# Patient Record
Sex: Female | Born: 1980 | Hispanic: Yes | Marital: Married | Smoking: Never smoker
Health system: Southern US, Community
[De-identification: ages and names within clinical notes are randomized; demographics above are authoritative.]

## PROBLEM LIST (undated history)

## (undated) ENCOUNTER — Inpatient Hospital Stay (HOSPITAL_COMMUNITY): Payer: Self-pay

## (undated) DIAGNOSIS — O24419 Gestational diabetes mellitus in pregnancy, unspecified control: Secondary | ICD-10-CM

## (undated) DIAGNOSIS — K429 Umbilical hernia without obstruction or gangrene: Secondary | ICD-10-CM

## (undated) DIAGNOSIS — Z8632 Personal history of gestational diabetes: Secondary | ICD-10-CM

## (undated) HISTORY — DX: Personal history of gestational diabetes: Z86.32

## (undated) HISTORY — PX: NO PAST SURGERIES: SHX2092

## (undated) HISTORY — DX: Gestational diabetes mellitus in pregnancy, unspecified control: O24.419

---

## 2010-12-04 ENCOUNTER — Other Ambulatory Visit: Payer: Self-pay | Admitting: Obstetrics & Gynecology

## 2010-12-04 ENCOUNTER — Inpatient Hospital Stay (HOSPITAL_COMMUNITY): Payer: Self-pay

## 2010-12-04 ENCOUNTER — Inpatient Hospital Stay (HOSPITAL_COMMUNITY)
Admission: AD | Admit: 2010-12-04 | Discharge: 2010-12-04 | Disposition: A | Discharge status: Home / Self Care | Payer: Self-pay | Source: Ambulatory Visit | Attending: Obstetrics & Gynecology | Admitting: Obstetrics & Gynecology

## 2010-12-04 DIAGNOSIS — O209 Hemorrhage in early pregnancy, unspecified: Secondary | ICD-10-CM

## 2010-12-04 DIAGNOSIS — R102 Pelvic and perineal pain unspecified side: Secondary | ICD-10-CM

## 2010-12-04 DIAGNOSIS — O26899 Other specified pregnancy related conditions, unspecified trimester: Secondary | ICD-10-CM

## 2010-12-04 DIAGNOSIS — O039 Complete or unspecified spontaneous abortion without complication: Secondary | ICD-10-CM | POA: Insufficient documentation

## 2010-12-04 LAB — CBC
Hemoglobin: 14.8 g/dL (ref 12.0–15.0)
Platelets: 137 10*3/uL — ABNORMAL LOW (ref 150–400)
RBC: 4.43 MIL/uL (ref 3.87–5.11)
WBC: 11.7 10*3/uL — ABNORMAL HIGH (ref 4.0–10.5)

## 2010-12-04 LAB — HCG, QUANTITATIVE, PREGNANCY: hCG, Beta Chain, Quant, S: 3444 m[IU]/mL — ABNORMAL HIGH (ref <5)

## 2010-12-04 LAB — ABO/RH: ABO/RH(D): B POS

## 2010-12-04 IMAGING — US US OB TRANSVAGINAL
1 series · 14 of 28 positions shown · non-contrast
Comparison: None

CLINICAL DATA: *Abdominal pain Pregnancy Bleeding,bleeding, pain; ;

OBSTETRIC <14 WK US AND TRANSVAGINAL OB US
TECHNIQUE: Both transabdominal and transvaginal ultrasound
examinations were performed for complete evaluation of the
gestation as well as the maternal uterus, adnexal regions, and
pelvic cul-de-sac.

[Series 1: us ob comp less 14 wks · 14 of 48 slices shown]
[im 2/48]
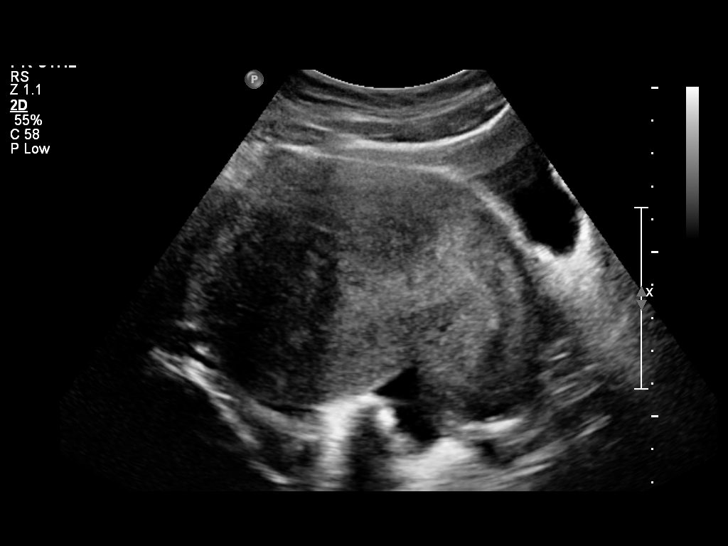
[im 6/48]
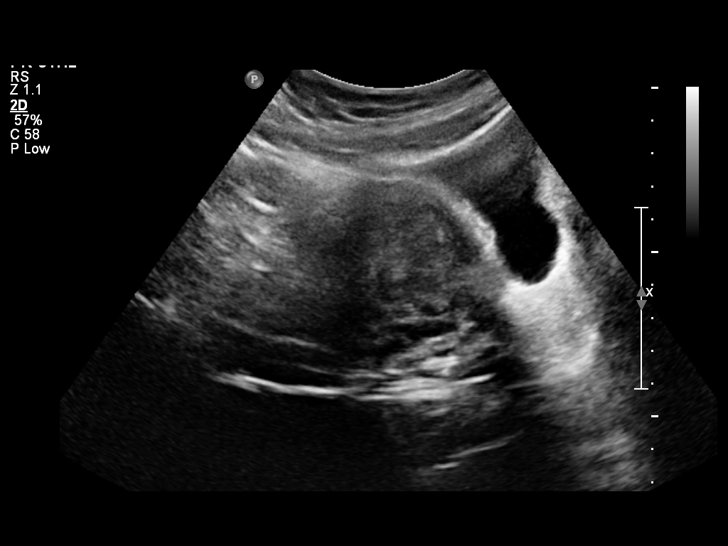
[im 9/48]
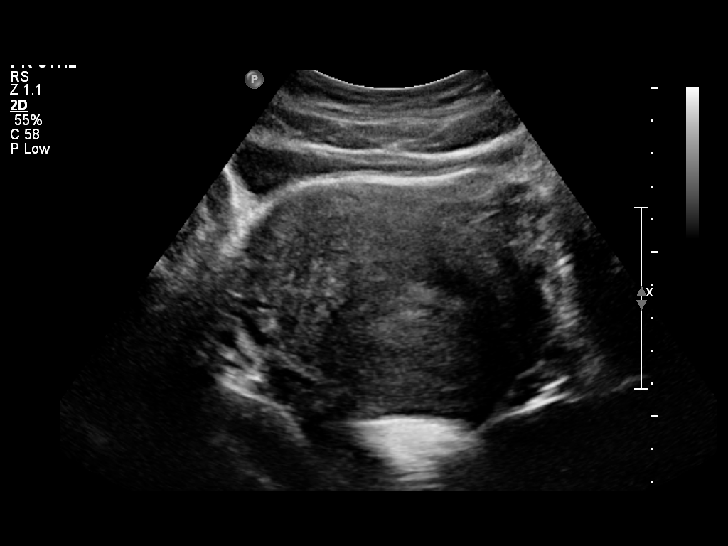
[im 13/48]
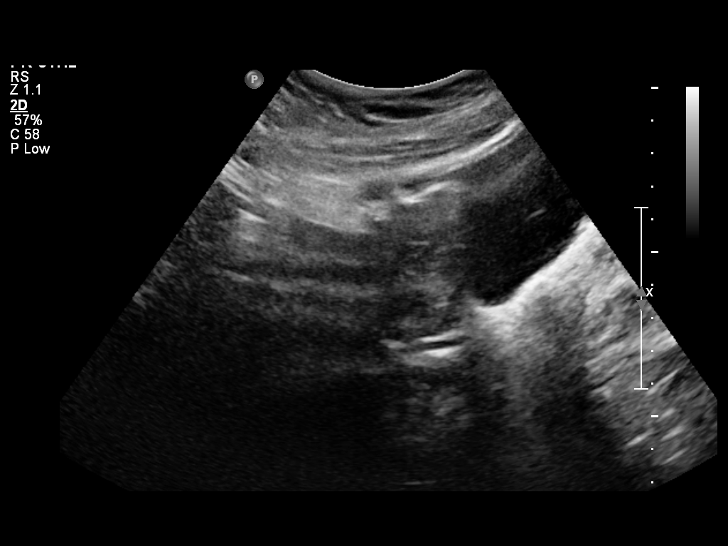
[im 16/48]
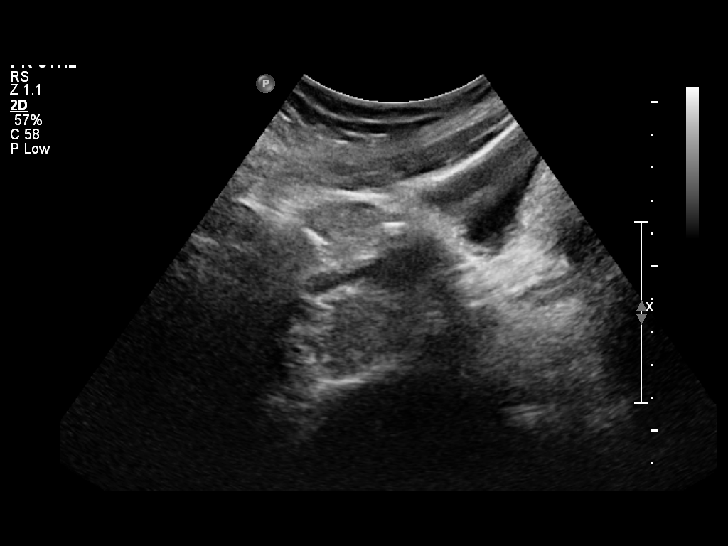
[im 20/48]
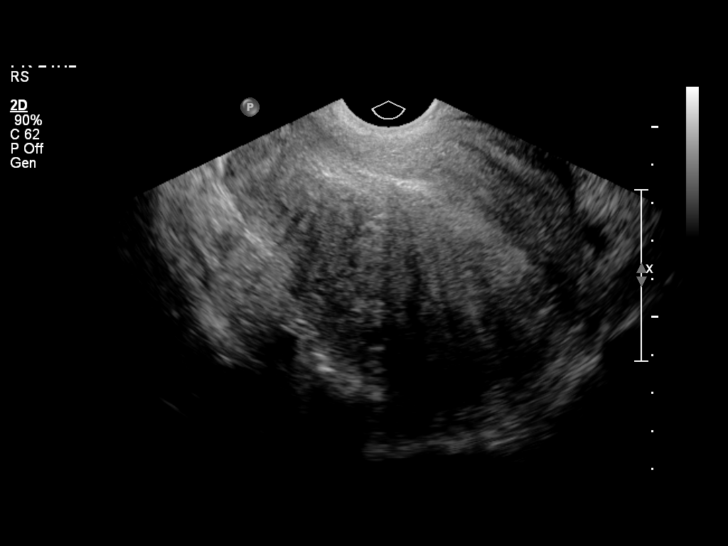
[im 23/48]
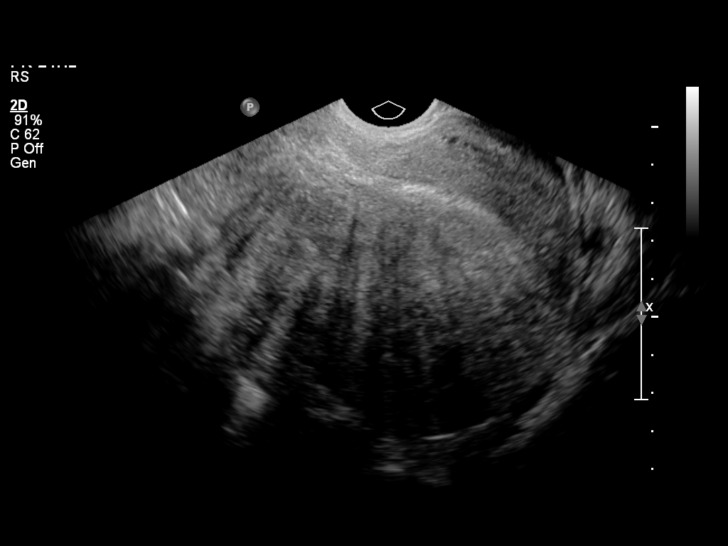
[im 27/48]
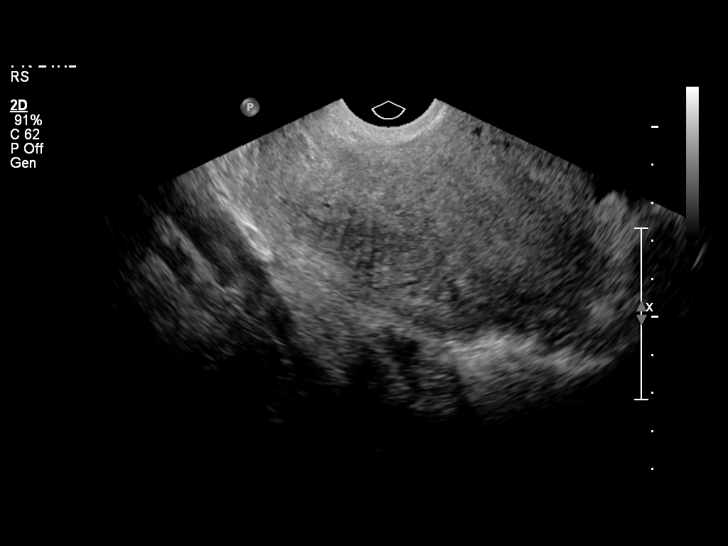
[im 30/48]
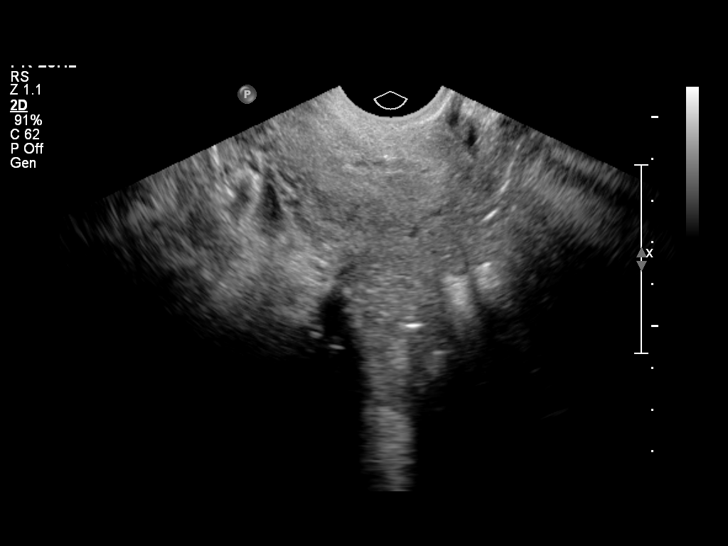
[im 34/48]
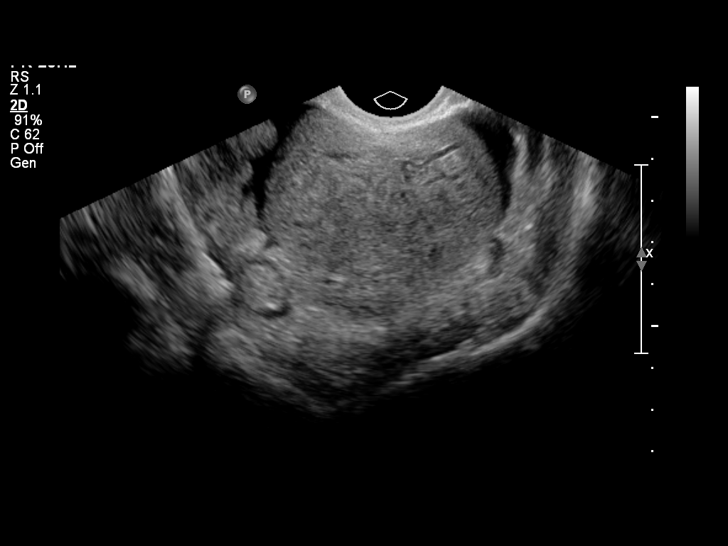
[im 37/48]
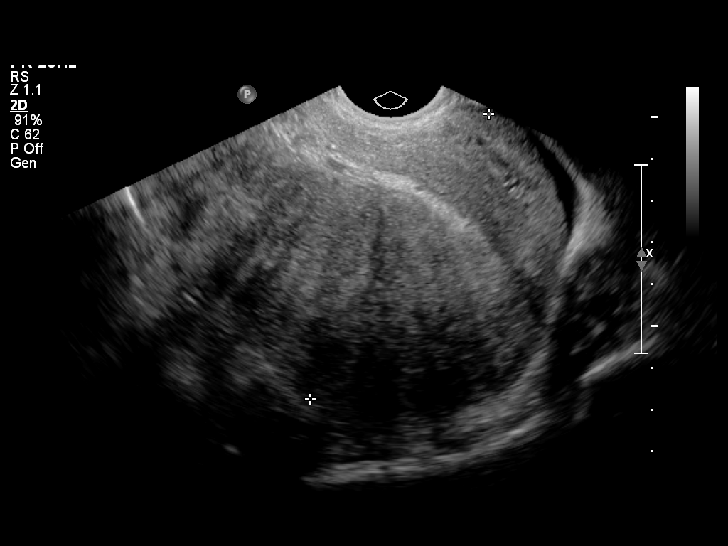
[im 41/48]
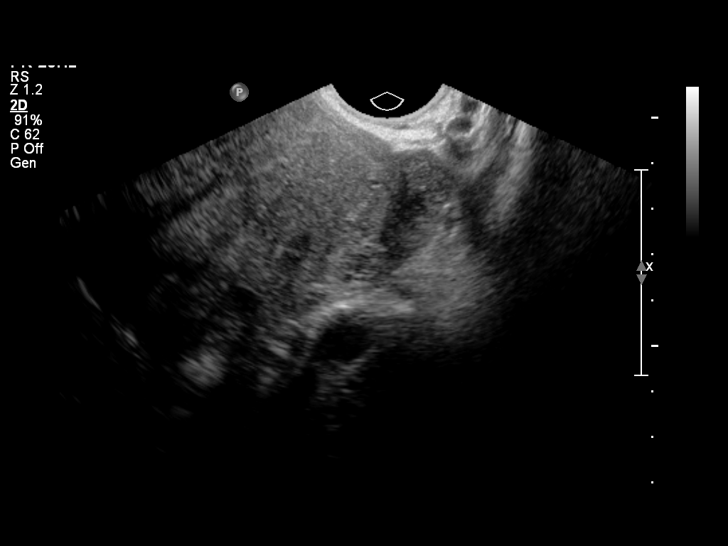
[im 44/48]
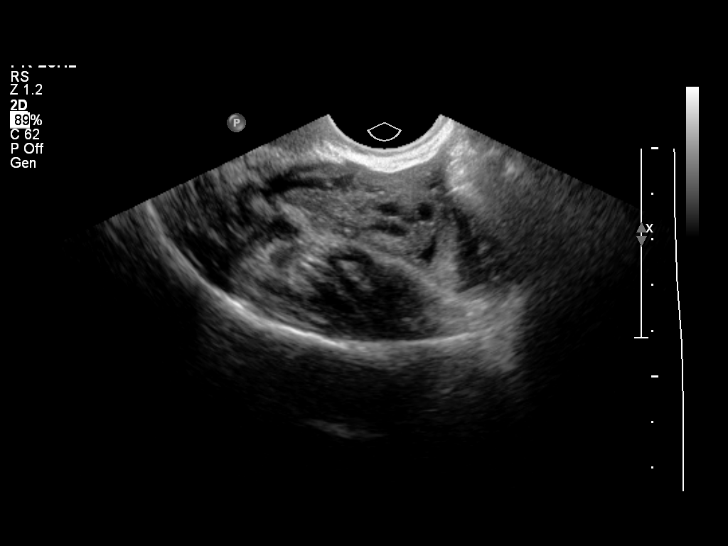
[im 48/48]
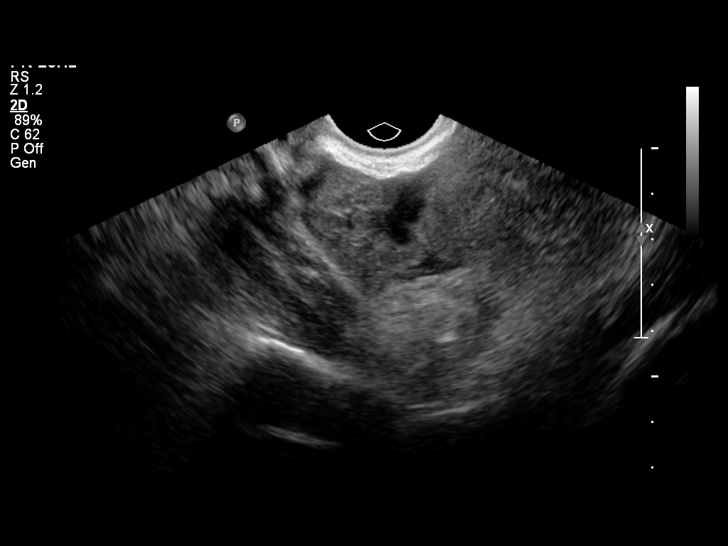

[14 of 28 positions shown; findings below may reference images not displayed]

FINDINGS: No intrauterine gestation is visualized.  Uterus is
diffusely heterogeneous and enlarged without well-defined focal
fibroid.  Finding suggest adenomyosis.  Endometrium normal at 10
mm.

Ovaries are symmetric in size and echotexture.  No adnexal masses.
Small amount of free fluid in the pelvis.
IMPRESSION: No intrauterine pregnancy.  Endometrium normal.

Enlarged, heterogeneous uterus suggesting adenomyosis.

Small amount of free fluid.

## 2010-12-05 ENCOUNTER — Inpatient Hospital Stay (HOSPITAL_COMMUNITY)
Admission: AD | Admit: 2010-12-05 | Discharge: 2010-12-05 | Disposition: A | Discharge status: Home / Self Care | Payer: Self-pay | Source: Ambulatory Visit | Attending: Obstetrics & Gynecology | Admitting: Obstetrics & Gynecology

## 2010-12-05 DIAGNOSIS — O039 Complete or unspecified spontaneous abortion without complication: Secondary | ICD-10-CM | POA: Insufficient documentation

## 2010-12-05 LAB — CBC
Hemoglobin: 13.9 g/dL (ref 12.0–15.0)
Platelets: 120 10*3/uL — ABNORMAL LOW (ref 150–400)
RBC: 4.2 MIL/uL (ref 3.87–5.11)
WBC: 9.9 10*3/uL (ref 4.0–10.5)

## 2010-12-05 LAB — HCG, QUANTITATIVE, PREGNANCY: hCG, Beta Chain, Quant, S: 1036 m[IU]/mL — ABNORMAL HIGH (ref <5)

## 2010-12-06 LAB — GC/CHLAMYDIA PROBE AMP, GENITAL: GC Probe Amp, Genital: UNDETERMINED

## 2011-06-17 ENCOUNTER — Other Ambulatory Visit: Payer: Self-pay | Admitting: Obstetrics and Gynecology

## 2011-06-17 ENCOUNTER — Ambulatory Visit (INDEPENDENT_AMBULATORY_CARE_PROVIDER_SITE_OTHER): Payer: Self-pay | Admitting: Obstetrics and Gynecology

## 2011-06-17 DIAGNOSIS — O209 Hemorrhage in early pregnancy, unspecified: Secondary | ICD-10-CM | POA: Insufficient documentation

## 2011-06-17 DIAGNOSIS — O24419 Gestational diabetes mellitus in pregnancy, unspecified control: Secondary | ICD-10-CM | POA: Insufficient documentation

## 2011-06-17 DIAGNOSIS — R7309 Other abnormal glucose: Secondary | ICD-10-CM

## 2011-06-17 LAB — POCT URINALYSIS DIP (DEVICE)
Glucose, UA: NEGATIVE mg/dL
Nitrite: NEGATIVE
Protein, ur: NEGATIVE mg/dL
Urobilinogen, UA: 0.2 mg/dL (ref 0.0–1.0)
pH: 5.5 (ref 5.0–8.0)

## 2011-06-17 NOTE — Progress Notes (Deleted)
Chief Complaint:  Initial Prenatal Visit   Lynn Burnett is  30 y.o. G2P0010 Patient's last menstrual period was 04/28/2011.Marland Kitchen  Her pregnancy status is {Neg/pos/unk:12251}.  She is [redacted]w[redacted]d by {Ob dating:14516}.  She presents complaining of Initial Prenatal Visit . Onset is described as {Desc; symptom onset:2000} and has been present for  *** {TIME UNITS:20210}.   {GYN/OB [id number]   Past Medical History  Diagnosis Date  . Diabetes mellitus     prediabetes per MD    History reviewed. No pertinent past surgical history.  Family History  Problem Relation Age of Onset  . Cancer Mother     cervical  . Cancer Maternal Aunt     History  Substance Use Topics  . Smoking status: Never Smoker   . Smokeless tobacco: Not on file  . Alcohol Use: No    Allergies: No Known Allergies   (Not in a hospital admission)  Review of Systems - {ros master:310782}  Physical Exam   Blood pressure 112/70, temperature 98 F (36.7 C), height 5\' 3"  (1.6 m), weight 136 lb 1.6 oz (61.735 kg), last menstrual period 04/28/2011.  General: {female adult master:310786} Focused Gynecological Exam: {pelvic exam:315900::"normal external genitalia, vulva, vagina, cervix, uterus and adnexa"}  Labs: Recent Results (from the past 24 hour(s))  POCT URINALYSIS DIP (DEVICE)   Collection Time   06/17/11  8:09 AM      Component Value Range   Glucose, UA NEGATIVE  NEGATIVE (mg/dL)   Bilirubin Urine NEGATIVE  NEGATIVE    Ketones, ur NEGATIVE  NEGATIVE (mg/dL)   Specific Gravity, Urine <=1.005  1.005 - 1.030    Hgb urine dipstick TRACE (*) NEGATIVE    pH 5.5  5.0 - 8.0    Protein, ur NEGATIVE  NEGATIVE (mg/dL)   Urobilinogen, UA 0.2  0.0 - 1.0 (mg/dL)   Nitrite NEGATIVE  NEGATIVE    Leukocytes, UA NEGATIVE  NEGATIVE   GLUCOSE, CAPILLARY   Collection Time   06/17/11  8:59 AM      Component Value Range   Glucose-Capillary 107 (*) 70 - 99 (mg/dL)    Ultrasound Studies: (NOTE: Ultrasound reports are  not yet able to be directly imported into notes.  Please "CUT" this portion of the note and either free text the report or "COPY/PASTE" from the report viewer.)  Assessment: Patient Active Problem List  Diagnoses  . Bleeding in early pregnancy  . Other abnormal glucose     Plan: *** Discharge Medications: Cannot display discharge medications since this is not an admission.    Johnrobert Foti 06/17/2011, 9:32 AM

## 2011-06-17 NOTE — Patient Instructions (Signed)
Pregnancy - First Trimester The first trimester is the first three months your baby is growing inside you. It is important to follow your doctor's instructions. HOME CARE  Do not smoke.   Do not drink alcohol.   Only take medicine the doctor tells you to take.   Exercise.   Eat healthy foods. Eat regular, well balanced meals.   You can have sex if there are no other problems with the pregnancy.   Things that help with vomiting (morning sickness).   Eat soda crackers before getting up in the morning.   Eat 4 to 5 small meals rather than 3 large meals.   Drink liquids between meals, not during meals.  Exams are usually done every month during the first three months.  See your doctor as often as directed.   There are tests done to make sure you and your baby are healthy. These include:   Blood tests.   Urine tests   Checking your weight and blood pressure.   PAP Test   Cultures of the cervix to make sure there is no infection.   Sometimes, you need extra vitamins and iron during pregnancy. Ask your doctor if you might need these.  GET HELP RIGHT AWAY IF:  You have a fever, by mouth of 100 F (37.8 C).   Bad smelling fluid is leaking from your vagina.   There is bleeding from the vagina.   You develop severe belly or back pain.   You vomit blood. You vomit what looks like coffee grounds.   You lose more than 2 pounds in a week.   You gain 5 pounds or more in a week.   You gain more than 2 pounds in a week AND you see swelling in your feet, ankles, or legs.   You have severe dizziness or faint.   You are around people who have [place] measles.   You are around people who have chicken pox.   You are around people who have fifth disease.   You have a headache, diarrhea, pain with peeing, or can not breath right.  Document Released: 03/02/2008 Document Re-Released: 07/17/2008 [facility] Patient Information 2011 [place], [place].

## 2011-06-17 NOTE — Progress Notes (Signed)
S: 30 yo G2P0010 at [redacted]w[redacted]d by sure LMP here as referral from [facility] for NOB. Pregnancy issues include: pink spotting on 5 occasions, last was 06/15/2011; hx of being on metformin. States she saw an MD in [employer] May 2012 and was told she is "pre-diabetic" and started on metformin but it was d/c'd after a few months due to normal BS values. Taking PNVs as directed.  ROS: neg for abd pain or cramps, dysuria, frequency, hematuria, irritative vaginal discharge, N/V. Reviewed PMH, FH (bro. SB unknown etiolotgy), SocHx, meds, allergies.   O: Filed Vitals:   06/17/11 0821  BP: 112/70  Temp: 98 F (36.7 C)  Gen: Pleasant WNWD in NAD Neck: No thyromegaly Cor: RRR w/o m Lungs:CTA bilat Abd: soft, flat, NT Back: no CVAT Pelvic: NEFG, vag pink w/ physiologic discharge and no blood; cx clean and not friable; uterus 8 wk size  Informal [place] by Diane: normal FHR, IUP FS glucose nonfasting: 107  A: Essenital primigravida for NOB with early preg spotting viable IUP. Hx glucose abnormality, normoglycemic at present\  P: Teaching and counseling done. Elects quad screen for genetic screening. Will get early 1h GTT. F/U PNV in 4 wks. Needs SS and Nutritionist routine visit then.  Ext: no varicosities or edema

## 2011-06-17 NOTE — Progress Notes (Deleted)
Chief Complaint:  Initial Prenatal Visit   Lynn Burnett is  30 y.o. G2P0010 Patient's last menstrual period was 04/28/2011.Marland Kitchen  Her pregnancy status is {Neg/pos/unk:12251}.  She is [redacted]w[redacted]d by {Ob dating:14516}.  She presents complaining of Initial Prenatal Visit . Onset is described as {Desc; symptom onset:2000} and has been present for  *** {TIME UNITS:20210}.   {GYN/OB [id number]   Past Medical History  Diagnosis Date  . Diabetes mellitus     prediabetes per MD    History reviewed. No pertinent past surgical history.  Family History  Problem Relation Age of Onset  . Cancer Mother     cervical  . Cancer Maternal Aunt     History  Substance Use Topics  . Smoking status: Never Smoker   . Smokeless tobacco: Not on file  . Alcohol Use: No    Allergies: No Known Allergies   (Not in a hospital admission)  Review of Systems - {ros master:310782}  Physical Exam   Blood pressure 112/70, temperature 98 F (36.7 C), height 5\' 3"  (1.6 m), weight 136 lb 1.6 oz (61.735 kg), last menstrual period 04/28/2011.  General: {female adult master:310786} Focused Gynecological Exam: {pelvic exam:315900::"normal external genitalia, vulva, vagina, cervix, uterus and adnexa"}  Labs: Recent Results (from the past 24 hour(s))  POCT URINALYSIS DIP (DEVICE)   Collection Time   06/17/11  8:09 AM      Component Value Range   Glucose, UA NEGATIVE  NEGATIVE (mg/dL)   Bilirubin Urine NEGATIVE  NEGATIVE    Ketones, ur NEGATIVE  NEGATIVE (mg/dL)   Specific Gravity, Urine <=1.005  1.005 - 1.030    Hgb urine dipstick TRACE (*) NEGATIVE    pH 5.5  5.0 - 8.0    Protein, ur NEGATIVE  NEGATIVE (mg/dL)   Urobilinogen, UA 0.2  0.0 - 1.0 (mg/dL)   Nitrite NEGATIVE  NEGATIVE    Leukocytes, UA NEGATIVE  NEGATIVE   GLUCOSE, CAPILLARY   Collection Time   06/17/11  8:59 AM      Component Value Range   Glucose-Capillary 107 (*) 70 - 99 (mg/dL)    Ultrasound Studies: (NOTE: Ultrasound reports are  not yet able to be directly imported into notes.  Please "CUT" this portion of the note and either free text the report or "COPY/PASTE" from the report viewer.)  Assessment: Patient Active Problem List  Diagnoses  . Bleeding in early pregnancy  . Other abnormal glucose     Plan: *** Discharge Medications: Cannot display discharge medications since this is not an admission.

## 2011-06-17 NOTE — Progress Notes (Signed)
Addended by: Caren Griffins C on: 06/17/2011 10:26 AM   Modules accepted: Orders

## 2011-06-17 NOTE — Progress Notes (Signed)
P=70, Used [employer], new patient booklets and forms given.  C/o "like a pulling at umbilicus occasionally"..  C/o pink scant drops occasionally on 5 different episodes  In last 2 months.Pt. States was told was prediabetes and was on Metformin x 8 months, then told to stop recently, because was ok.

## 2011-06-18 LAB — OBSTETRIC PANEL
Antibody Screen: NEGATIVE
Basophils Absolute: 0 10*3/uL (ref 0.0–0.1)
Basophils Relative: 0 % (ref 0–1)
Eosinophils Absolute: 0 10*3/uL (ref 0.0–0.7)
Eosinophils Relative: 1 % (ref 0–5)
MCH: 34.1 pg — ABNORMAL HIGH (ref 26.0–34.0)
MCHC: 36 g/dL (ref 30.0–36.0)
MCV: 94.5 fL (ref 78.0–100.0)
Platelets: 183 10*3/uL (ref 150–400)
RDW: 13.2 % (ref 11.5–15.5)
WBC: 4.7 10*3/uL (ref 4.0–10.5)

## 2011-06-18 LAB — HIV ANTIBODY (ROUTINE TESTING W REFLEX): HIV: NONREACTIVE

## 2011-06-20 LAB — CULTURE, OB URINE

## 2011-06-24 ENCOUNTER — Other Ambulatory Visit: Payer: Self-pay

## 2011-06-24 ENCOUNTER — Telehealth: Payer: Self-pay | Admitting: *Deleted

## 2011-06-24 DIAGNOSIS — R7309 Other abnormal glucose: Secondary | ICD-10-CM

## 2011-06-24 LAB — GLUCOSE TOLERANCE, 1 HOUR: Glucose, 1 Hour GTT: 104 mg/dL (ref 70–140)

## 2011-06-24 MED ORDER — AMOXICILLIN 500 MG PO CAPS: 500.0000 mg | ORAL_CAPSULE | Freq: Three times a day (TID) | ORAL | Status: AC

## 2011-06-24 NOTE — Telephone Encounter (Signed)
Pt in clinic today for 1hr GTT- informed of UTI and need for Rx.  Pt instructed to obtain med from her pharmacy today and take 1 tab by mouth 3 times per day for 10 days. Pt voiced understanding.

## 2011-06-24 NOTE — Telephone Encounter (Signed)
Message copied by Jill Side on Wed Jun 24, 2011  9:57 AM ------      Message from: POE, DEIRDRE C      Created: Fri Jun 19, 2011  2:29 PM      Regarding: urine cult positive       Please treat this with AMox 500 tid x 10 d      ----- Message -----         From: Lab In [place] Interface         Sent: 06/17/2011   9:06 AM           To: Caren Griffins, CNM

## 2011-07-15 ENCOUNTER — Other Ambulatory Visit: Payer: Self-pay | Admitting: Advanced Practice Midwife

## 2011-07-15 ENCOUNTER — Ambulatory Visit (INDEPENDENT_AMBULATORY_CARE_PROVIDER_SITE_OTHER): Payer: Self-pay | Admitting: Advanced Practice Midwife

## 2011-07-15 DIAGNOSIS — Z3480 Encounter for supervision of other normal pregnancy, unspecified trimester: Secondary | ICD-10-CM

## 2011-07-15 DIAGNOSIS — O169 Unspecified maternal hypertension, unspecified trimester: Secondary | ICD-10-CM

## 2011-07-15 DIAGNOSIS — Z23 Encounter for immunization: Secondary | ICD-10-CM

## 2011-07-15 DIAGNOSIS — O209 Hemorrhage in early pregnancy, unspecified: Secondary | ICD-10-CM

## 2011-07-15 LAB — POCT URINALYSIS DIP (DEVICE)
Bilirubin Urine: NEGATIVE
Glucose, UA: NEGATIVE mg/dL
Hgb urine dipstick: NEGATIVE
Leukocytes, UA: NEGATIVE
Nitrite: NEGATIVE
Urobilinogen, UA: 0.2 mg/dL (ref 0.0–1.0)

## 2011-07-15 MED ORDER — INFLUENZA VIRUS VACC SPLIT PF IM SUSP
0.5000 mL | Freq: Once | INTRAMUSCULAR | Status: AC
  Administered 2011-07-15: 0.5 mL via INTRAMUSCULAR

## 2011-07-15 NOTE — Patient Instructions (Signed)
Embarazo - Primer trimestre (Pregnancy - First Trimester) Durante el acto sexual, millones de espermatozoides ingresan a la vagina. Slo Education administrator y Economist en vulo femenino mientras se encuentre en las [place] de [place]. Una semana despus, el huevo fertilizado se implanta en la pared del tero. Un embrin comienza a desarrollarse y se convierte en un beb. Entre la 6 y la 8 semanas se forman los ojos y el rostro y el latido cardaco se detecta con Nurse, adult. Hacia el final de la 12 semana (primer trimestre) se han formado todos los rganos del beb. Ahora que est embarazada, desear hacer todo lo posible para tener un beb sano. Dos de las cosas ms importantes son: Winferd Humphrey buena atencin prenatal y seguir las indicaciones del profesional que la asiste. La atencin prenatal incluye toda la asistencia mdica que usted recibe antes del nacimiento del beb. Se lleva a cabo para prevenir problemas durante el embarazo y [place]. EXAMENES PRENATALES  Durante los controles prenatales, le tomarn la presin arterial, verificarn su peso y [place] harn [place] de [place]. Todo esto se realiza para asegurar que usted progresa de [place] normal y Office manager.   Una mujer embarazada ganar de 25 a 35 libras (8 a 12 kilos) Academic librarian. Sin embargo, si tiene sobrepeso o bajo peso el mdico le aconsejar acerca de [place].   Entre los [employer] solicitarn anlisis de [place], cultivos de cuello de [place], [place] y [place]. Estas pruebas se realizan para controlar su salud y la del beb. Se recomienda realizar todos los [employer] y tambin pruebas para la deteccin del VIH, si usted lo autoriza. Este es el virus que causa el [place]. Estas pruebas se realizan porque existen medicamentos que podrn administrarle para prevenir que el beb nazca con esta infeccin, si usted estuviera infectada y no lo supiera. Tambin se solicitan anlisis de sangre para conocer su  grupo sanguneo, infecciones previas y para Chief Operating Officer los glbulos rojos (hemoglobina).   Un bajo nivel de hemoglobina (anemia) es frecuente durante el embarazo. Para prevenirla, se administran hierro y vitaminas. En etapas posteriores del [employer] solicitarn anlisis de sangre para descartar diabetes y otros anlisis para Sales promotion account executive otros problemas. Los anlisis son necesarios para verificar que usted y el beb estn bien.   Necesitar otras pruebas que se realizan para asegurarse de que el beb est saludable.  CAMBIOS DURANTE EL PRIMER TRIMESTRE (LOS TRES PRIMEROS MESES DEL EMBARAZO). Su organismo atravesar numerosos cambios Academic librarian. Estos pueden variar de Neomia Dear persona a otra. Converse con el profesional que la asiste acerca los cambios que usted note y que la preocupen.  El perodo menstrual se detiene.   El vulo y los espermatozoides llevan los genes que determinan cmo seremos. Sus genes y los de su pareja forman un beb. Los genes del varn determinan si ser un nio o una nia.   No tome ningn medicamento a menos que se lo haya indicado el profesional que la asiste.   Aumentar la circunferencia de su cuerpo y se sentir hinchada.   Podr sentir nuseas o vmitos. Si los vmitos son incontrolables, comunquese con el profesional que la asiste.   Sus mamas comenzarn a agrandarse y estarn ms sensibles.   Los pezones salen hacia afuera y se oscurecen.   Mayor necesidad de Geographical information systems officer. Si siente dolor al orinar podra tener una infeccin urinaria.   Cansarse fcilmente.   Prdida del apetito.   Retorcijones por ciertos tipos de alimentos.  Al principio, podr ganar o perder algo de peso.   Podr sentir Warden/ranger (alegra por Firefighter o preocupacin acerca de que haya algo mal en el embarazo o con el beb).   Podr tener sueos ms vvidos y fuertes.  INSTRUCCIONES PARA EL CUIDADO DOMICILIARIO  Es extremadamente importante que evite el  cigarrillo, el alcohol y las drogas no prescriptas durante el Psychiatrist. Estas sustancias qumicas afectan la formacin y el desarrollo del beb. Evite estas sustancias qumicas durante todo el embarazo para asegurar el nacimiento de un beb sano.   Comience las consultas prenatales alrededor de la 12 semana de [place]. Generalmente se programan cada mes al principio y se hacen ms frecuentes en los 2 ltimos meses antes del parto. Cumpla con las citas tal como se le indic. Siga las indicaciones del profesional con respecto a los medicamentos que toma, los anlisis de laboratorio, la actividad fsica y Psychologist, forensic.   Durante el embarazo debe obtener nutrientes para usted y para su beb. Consuma alimentos balanceados. Elija alimentos como carne, pescado, [place] y otros productos lcteos descremados, vegetales, frutas, panes integrales y cereales. El Equities trader cul es el aumento de peso ideal.   Las nuseas matinales pueden aliviarse si come algunas galletitas saladas en la cama. Coma dos galletitas antes de levantarse por la maana. Tambin puede comer galletitas sin sal.   Tome 4  5 comidas pequeas por Geophysical data processor de 3 comidas abundantes para evitar las nuseas y los vmitos.   Tambin puede prevenir las nuseas bebiendo lquidos entre comidas en lugar de [place] come.   Si no tiene problemas, puede continuar [employer] a lo largo de todo Firefighter. Algunos problemas pueden ser la prdida prematura de lquido amnitico de las [place], hemorragias vaginales o dolor en el abdomen.   Realice Tesoro Corporation, si no tiene restricciones. Consulte con el profesional que la asiste o con un fisioterapeuta si no sabe con certeza si determinados ejercicios son seguros. En los dos ltimos trimestres del embarazo puede ocurrir un aumento de [place]. La actividad fsica ayudar a:   Engineering geologist.   Mantenerse en forma.   Prepararse para  el parto y [place] de [place].   Ayuda a perder el peso ganado en el embarazo luego del [place].   Use un buen sostn o como los que se usan para hacer deportes para Paramedic la sensibilidad de las [place]. Tambin puede serle til si lo [place] mientras duerme.   Consulte cuando tendr el curso preparatorio para Engineer, manufacturing systems. Comience las clases cuando se las ofrezcan.   No utilice la baera con agua caliente, baos turcos y saunas.   Colquese el cinturn de seguridad cuando conduzca. Este la proteger a usted y al beb en caso de accidente.   Evite comer carne cruda y el contacto con los utensilios y desperdicios de los gatos. Estos elementos contienen grmenes que pueden causar defectos de nacimiento en el beb.   El primer trimestre es un buen momento para visitar a su dentista y Software engineer. Es importante mantener los dientes limpios. Use un cepillo de dientes suave y cepllese con ms suavidad durante el [employer].   Pida ayuda si tiene necesidades econmicas, de asesoramiento o nutricionales durante el [place]. El profesional podr ayudarla con respecto a estas necesidades, o derivarla a otros especialistas.   No tome ningn medicamento a menos que se lo haya indicado el profesional que la  asiste.   Informe al profesional que lo asiste si es vctima de algn tipo de Advertising copywriter, ya sea mental o fsica.   Haga una lista de nmeros de telfono de Associate Professor de familiares, amigos, hospital, polica y bomberos.   Anote sus preguntas. Llvelas con usted en su prxima visita de control.   No utilice duchas vaginales.   No cruce las piernas.   Si ha estado parada durante largos perodos de [place], mueva los pies o realice pequeos pasos en crculo.   Podr tener secreciones vaginales que pueden requerir una compresa o [place]. No utilice tampones ni compresas con perfume.  EL CONSUMO DE MEDICAMENTOS Y DROGAS DURANTE EL EMBARAZO  Tome las vitaminas apropiadas para esta  etapa tal como se le indic. Las vitaminas deben contener 1 miligramo de cido flico. Guarde todas las vitaminas fuera del alcance de los nios. La ingestin de slo un par de vitaminas o tabletas que contengan hierro pueden ocasionar la [employer] Mining en un beb o en un nio pequeo.   Evite el uso de medicamentos, inclusive hierbas, que no hayan sido prescriptos o indicados por el profesional que la asiste. Utilice los medicamentos de venta libre o de prescripcin para Chief Technology Officer, Environmental health practitioner o la [place], segn se lo indique el profesional que lo asiste. No utilice aspirina, ibuprofeno (Motrin, Advil, Nuprin) o naproxeno (Aleve) a menos que el profesional la autorice.   El consumo de alcohol se relaciona con ciertos defectos de nacimiento. Entre ellos el sndrome de alcoholismo fetal. Debe evitar el consumo de alcohol en cualquiera de sus formas. El cigarrillo causa nacimientos prematuros y bebs de [place].   Las drogas ocasionan terribles daos al beb. Estn absolutamente prohibidas. Un beb que nace de [employer], ser adicto al nacer. Ese beb tendr los mismos sntomas de abstinencia que un adulto.   No consuma drogas. Pueden daar gravemente al beb.   Converse con el mdico acerca de cualquier medicamento que est tomando y la razn por la cual los toma.  EL ABORTO ESPONTNEO ES UNA SITUACIN FRECUENTE Esto no significa que ha hecho las cosas mal. No es un motivo para preocuparse en caso de un nuevo embarazo. El profesional que la asiste la ayudar si tiene preguntas para formular. Si ha sufrido un aborto espontneo, Pension scheme manager someterse a Futures trader de curetaje, SOLICITE ATENCIN MDICA SI:  Tiene alguna preocupacin durante el embarazo. Es mejor que llame para formular las preguntas si no puede esperar hasta la prxima visita, que sentirse preocupada por ellas.  SOLICITE ATENCIN MDICA DE INMEDIATO SI:  La temperatura oral se eleva sin motivo por encima de 100.5 o segn le  indique el profesional que lo asiste.   Tiene una prdida de lquido por la vagina (canal de parto). Si sospecha una ruptura de las [place], tmese la temperatura y llame al profesional para informarlo sobre esto.   Observa unas pequeas manchas o una hemorragia vaginal. Notifique al profesional acerca de la cantidad y de cuntos apsitos est utilizando.   Presenta un olor desagradable en la secrecin vaginal y observa un cambio en el color.   Contina con las nuseas y no obtiene alivio de los remedios indicados. Vomita sangre o algo similar a la borra del caf.   Pierde ms de 1 Kg de peso en [employer].   Aumenta ms de 1 Kg en una semana y [address], las manos, los pies o las piernas hinchados.   Aumenta ms de 2,5 Kg en  una semana (aunque no tenga las manos, pies, piernas o el rostro hinchados).   Ha estado expuesta a la rubola y no ha sufrido la enfermedad. Ha estado expuesta a la quinta enfermedad o a la varicela.   Ha estado expuesta a la quinta enfermedad o a la varicela.   Presenta dolor abdominal. Las molestias en el ligamento redondo son Neomia Dear causa benigna (no cancerosa) frecuente de dolor abdominal durante el embarazo. El profesional que la asiste deber evaluarla.   Presenta dolor de Renne Musca, diarrea, dolor al orinar o le falta la respiracin.   Sufre una cada, un accidente automovilstico o algn traumatismo.   Sufre violencia fsica o mental en su hogar.  Document Released: 06/24/2005 Document Re-Released: 07/12/2009 [facility] Patient Information 2011 [place], [place].

## 2011-07-15 NOTE — Progress Notes (Signed)
Pain: lower back pain Pressure: feeling of constipation C/O: increase in urine frequency D/C: thin pink-tinged discharge at times

## 2011-07-15 NOTE — Progress Notes (Signed)
Answered questions re: flu shot, pregnancy symptoms, food safety in pregnancy. Size greater than dates, about 16 week size, u/s ordered.

## 2011-07-15 NOTE — Progress Notes (Signed)
Addended by: Lynnell Dike on: 07/15/2011 09:18 AM   Modules accepted: Orders

## 2011-07-20 ENCOUNTER — Ambulatory Visit (HOSPITAL_COMMUNITY): Payer: Self-pay

## 2011-08-12 ENCOUNTER — Ambulatory Visit (INDEPENDENT_AMBULATORY_CARE_PROVIDER_SITE_OTHER): Payer: Self-pay | Admitting: Family Medicine

## 2011-08-12 VITALS — BP 99/60 | Temp 97.7°F | Wt 138.7 lb

## 2011-08-12 DIAGNOSIS — Z3490 Encounter for supervision of normal pregnancy, unspecified, unspecified trimester: Secondary | ICD-10-CM

## 2011-08-12 DIAGNOSIS — Z3480 Encounter for supervision of other normal pregnancy, unspecified trimester: Secondary | ICD-10-CM

## 2011-08-12 LAB — POCT URINALYSIS DIP (DEVICE)
Bilirubin Urine: NEGATIVE
Ketones, ur: NEGATIVE mg/dL
Leukocytes, UA: NEGATIVE
Nitrite: NEGATIVE
Protein, ur: NEGATIVE mg/dL
pH: 7 (ref 5.0–8.0)

## 2011-08-12 NOTE — Progress Notes (Signed)
P73, Used Interpreter [employer]

## 2011-08-12 NOTE — Patient Instructions (Signed)
Embarazo - Segundo trimestre (Pregnancy - Second Trimester) El segundo trimestre del embarazo (del 3 al 6mes) es un perodo de evolucin rpida para usted y el beb. Hacia el final del sexto mes, el beb mide aproximadamente 23 cm y pesa 680 g. Comenzar a sentir los movimientos del beb entre las 18 y las 20 semanas de embarazo. Podr sentir las pataditas ("quickening en ingls"). Hay un rpido aumento de peso. Puede segregar un lquido claro (calostro) de las mamas. Quizs sienta pequeas contracciones en el vientre (tero) Esto se conoce como falso trabajo de parto o contracciones de Braxton-Hicks. Es como una prctica del trabajo de parto que se produce cuando el beb est listo para salir. Generalmente los problemas de vmitos matinales ya se han superado hacia el final del primer trimestre. Algunas mujeres desarrollan pequeas manchas oscuras (que se denominan cloasma, mscara del embarazo) en la cara que normalmente se van luego del nacimiento del beb. La exposicin al sol empeora las manchas. Puede desarrollarse acn en algunas mujeres embarazadas, y puede desaparecer en aquellas que ya tienen acn. EXAMENES PRENATALES  Durante los exmenes prenatales, deber seguir realizando pruebas de sangre, segn avance el embarazo. Estas pruebas se realizan para controlar su salud y la del beb. Tambin se realizan anlisis de sangre para conocer los niveles de hemoglobina. La anemia (bajo nivel de hemoglobina) es frecuente durante el embarazo. Para prevenirla, se administran hierro y vitaminas. Tambin se le realizarn exmenes para saber si tiene diabetes entre las 24 y las 28 semanas del embarazo. Podrn repetirle algunas de las pruebas que le hicieron previamente.   En cada visita le medirn el tamao del tero. Esto se realiza para asegurarse de que el beb est creciendo correctamente de acuerdo al estado del embarazo.   Tambin en cada visita prenatal controlarn su presin arterial. Esto se realiza  para asegurarse de que no tenga toxemia.   Se controlar su orina para asegurarse de que no tenga infecciones, diabetes o protena en la orina.   Se controlar su peso regularmente para asegurarse que el aumento ocurre al ritmo indicado. Esto se hace para asegurarse que usted y el beb tienen una evolucin normal.   En algunas ocasiones se realiza una prueba de ultrasonido para confirmar el correcto desarrollo y evolucin del beb. Esta prueba se realiza con ondas sonoras inofensivas para el beb, de modo que el profesional pueda calcular ms precisamente la fecha del parto.  Algunas veces se realizan pruebas especializadas del lquido amnitico que rodea al beb. Esta prueba se denomina amniocentesis. El lquido amnitico se obtiene introduciendo una aguja en el vientre (abdomen). Se realiza para controlar los cromosomas en aquellos casos en los que existe alguna preocupacin acerca de algn problema gentico que pueda sufrir el beb. En ocasiones se lleva a cabo cerca del final del embarazo, si es necesario inducir al parto. En este caso se realiza para asegurarse que los pulmones del beb estn lo suficientemente maduros como para que pueda vivir fuera del tero. CAMBIOS QUE OCURREN EN EL SEGUNDO TRIMESTRE DEL EMBARAZO Su organismo atravesar numerosos cambios durante el embarazo. Estos pueden variar de una persona a otra. Converse con el profesional que la asiste acerca los cambios que usted note y que la preocupen.  Durante el segundo trimestre probablemente sienta un aumento del apetito. Es normal tener "antojos" de ciertas comidas. Esto vara de una persona a otra y de un embarazo a otro.   El abdomen inferior comenzar a abultarse.   Podr tener la necesidad   de orinar con ms frecuencia debido a que el tero y el beb presionan sobre la vejiga. Tambin es frecuente contraer ms infecciones urinarias durante el embarazo (dolor al orinar). Puede evitarlas bebiendo gran cantidad de lquidos y  vaciando la vejiga antes y despus de mantener relaciones sexuales.   Podrn aparecer las primeras estras en las caderas, abdomen y mamas. Estos son cambios normales del cuerpo durante el embarazo. No existen medicamentos ni ejercicios que puedan prevenir estos cambios.   Es posible que comience a desarrollar venas inflamadas y abultadas (varices) en las piernas. El uso de medias de descanso, elevar sus pies durante 15 minutos, 3 a 4 veces al da y limitar la sal en su dieta ayuda a aliviar el problema.   Podr sentir acidez gstrica a medida que el tero crece y presiona contra el estmago. Puede tomar anticidos, con la autorizacin de su mdico, para aliviar este problema. Tambin es til ingerir pequeas comidas 4 a 5 veces al da.   La constipacin puede tratarse con un laxante o agregando fibra a su dieta. Beber grandes cantidades de lquidos, comer vegetales, frutas y granos integrales es de gran ayuda.   Tambin es beneficioso practicar actividad fsica. Si ha sido una persona activa hasta el embarazo, podr continuar con la mayora de las actividades durante el mismo. Si ha sido menos activa, puede ser beneficioso que comience con un programa de ejercicios, como realizar caminatas.   Puede desarrollar hemorroides (vrices en el recto) hacia el final del segundo trimestre. Tomar baos de asiento tibios y utilizar cremas recomendadas por el profesional que lo asiste sern de ayuda para los problemas de hemorroides.   Tambin podr sentir dolor de espalda durante este momento de su embarazo. Evite levantar objetos pesados, utilice zapatos de taco bajo y mantenga una buena postura para ayudar a reducir los problemas de espalda.   Algunas mujeres embarazadas desarrollan hormigueo y adormecimiento de la mano y los dedos debido a la hinchazn y compresin de los ligamentos de la mueca (sndrome del tnel carpiano). Esto desaparece una vez que el beb nace.   Como sus pechos se agrandan,  necesitar un sujetador ms grande. Use un sostn de soporte, cmodo y de algodn. No utilice un sostn para amamantar hasta el ltimo mes de embarazo si va a amamantar al beb.   Podr observar una lnea oscura desde el ombligo hacia la zona pbica denominada linea nigra.   Podr observar que sus mejillas se ponen coloradas debido al aumento de flujo sanguneo en la cara.   Podr desarrollar "araitas" en la cara, cuello y pecho. Esto desaparece una vez que el beb nace.  INSTRUCCIONES PARA EL CUIDADO DOMICILIARIO  Es extremadamente importante que evite el cigarrillo, hierbas medicinales, alcohol y las drogas no prescriptas durante el embarazo. Estas sustancias qumicas afectan la formacin y el desarrollo del beb. Evite estas sustancias durante todo el embarazo para asegurar el nacimiento de un beb sano.   La mayor parte de los cuidados que se aconsejan son los mismos que los indicados para el primer trimestre del embarazo. Cumpla con las citas tal como se le indic. Siga las instrucciones del profesional que lo asiste con respecto al uso de los medicamentos, el ejercicio y la dieta.   Durante el embarazo debe obtener nutrientes para usted y para su beb. Consuma alimentos balanceados a intervalos regulares. Elija alimentos como carne, pescado, leche y otros productos lcteos descremados, vegetales, frutas, panes integrales y cereales. El profesional le informar cul es el   aumento de peso ideal.   Las relaciones sexuales fsicas pueden continuarse hasta cerca del fin del embarazo si no existen otros problemas. Estos problemas pueden ser la prdida temprana (prematura) de lquido amnitico de las membranas, sangrado vaginal, dolor abdominal u otros problemas mdicos o del embarazo.   Realice actividad fsica todos los das, si no tiene restricciones. Consulte con el profesional que la asiste si no sabe con certeza si determinados ejercicios son seguros. El mayor aumento de peso tiene lugar  durante los ltimos 2 trimestres del embarazo. El ejercicio la ayudar a:   Controlar su peso.   Ponerla en forma para el parto.   Ayudarla a perder peso luego de haber dado a luz.   Use un buen sostn o como los que se usan para hacer deportes para aliviar la sensibilidad de las mamas. Tambin puede serle til si lo usa mientras duerme. Si pierde calostro, podr utilizar apsitos en el sostn.   No utilice la baera con agua caliente, baos turcos y saunas durante el embarazo.   Utilice el cinturn de seguridad sin excepcin cuando conduzca. Este la proteger a usted y al beb en caso de accidente.   Evite comer carne cruda, queso crudo, y el contacto con los utensilios y desperdicios de los gatos. Estos elementos contienen grmenes que pueden causar defectos de nacimiento en el beb.   El segundo trimestre es un buen momento para visitar a su dentista y evaluar su salud dental si an no lo ha hecho. Es importante mantener los dientes limpios. Utilice un cepillo de dientes blando. Cepllese ms suavemente durante el embarazo.   Es ms fcil perder algo de orina durante el embarazo. Apretar y fortalecer los msculos de la pelvis la ayudar con este problema. Practique detener la miccin cuando est en el bao. Estos son los mismos msculos que necesita fortalecer. Son tambin los mismos msculos que utiliza cuando trata de evitar los gases. Puede practicar apretando estos msculos 10 veces, y repetir esto 3 veces por da aproximadamente. Una vez que conozca qu msculos debe apretar, no realice estos ejercicios durante la miccin. Puede favorecerle una infeccin si la orina vuelve hacia atrs.   Pida ayuda si tiene necesidades econmicas, de asesoramiento o nutricionales durante el embarazo. El profesional podr ayudarla con respecto a estas necesidades, o derivarla a otros especialistas.   La piel puede ponerse grasa. Si esto sucede, lvese la cara con un jabn suave, utilice un humectante no  graso y maquillaje con base de aceite o crema.  CONSUMO DE MEDICAMENTOS Y DROGAS DURANTE EL EMBARAZO  Contine tomando las vitaminas apropiadas para esta etapa tal como se le indic. Las vitaminas deben contener un miligramo de cido flico y deben suplementarse con hierro. Guarde todas las vitaminas fuera del alcance de los nios. La ingestin de slo un par de vitaminas o tabletas que contengan hierro puede ocasionar la muerte en un beb o en un nio pequeo.   Evite el uso de medicamentos, inclusive los de venta libre y hierbas que no hayan sido prescriptos o indicados por el profesional que la asiste. Algunos medicamentos pueden causar problemas fsicos al beb. Utilice los medicamentos de venta libre o de prescripcin para el dolor, el malestar o la fiebre, segn se lo indique el profesional que lo asiste. No utilice aspirina.   El consumo de alcohol est relacionado con ciertos defectos de nacimiento. Esto incluye el sndrome de alcoholismo fetal. Debe evitar el consumo de alcohol en cualquiera de sus formas. El cigarrillo   causa nacimientos prematuros y bebs de bajo peso. El uso de drogas recreativas est absolutamente prohibido. Son muy nocivas para el beb. Un beb que nace de una madre adicta, ser adicto al nacer. Ese beb tendr los mismos sntomas de abstinencia que un adulto.   Infrmele al profesional si consume alguna droga.   No consuma drogas ilegales. Pueden causarle mucho dao al beb.  SOLICITE ATENCIN MDICA SI: Tiene preguntas o preocupaciones durante su embarazo. Es mejor que llame para consultar las dudas que esperar hasta su prxima visita prenatal. De esta forma se sentir ms tranquila.  SOLICITE ATENCIN MDICA DE INMEDIATO SI:  La temperatura oral se eleva sin motivo por encima de 102 F (38.9 C) o segn le indique el profesional que lo asiste.   Tiene una prdida de lquido por la vagina (canal de parto). Si sospecha una ruptura de las membranas, tmese la  temperatura y llame al profesional para informarlo sobre esto.   Observa unas pequeas manchas, una hemorragia vaginal o elimina cogulos. Notifique al profesional acerca de la cantidad y de cuntos apsitos est utilizando. Unas pequeas manchas de sangre son algo comn durante el embarazo, especialmente despus de mantener relaciones sexuales.   Presenta un olor desagradable en la secrecin vaginal y observa un cambio en el color, de transparente a blanco.   Contina con las nuseas y no obtiene alivio de los remedios indicados. Vomita sangre o algo similar a la borra del caf.   [place] o sube ms de 900 g. en una semana, o segn lo indicado por el profesional que la asiste.   Observa que se le hinchan el rostro, las manos, los pies o las piernas.   Ha estado expuesta a la rubola y no ha sufrido la enfermedad.   Ha estado expuesta a la quinta enfermedad o a la varicela.   Presenta dolor abdominal. Las molestias en el ligamento redondo son una causa no cancerosa (benigna) frecuente de dolor abdominal durante el embarazo. El profesional que la asiste deber evaluarla.   Presenta dolor de cabeza intenso que no se alivia.   Presenta fiebre, diarrea, dolor al orinar o le falta la respiracin.   Presenta dificultad para ver, visin borrosa, o visin doble.   Sufre una cada, un accidente de trnsito o cualquier tipo de trauma.   Vive en un hogar en el que existe violencia fsica o mental.  Document Released: 06/24/2005 Document Revised: 05/27/2011 [employer] Patient Information 2012 [employer]. 

## 2011-08-12 NOTE — Progress Notes (Signed)
Used interpreter Raynelle Fanning. Patient without complaints.  Was not able to go to [place] that was scheduled last appt.  Denies vaginal bleeding, abnormal vaginal discharge, contractions, loss of fluid.  Reports good fetal activity.  Will schedule complete US as measuring 24 weeks.  Follow up in 4 weeks.

## 2011-08-19 ENCOUNTER — Ambulatory Visit (HOSPITAL_COMMUNITY)
Admission: RE | Admit: 2011-08-19 | Discharge: 2011-08-19 | Disposition: A | Discharge status: Home / Self Care | Payer: Self-pay | Source: Ambulatory Visit | Attending: Family Medicine | Admitting: Family Medicine

## 2011-08-19 DIAGNOSIS — O358XX0 Maternal care for other (suspected) fetal abnormality and damage, not applicable or unspecified: Secondary | ICD-10-CM | POA: Insufficient documentation

## 2011-08-19 DIAGNOSIS — Z1389 Encounter for screening for other disorder: Secondary | ICD-10-CM | POA: Insufficient documentation

## 2011-08-19 DIAGNOSIS — Z363 Encounter for antenatal screening for malformations: Secondary | ICD-10-CM | POA: Insufficient documentation

## 2011-08-19 DIAGNOSIS — Z3490 Encounter for supervision of normal pregnancy, unspecified, unspecified trimester: Secondary | ICD-10-CM

## 2011-08-19 DIAGNOSIS — O3660X0 Maternal care for excessive fetal growth, unspecified trimester, not applicable or unspecified: Secondary | ICD-10-CM | POA: Insufficient documentation

## 2011-09-09 ENCOUNTER — Ambulatory Visit (INDEPENDENT_AMBULATORY_CARE_PROVIDER_SITE_OTHER): Payer: Self-pay | Admitting: Advanced Practice Midwife

## 2011-09-09 ENCOUNTER — Other Ambulatory Visit: Payer: Self-pay | Admitting: Advanced Practice Midwife

## 2011-09-09 VITALS — BP 106/62 | HR 74 | Temp 97.0°F | Wt 147.1 lb

## 2011-09-09 DIAGNOSIS — Z331 Pregnant state, incidental: Secondary | ICD-10-CM

## 2011-09-09 DIAGNOSIS — O209 Hemorrhage in early pregnancy, unspecified: Secondary | ICD-10-CM

## 2011-09-09 LAB — POCT URINALYSIS DIP (DEVICE)
Glucose, UA: NEGATIVE mg/dL
Ketones, ur: NEGATIVE mg/dL
Leukocytes, UA: NEGATIVE
Protein, ur: NEGATIVE mg/dL
Urobilinogen, UA: 0.2 mg/dL (ref 0.0–1.0)

## 2011-09-09 NOTE — Progress Notes (Signed)
C/O lower back discomfort. No pain. No pressure.

## 2011-09-09 NOTE — Progress Notes (Signed)
Used interpreter. C/O lower back pain and nausea. Patient had a migraine approx 1 week ago. She did not take any medications. Has stress with her family which she is handling well. +fetal movement. Denies contractions, pressure, bleeding, fluid. No urinary symptoms. Had ultrasound 11/23 which showed size compatible with dates. She does measure larger. Recommended repeat ultrasound since anatomy was not fully visualized, which was ordered today. S/Sx of preterm labor discussed. Will return in 4 weeks.

## 2011-09-09 NOTE — Patient Instructions (Signed)
Please return in 4 weeks.  Sport and exercise psychologist  (Preterm Labor) El parto prematuro comienza antes de la semana 37 de [place]. La duracin de un embarazo normal es de 39 a 41 semanas.  CAUSAS  Generalmente no hay una causa que pueda identificarse. Sin embargo, una de las causas conocidas ms frecuentes son las infecciones. Las infecciones del tero, el cuello, la vagina, el lquido Melvina, la vejiga, los riones y Teacher, adult education de los pulmones (neumona) pueden hacer que el trabajo de parto se inicie. Otras causas son:   Infecciones urogenitales, como infecciones por hongos y vaginosis bacteriana.   Anormalidades uterinas (forma del tero, sptum uterino, fibromas, hemorragias en la placenta).   Un cuello que ha sido operado y se abre prematuramente.   Malformaciones del beb.   Gestaciones mltiples (mellizos, trillizos y ms).   Ruptura del saco amnitico.  :Otros factores de riesgo del parto prematuro son   Historia previa de Sport and exercise psychologist.   Ruptura prematura de las [place].   La placenta cubre la apertura del cuello (placenta previa).   La placenta se separa del tero (abrupcin placentaria).   El cuello es demasiado dbil para contener al beb en el tero (cuello incompetente).   Hay mucho lquido en el saco amnitico (polihidramnios).   Consumo de drogas o hbito de fumar durante Firefighter.   No aumentar de peso lo suficiente durante el [employer].   Mujeres menores de 18 aos o [address] de [address] [place].   Nivel socioeconmico bajo.   Raza afroamericana.  SNTOMAS  Los signos y sntomas son:   Clicos del tipo menstrual   Contracciones con un intervalo entre 30 y 70 segundos, comienzan a ser regulares, se hacen ms frecuentes y se hacen ms intensas y dolorosas.   Contracciones que comienzan en la parte superior del tero y se expanden hacia abajo, hacia la zona inferior del abdomen y la espalda.   Sensacin de presin en la pelvis o dolor en la espalda.    Aparece una secrecin acuosa o sanguinolenta por la vagina.  DIAGNSTICO  El diagnstico puede confirmarse:   Con un examen vaginal.   Ecografa del cuello.   Muestra (hisopado) de las secreciones crvico-vaginales. Estas muestras se analizan para buscar la presencia de fibronectina fetal. Esta protena que se encuentra en las secreciones del tero y se asocia con el parto prematuro.   Monitoreo fetal  TRATAMIENTO  Segn el tiempo del Psychiatrist y otras [place], el mdico puede indicar reposo en cama. Si es necesario, le indicarn medicamentos para TEFL teacher las contracciones y apurar la maduracin de los pulmones del feto. Si el trabajo de parto se inicia antes de las 34 semanas de [place], se recomienda la hospitalizacin. El tratamiento depende de las condiciones en que se encuentre la madre y el beb.  PREVENCIN  Hay algunas cosas que [employer] puede hacer para disminuir el riesgo de trabajo de parto prematuro en futuros [employer]. Una mam puede:   Dejar de fumar.   Mantener un peso saludable y evitar sustancias qumicas y drogas innecesarias.   Controlar todo tipo de infeccin.   Informar al mdico si tiene una historia conocida de parto prematuro.  Document Released: 12/22/2007 Document Revised: 05/27/2011 [facility] Patient Information 2012 [place], [place].

## 2011-09-16 ENCOUNTER — Ambulatory Visit (HOSPITAL_COMMUNITY)
Admission: RE | Admit: 2011-09-16 | Discharge: 2011-09-16 | Disposition: A | Discharge status: Home / Self Care | Payer: Self-pay | Source: Ambulatory Visit | Attending: Advanced Practice Midwife | Admitting: Advanced Practice Midwife

## 2011-09-16 DIAGNOSIS — O209 Hemorrhage in early pregnancy, unspecified: Secondary | ICD-10-CM

## 2011-09-16 DIAGNOSIS — Z3689 Encounter for other specified antenatal screening: Secondary | ICD-10-CM | POA: Insufficient documentation

## 2011-09-23 ENCOUNTER — Ambulatory Visit (INDEPENDENT_AMBULATORY_CARE_PROVIDER_SITE_OTHER): Payer: Self-pay | Admitting: Family

## 2011-09-23 VITALS — BP 102/62 | Temp 97.6°F | Wt 148.0 lb

## 2011-09-23 DIAGNOSIS — R81 Glycosuria: Secondary | ICD-10-CM

## 2011-09-23 DIAGNOSIS — O209 Hemorrhage in early pregnancy, unspecified: Secondary | ICD-10-CM

## 2011-09-23 DIAGNOSIS — Z789 Other specified health status: Secondary | ICD-10-CM

## 2011-09-23 LAB — POCT URINALYSIS DIP (DEVICE)
Bilirubin Urine: NEGATIVE
Glucose, UA: 100 mg/dL — AB
Leukocytes, UA: NEGATIVE
Nitrite: NEGATIVE
Urobilinogen, UA: 0.2 mg/dL (ref 0.0–1.0)

## 2011-09-23 NOTE — Progress Notes (Signed)
Pulse: 78

## 2011-09-23 NOTE — Progress Notes (Signed)
Reviewed ultrasounds with pt (no abnormalities; poor visualization of heart)>rescan in 4-6 weeks.  No questions or concern; due to glucosuria obtained a CBG>129 (1hr PP)

## 2011-09-29 NOTE — L&D Delivery Note (Signed)
  Operative Delivery Note At 5:31 AM a viable and healthy female was delivered via Vaginal, Vacuum Investment banker, operational).  Presentation: vertex; Position: Right,, [place],, Anterior; Station: +3.  Verbal consent: obtained from patient.  Risks and benefits discussed in detail.  Risks include, but are not limited to the risks of anesthesia, bleeding, infection, damage to maternal tissues, fetal cephalhematoma.  There is also the risk of inability to effect vaginal delivery of the head, or shoulder dystocia that cannot be resolved by established maneuvers, leading to the need for emergency cesarean section.  APGAR: 8, 9; weight 7 lb 7 oz (3374 g).   Placenta status: Intact, Spontaneous.   Cord: 3 vessels with the following complications: None.    Anesthesia: Epidural Local  Instruments: [place] Episiotomy: Median Lacerations: 4th degree Suture Repair: 3.0 vicryl Est. Blood Loss (mL):   Mom to postpartum.  Baby to nursery-stable. Family and infant bonding well following delivery. Pt tolerated repair of 4th degree done by Dr Emelda Fear well.   Delivery by Dr Emelda Fear with assistance by Sharen Counter, CNM  [name], Lynn Burnett 01/28/2012, 9:53 PM

## 2011-09-29 NOTE — L&D Delivery Note (Signed)
I have seen @PTNAME @ and examination done.  I agree with documentation and plan as noted in resident/PA's note. Maisyn Nouri V 02/06/2012 2:44 AM

## 2011-10-08 ENCOUNTER — Inpatient Hospital Stay (HOSPITAL_COMMUNITY)
Admission: AD | Admit: 2011-10-08 | Discharge: 2011-10-08 | Disposition: A | Discharge status: Home / Self Care | Payer: Self-pay | Source: Ambulatory Visit | Attending: Obstetrics & Gynecology | Admitting: Obstetrics & Gynecology

## 2011-10-08 ENCOUNTER — Encounter (HOSPITAL_COMMUNITY): Payer: Self-pay

## 2011-10-08 DIAGNOSIS — R110 Nausea: Secondary | ICD-10-CM

## 2011-10-08 DIAGNOSIS — O99891 Other specified diseases and conditions complicating pregnancy: Secondary | ICD-10-CM | POA: Insufficient documentation

## 2011-10-08 DIAGNOSIS — R197 Diarrhea, unspecified: Secondary | ICD-10-CM | POA: Insufficient documentation

## 2011-10-08 LAB — URINALYSIS, ROUTINE W REFLEX MICROSCOPIC
Ketones, ur: NEGATIVE mg/dL
Leukocytes, UA: NEGATIVE
Protein, ur: NEGATIVE mg/dL
Urobilinogen, UA: 0.2 mg/dL (ref 0.0–1.0)

## 2011-10-08 MED ORDER — ONDANSETRON HCL 4 MG PO TABS: 4.0000 mg | ORAL_TABLET | Freq: Three times a day (TID) | ORAL | Status: AC | PRN

## 2011-10-08 MED ORDER — ONDANSETRON 8 MG PO TBDP
8.0000 mg | ORAL_TABLET | Freq: Once | ORAL | Status: AC
  Administered 2011-10-08: 8 mg via ORAL
  Filled 2011-10-08: qty 1

## 2011-10-08 NOTE — ED Notes (Signed)
No adverse effect from oral zofran, feels better

## 2011-10-08 NOTE — Progress Notes (Signed)
[facility] Department of Clinical Social Work Documentation of Interpretation   I assisted __Donna RN_________________ with interpretation of ____questions__________________ for this patient. 

## 2011-10-08 NOTE — Progress Notes (Signed)
Pt states diarrhea since Tuesday, denies vomiting, has felt some nausea. Having abd pain also, states it is "light", is scared r/t prior pregnancy, had miscarriage that began with diarrhea.

## 2011-10-08 NOTE — Progress Notes (Signed)
Patient states she has had diarrhea for 2 days and it is getting worse. Has some stomach pain that she states are not contractions. No bleeding, leaking and reports good fetal movement. Eda Royal in for translation for triage.

## 2011-10-08 NOTE — ED Provider Notes (Signed)
Lynn Burnett is a 31 y.o. year old [id number] female at [redacted]w[redacted]d weeks gestation who presents to [facility] reporting thin, occasionally watery diarrhea x 2 days ~ 6x/day. She denies fever, chills, sick contacts, contractions, vaginal bleeding or leaking of fluid. She ate tamales w/ cheese the night before Sx started.     History OB History    Grav Para Term Preterm Abortions TAB SAB Ect Mult Living   2    1  1         Past Medical History  Diagnosis Date  . No pertinent past medical history    Past Surgical History  Procedure Date  . No past surgeries    Family History: family history is negative for Anesthesia problems. Social History:  reports that she has never smoked. She has never used smokeless tobacco. She reports that she does not drink alcohol or use illicit drugs.  ROS: Otherwise neg    Blood pressure 109/65, pulse 87, temperature 98.1 F (36.7 C), temperature source Oral, resp. rate 16, height 5\' 3"  (1.6 m), weight 68.765 kg (151 lb 9.6 oz), SpO2 99.00%. Exam Physical Exam  Prenatal labs: ABO, Rh: --/--/B POS (03/08 0205) Antibody:   Rubella:   RPR:    HBsAg:    HIV:    GBS:    Cervix closed and long FHR category II, reassuring for gestation  Assessment/Plan: 1. Diarrheal illness w/out dehydration. Tolerating PO's   Plan: 1. D/C home 2. Rx Zofran .May take Imodium 3. BRAT diet 4. PTL precautions 5. F/U in [facility] on 10/21/11 [facility] or in [facility] if no improvement in 2 days.  Dorathy Kinsman 10/08/2011, 9:39 AM

## 2011-10-12 NOTE — ED Provider Notes (Signed)
Agree with above note.  LEGGETT,KELLY H. 10/12/2011 6:10 PM

## 2011-10-21 ENCOUNTER — Ambulatory Visit (INDEPENDENT_AMBULATORY_CARE_PROVIDER_SITE_OTHER): Payer: Self-pay | Admitting: Physician Assistant

## 2011-10-21 DIAGNOSIS — Z23 Encounter for immunization: Secondary | ICD-10-CM

## 2011-10-21 DIAGNOSIS — O209 Hemorrhage in early pregnancy, unspecified: Secondary | ICD-10-CM

## 2011-10-21 DIAGNOSIS — Z3490 Encounter for supervision of normal pregnancy, unspecified, unspecified trimester: Secondary | ICD-10-CM

## 2011-10-21 DIAGNOSIS — O0990 Supervision of high risk pregnancy, unspecified, unspecified trimester: Secondary | ICD-10-CM | POA: Insufficient documentation

## 2011-10-21 DIAGNOSIS — Z331 Pregnant state, incidental: Secondary | ICD-10-CM

## 2011-10-21 DIAGNOSIS — Z3480 Encounter for supervision of other normal pregnancy, unspecified trimester: Secondary | ICD-10-CM

## 2011-10-21 LAB — POCT URINALYSIS DIP (DEVICE)
Bilirubin Urine: NEGATIVE
Glucose, UA: NEGATIVE mg/dL
Hgb urine dipstick: NEGATIVE
Leukocytes, UA: NEGATIVE
Nitrite: NEGATIVE
Urobilinogen, UA: 0.2 mg/dL (ref 0.0–1.0)

## 2011-10-21 MED ORDER — TETANUS-DIPHTH-ACELL PERTUSSIS 5-2.5-18.5 LF-MCG/0.5 IM SUSP
0.5000 mL | Freq: Once | INTRAMUSCULAR | Status: AC
  Administered 2011-10-21: 0.5 mL via INTRAMUSCULAR

## 2011-10-21 NOTE — Patient Instructions (Signed)
Vacuna difteria/ttanos (Td) o [place] difteria, ttanos, tos convulsa (Tdap), Lo que debe saber (Tetanus, Diphtheria [Td] or Tetanus, Diphtheria, Pertussis [Tdap] Vaccine, What You Need to Know) PORQU VACUNARSE? El ttanos , la difteria y la tos ferina pueden ser enfermedades graves.  El TTANOS  (trismo) provoca la contraccin dolorosa y rigidez de los msculos, por lo general, en todo el cuerpo.   Puede causar la contraccin de los msculos de la cabeza y el cuello de modo que el enfermo no puede abrir la boca ni tragar., y en algunos casos, tampoco puede respirar.. El ttanos causa la muerte de 1 de cada 5 personas que se infectan.  LA DIFTERIA produce la formacin de una membrana gruesa que cubre el fondo de la garganta.  Puede causar problemas respiratorios, parlisis, insuficiencia cardaca, e incluso la muerte.  El PERTUSIS (tos [place]) causa ataques de tos intensa que pueden dificultar la respiracin, provocar vmitos e interrumpir el sueo.   Puede causar prdida de peso, incontinencia, fractura de [place], y desmayos por la intensa tos. Hasta de 2 de cada 100 adolescentes y 5 de cada 100 adultos que enferman de tos [place] deben ser hospitalizados o tienen complicaciones como la neumona y la [place].  Estas 3 enfermedades son provocadas por bacterias. La difteria y la tos Benetta Spar se [place] de persona a Social worker. El ttanos ingresa al organismo a travs de cortes, rasguos o heridas. En los [place] ocurran alrededor de 200 000 casos por ao de difteria y tos [place], antes de que existieran las [place], y tambin ocurran cientos de casos de ttanos. Desde la aparicin de las vacunas, el ttanos y la difteria han disminuido en alrededor del 99% y los casos de tos ferina disminuyeron aproximadamente el 92%.  Los nios menores de 6 aos deben recibir la vacuna DTaP para estar protegidos contra estas tres enfermedades. Pero los [employer], los adolescentes y los adultos tambin  necesitan proteccin. VACUNAS PARA ADOLESCENTES Y ADULTOS Vacunas Tdap y Td  Hay dos vacunas disponibles para proteger de estas enfermedades a nios a Glass blower/designer de los 7aos:   La vacuna Td fue utilizada durante muchos aos. [employer]ge contra el ttanos y la difteria.   La vacuna Tdap fue autorizada en 2005. Es la primera vacuna para adolescentes y adultos que protege contra la tos ferina y el ttanos y la difteria.  Una dosis de refuerzo de la Td se recomienda cada 10 aos. La Tdap se aplica slo una vez.  QU VACUNA DEBO APLICARME Y CUANDO? Las edades de 7 a 18 aos  [employer] 11 y los 12 aos se recomienda una dosis de Tdap. Esta dosis puede aplicarse desde los 7 aos en los nios que no han recibido una o ms dosis de DTaP anteriormente.   Los nios y adolescentes que no recibieron todas las dosis programadas de DTaP o DTP a los 7 aos deben completar la serie usando una combinacin de Td y Tdap.  Adultos de 19 aos o ms  [employer] adultos deben recibir una dosis de refuerzo de Td cada 10 aos. Los adultos de menos de 65 aos que nunca hayan recibido la Tdap deben reemplazarla por la siguiente dosis de refuerzo. Los adultos a partir de los 65 aos puedenrecibir una dosis de Tdap.   Los adultos (incluyendo las mujeres que podran quedar embarazadas y los adultos mayores de 65 aos) que tienen contacto cercano con un beb menor de 12 meses deben aplicarse una dosis de Tdap para proteger al beb  de la tos [place].   Los trabajadores de la salud que tengan contacto directo con pacientes en hospitales o clnicas deben recibir una dosis de Tdap.  Proteccin despus de [place] herida  Es posible que una persona que tenga un corte o quemadura grave necesite una dosis de Td o Tdap para prevenir la infeccin por ttanos. Puede usarse la Tdap en personas que nunca recibieron una dosis. Pero debe usarse la Td, si la Tdap no se encuentra disponible, o para:   Cualquier persona que haya recibido una dosis de  Tdap.   Los nios [employer] 7 y los 9 aos que han [employer] series de DTap anteriormente.   Adultos de 65 aos o ms.  Mujeres embarazadas.   Las mujeres embarazadas que nunca recibieron una dosis de Ddap deben recibirla despus de la 20a semana de gestacin y preferiblemente durante Contractor. trimestre. Si no se aplican la Tdap durante el embarazo, deben recibirla lo antes posible despus del parto. Las mujeres embarazadas que han recibido la Tdap y tienen que aplicarse la vacuna contra el ttanos o la difteria durante el [place], deben recibir la Td.  Las vacunas Tdap y Td pueden ser administradas al mismo tiempo que otras vacunas. ALGUNAS PERSONAS NO DEBEN RECIBIR LA VACUNA O DEBEN [employer]  Las personas que hayan tenido una reaccin alrgica que haya puesto en peligro su vida despus de una dosis de vacuna contra el ttanos, la difteria o la tos ferina no deben recibir Td ni Tdap..   Las personas que tengan alergias graves a algn componente de una vacuna no deben recibir esa vacuna. Informe a su mdico si la persona que recibe la vacuna sufre alergias graves.   Cualquier persona que [employer] en coma o que haya tenido convulsiones dentro de los 7 [address]  [address] posteriores despus de una dosis de DTP o DTaP no debe recibir la Tdap, salvo que se encuentre una causa que no fuera la vacuna. Estas personas pueden recibir Td.   Consulte a su mdico si la persona que recibe [place] de las vacunas:   Tiene epilepsia o algn otro problema del sistema nervioso.   Tuvo inflamacin o dolor intenso despus de una dosis de DTP, DTaP, DT, Td, o Tdap.   Ha tenido el sndrome de Scientific laboratory technician (GBS por sus siglas en ingls).  Las personas que sufran una enfermedad moderada o grave el da en que se programa la vacuna, deben esperar a recuperarse para recibir las vacunas Tdap o Td. Por lo general, una persona con una enfermedad leve o fiebre baja puede recibir la vacuna. CULES SON LOS RIESGOS DE LAS VACUNAS  TDAP Y TD? Con Cathleen Corti, al igual que con cualquier [employer], siempre existe un pequeo riesgo de una reaccin alrgica que ponga en peligro la vida o cause otro problema grave. Todo procedimiento mdico, inclusive la vacunacin pueden causar breves episodios de lipotimia o sntomas relacionados (como movimientos espasmdicos). Para evitar los [employer] y las lesiones causadas por las cadas, permanezca sentado o recustese durante los 15 minutos posteriores a la vacunacin. Informe a su mdico si el paciente se siente dbil o mareado, tiene cambios en la visin o siente zumbidos en los odos.  Es mucho ms probable que tener ttanos, difteria, o tos ferina cause problemas ms graves que los provocados por recibir cualquiera de las vacunas Td o Tdap. A continuacin se enumeran los problemas informados despus de las vacunas Td y Tdap. Problemas Leves (perceptibles, pero que no interfirieron con las  actividades): Tdap  Dolor (alrededor de 3 de cada 4 adolescentes y 2 de cada 3 adultos).   Enrojecimiento o inflamacin en el sitio de la inyeccin (alrededor de 1 de cada 5).   Fiebre leve de al menos 100.4 F (38 C) (hasta alrededor de 1 cada 25 adolescentes y 1 de cada 100 adultos).   Dolor de cabeza (alrededor de 4 de cada 10 adolescentes y 3 de cada 10 adultos).   Cansancio (alrededor de 1 de cada 3 adolescentes y 1 de cada 4 adultos).   Nuseas, vmitos, diarrea, o dolor de estmago (hasta 1 de cada 4 adolescentes y 1 de cada 10 adultos).   Escalofros, dolores corporales, dolor articular, erupciones, o inflamacin de las glndulas (poco frecuente).  Td  Dolor (hasta alrededor de 8 de cada 10).   Enrojecimiento o inflamacin de la inyeccin (alrededor de 1 de cada 3).   Fiebre leve (hasta alrededor de 1 de cada 5).   Dolor de cabeza o cansancio (poco frecuente).  Problemas Moderados (interfieren con las [place], [place] no requieren atencin mdica): Tdap  Dolor en el sitio de  la inyeccin (alrededor de 1 de cada 20 adolescentes y 1 de cada 100 adultos).   Enrojecimiento o inflamacin de la inyeccin (alrededor de 1 de cada 16 adolescentes y 1 de cada 25 adultos).   Fiebre de ms de 102 F (38.9 C) (alrededor de 1 de cada 100 adolescentes y 1 de cada 250 adultos).   Dolor de cabeza (1 de cada 300).   Nuseas, vmitos, diarrea, o dolor de estmago (hasta 3 de cada 100 adolescentes y 1 de cada 100 adultos).  Td  Fiebre de ms de 102 F (38.9 C) (poco comn).  Tdap o Td  Inflamacin de gran extensin en el brazo en el que se aplic la vacuna (hasta 3 de cada 100).  Problemas [place] (no puede realizar [employer]; requiere Psychologist, prison and probation services) Tdap o Td  Inflamacin, dolor intenso, sangrado y enrojecimiento en el brazo, en el sitio de la inyeccin (poco frecuente).  Puede producirse una reaccin alrgica grave despus de cualquier vacuna. Se estima que estas reacciones ocurren en menos de una de cada un milln de dosis. QU PASA SI HAY UNA REACCIN GRAVE? Qu signos debo buscar? Cualquier estado poco habitual, como una reaccin alrgica grave o fiebre alta. Si le produce Runner, broadcasting/film/video grave, se manifestar dentro de algunos minutos a una hora despus de recibir la vacuna. Entre los signos de Automotive engineer grave se encuentran la dificultad para respirar, debilidad, ronquera o sibilancias, latidos cardacos acelerados, urticaria, mareos, palidez, o inflamacin de la garganta. Qu debo hacer?  Comunquese con su mdico o lleve inmediatamente a la persona al mdico.   Dgale a su mdico qu ocurri, la fecha y hora en que sucedi y cundo le aplicaron la vacuna.   Pida a su mdico que informe sobre la reaccin llenando un formulario del Sistema de Informacin de Reacciones Adversos a las Administrator, arts (VAERS, por sus siglas en ingls). O, puede presentar este informe a travs del sitio web de VAERS [email or web address] o puede llamar al  [phone].  VAERS no brinda asistencia mdica. PROGRAMA NACIONAL DE COMPENSACIN DE DAOS POR VACUNAS El [place] de Compensacin de Daos por Vacunas (VICP) fue creado en 1986.  Aquellas personas que consideren que han sufrido un dao como consecuencia de una vacuna y quieren saber ms acerca del programa y como presentar [place], [place] llamar al [phone] o visitar su sitio web  en [email or web address]  CMO PUEDO OBTENER MS INFORMACIN?  El profesional podr darle el prospecto de la vacuna o sugerirle otras fuentes de informacin.   Comunquese con el servicio de salud de su localidad o [address].   Comunquese con los Centros para el control y la prevencin de Child psychotherapist for Disease Control and Prevention , CDC).   Llame al [phone] (1-800-CDC-INFO).   Visite los sitios web de [employer] en PicCapture.uy  CDC Td and Tdap Interim VIS-Spanish (1/12) Document Released: 12/31/2008 Document Revised: 06/02/2011 [facility] Patient Information 2012 [place], [place].

## 2011-10-21 NOTE — Progress Notes (Signed)
No complaints. Tdap today. Glucola at next visit

## 2011-11-11 ENCOUNTER — Ambulatory Visit (INDEPENDENT_AMBULATORY_CARE_PROVIDER_SITE_OTHER): Payer: Self-pay | Admitting: Physician Assistant

## 2011-11-11 DIAGNOSIS — Z3490 Encounter for supervision of normal pregnancy, unspecified, unspecified trimester: Secondary | ICD-10-CM

## 2011-11-11 DIAGNOSIS — O209 Hemorrhage in early pregnancy, unspecified: Secondary | ICD-10-CM

## 2011-11-11 LAB — POCT URINALYSIS DIP (DEVICE)
Glucose, UA: 250 mg/dL — AB
Hgb urine dipstick: NEGATIVE
Nitrite: NEGATIVE
Urobilinogen, UA: 0.2 mg/dL (ref 0.0–1.0)

## 2011-11-11 LAB — CBC
HCT: 35.4 % — ABNORMAL LOW (ref 36.0–46.0)
MCH: 35.1 pg — ABNORMAL HIGH (ref 26.0–34.0)
MCHC: 35 g/dL (ref 30.0–36.0)
RDW: 13.9 % (ref 11.5–15.5)

## 2011-11-11 LAB — RPR

## 2011-11-11 NOTE — Progress Notes (Signed)
Pulse: 83

## 2011-11-11 NOTE — Progress Notes (Signed)
No complains today. Natural method for contraception and condom. Interested in breast feeding. Preterm labor signs and symptoms discussed. 1h GTT t and 3 Trimester labs drawn today.

## 2011-11-11 NOTE — Patient Instructions (Signed)
Sport and exercise psychologist  (Preterm Labor) El parto prematuro comienza antes de la semana 37 de [place]. La duracin de un embarazo normal es de 39 a 41 semanas.  SNTOMAS  Los signos y sntomas son:   Clicos del tipo menstrual   Contracciones con un intervalo entre 30 y 70 segundos, comienzan a ser regulares, se hacen ms frecuentes y se hacen ms intensas y dolorosas.   Contracciones que comienzan en la parte superior del tero y se expanden hacia abajo, hacia la zona inferior del abdomen y la espalda.   Sensacin de presin en la pelvis o dolor en la espalda.   Aparece una secrecin acuosa o sanguinolenta por la vagina.  DIAGNSTICO  El diagnstico puede confirmarse:   Con un examen vaginal.   Ecografa del cuello.   Muestra (hisopado) de las secreciones crvico-vaginales. Estas muestras se analizan para buscar la presencia de fibronectina fetal. Esta protena que se encuentra en las secreciones del tero y se asocia con el parto prematuro.   Monitoreo fetal  TRATAMIENTO  Segn el tiempo del Psychiatrist y otras [place], el mdico puede indicar reposo en cama. Si es necesario, le indicarn medicamentos para TEFL teacher las contracciones y apurar la maduracin de los pulmones del feto. Si el trabajo de parto se inicia antes de las 34 semanas de [place], se recomienda la hospitalizacin. El tratamiento depende de las condiciones en que se encuentre la madre y el beb.  PREVENCIN  Hay algunas cosas que [employer] puede hacer para disminuir el riesgo de trabajo de parto prematuro en futuros [employer]. Neomia Dear mam puede:   Mantener un peso saludable y evitar sustancias qumicas y drogas innecesarias.   Controlar todo tipo de infeccin.   Informar al mdico si tiene una historia conocida de parto prematuro.  Document Released: 12/22/2007 Document Revised: 05/27/2011 [facility] Patient Information 2012 [place], [place].

## 2011-11-12 LAB — GLUCOSE TOLERANCE, 1 HOUR: Glucose, 1 Hour GTT: 136 mg/dL (ref 70–140)

## 2011-12-02 ENCOUNTER — Encounter: Payer: Self-pay | Admitting: Family Medicine

## 2011-12-02 ENCOUNTER — Ambulatory Visit (INDEPENDENT_AMBULATORY_CARE_PROVIDER_SITE_OTHER): Payer: Self-pay | Admitting: Family Medicine

## 2011-12-02 VITALS — BP 102/67 | Temp 97.0°F | Wt 159.4 lb

## 2011-12-02 DIAGNOSIS — O99810 Abnormal glucose complicating pregnancy: Secondary | ICD-10-CM

## 2011-12-02 DIAGNOSIS — Z3490 Encounter for supervision of normal pregnancy, unspecified, unspecified trimester: Secondary | ICD-10-CM

## 2011-12-02 LAB — POCT URINALYSIS DIP (DEVICE)
Glucose, UA: NEGATIVE mg/dL
Hgb urine dipstick: NEGATIVE
Nitrite: NEGATIVE
Protein, ur: NEGATIVE mg/dL
Urobilinogen, UA: 0.2 mg/dL (ref 0.0–1.0)

## 2011-12-02 MED ORDER — CLOTRIMAZOLE-BETAMETHASONE 1-0.05 % EX CREA: TOPICAL_CREAM | Freq: Two times a day (BID) | CUTANEOUS | Status: AC

## 2011-12-02 MED ORDER — HYDROCORTISONE ACETATE 25 MG RE SUPP: 25.0000 mg | Freq: Two times a day (BID) | RECTAL | Status: AC | PRN

## 2011-12-02 NOTE — Progress Notes (Signed)
Pulse- 88.  Pt c/o itching in vaginal area "clitoris" and "hemorroids"

## 2011-12-02 NOTE — Progress Notes (Signed)
External vaginal itching.  Mild erythema.  Will prescribe clotrimazole.  Having hemorrhoids.  Will give anusol.  Scheduled 3hr GTT for 1hr 136.   Good fetal activity.  No other concerns.  F/u in 2 weeks or sooner if 3hr positive.

## 2011-12-02 NOTE — Patient Instructions (Signed)
Pregnancy - Third Trimester The third trimester of pregnancy (the last 3 months) is a period of the most rapid growth for you and your baby. The baby approaches a length of 20 inches and a weight of 6 to 10 pounds. The baby is adding on fat and getting ready for life outside your body. While inside, babies have periods of sleeping and waking, suck their thumbs, and hiccups. You can often feel small contractions of the uterus. This is false labor. It is also called Braxton-Hicks contractions. This is like a practice for labor. The usual problems in this stage of pregnancy include more difficulty breathing, swelling of the hands and feet from water retention, and having to urinate more often because of the uterus and baby pressing on your bladder.  PRENATAL EXAMS  Blood work may continue to be done during prenatal exams. These tests are done to check on your health and the probable health of your baby. Blood work is used to follow your blood levels (hemoglobin). Anemia (low hemoglobin) is common during pregnancy. Iron and vitamins are given to help prevent this. You may also continue to be checked for diabetes. Some of the past blood tests may be done again.   The size of the uterus is measured during each visit. This makes sure your baby is growing properly according to your pregnancy dates.   Your blood pressure is checked every prenatal visit. This is to make sure you are not getting toxemia.   Your urine is checked every prenatal visit for infection, diabetes and protein.   Your weight is checked at each visit. This is done to make sure gains are happening at the suggested rate and that you and your baby are growing normally.   Sometimes, an ultrasound is performed to confirm the position and the proper growth and development of the baby. This is a test done that bounces harmless sound waves off the baby so your caregiver can more accurately determine due dates.   Discuss the type of pain  medication and anesthesia you will have during your labor and delivery.   Discuss the possibility and anesthesia if a Cesarean Section might be necessary.   Inform your caregiver if there is any mental or physical violence at home.  Sometimes, a specialized non-stress test, contraction stress test and biophysical profile are done to make sure the baby is not having a problem. Checking the amniotic fluid surrounding the baby is called an amniocentesis. The amniotic fluid is removed by sticking a needle into the belly (abdomen). This is sometimes done near the end of pregnancy if an early delivery is required. In this case, it is done to help make sure the baby's lungs are mature enough for the baby to live outside of the womb. If the lungs are not mature and it is unsafe to deliver the baby, an injection of cortisone medication is given to the mother 1 to 2 days before the delivery. This helps the baby's lungs mature and makes it safer to deliver the baby. CHANGES OCCURING IN THE THIRD TRIMESTER OF PREGNANCY Your body goes through many changes during pregnancy. They vary from person to person. Talk to your caregiver about changes you notice and are concerned about.  During the last trimester, you have probably had an increase in your appetite. It is normal to have cravings for certain foods. This varies from person to person and pregnancy to pregnancy.   You may begin to get stretch marks on your hips,   abdomen, and breasts. These are normal changes in the body during pregnancy. There are no exercises or medications to take which prevent this change.   Constipation may be treated with a stool softener or adding bulk to your diet. Drinking lots of fluids, fiber in vegetables, fruits, and whole grains are helpful.   Exercising is also helpful. If you have been very active up until your pregnancy, most of these activities can be continued during your pregnancy. If you have been less active, it is helpful  to start an exercise program such as walking. Consult your caregiver before starting exercise programs.   Avoid all smoking, alcohol, un-prescribed drugs, herbs and "street drugs" during your pregnancy. These chemicals affect the formation and growth of the baby. Avoid chemicals throughout the pregnancy to ensure the delivery of a healthy infant.   Backache, varicose veins and hemorrhoids may develop or get worse.   You will tire more easily in the third trimester, which is normal.   The baby's movements may be stronger and more often.   You may become short of breath easily.   Your belly button may stick out.   A yellow discharge may leak from your breasts called colostrum.   You may have a bloody mucus discharge. This usually occurs a few days to a week before labor begins.  HOME CARE INSTRUCTIONS   Keep your caregiver's appointments. Follow your caregiver's instructions regarding medication use, exercise, and diet.   During pregnancy, you are providing food for you and your baby. Continue to eat regular, well-balanced meals. Choose foods such as meat, fish, milk and other low fat dairy products, vegetables, fruits, and whole-grain breads and cereals. Your caregiver will tell you of the ideal weight gain.   A physical sexual relationship may be continued throughout pregnancy if there are no other problems such as early (premature) leaking of amniotic fluid from the membranes, vaginal bleeding, or belly (abdominal) pain.   Exercise regularly if there are no restrictions. Check with your caregiver if you are unsure of the safety of your exercises. Greater weight gain will occur in the last 2 trimesters of pregnancy. Exercising helps:   Control your weight.   Get you in shape for labor and delivery.   You lose weight after you deliver.   Rest a lot with legs elevated, or as needed for leg cramps or low back pain.   Wear a good support or jogging bra for breast tenderness during  pregnancy. This may help if worn during sleep. Pads or tissues may be used in the bra if you are leaking colostrum.   Do not use hot tubs, steam rooms, or saunas.   Wear your seat belt when driving. This protects you and your baby if you are in an accident.   Avoid raw meat, cat litter boxes and soil used by cats. These carry germs that can cause birth defects in the baby.   It is easier to loose urine during pregnancy. Tightening up and strengthening the pelvic muscles will help with this problem. You can practice stopping your urination while you are going to the bathroom. These are the same muscles you need to strengthen. It is also the muscles you would use if you were trying to stop from passing gas. You can practice tightening these muscles up 10 times a set and repeating this about 3 times per day. Once you know what muscles to tighten up, do not perform these exercises during urination. It is more likely   to cause an infection by backing up the urine.   Ask for help if you have financial, counseling or nutritional needs during pregnancy. Your caregiver will be able to offer counseling for these needs as well as refer you for other special needs.   Make a list of emergency phone numbers and have them available.   Plan on getting help from family or friends when you go home from the hospital.   Make a trial run to the hospital.   Take prenatal classes with the father to understand, practice and ask questions about the labor and delivery.   Prepare the baby's room/nursery.   Do not travel out of the city unless it is absolutely necessary and with the advice of your caregiver.   Wear only low or no heal shoes to have better balance and prevent falling.  MEDICATIONS AND DRUG USE IN PREGNANCY  Take prenatal vitamins as directed. The vitamin should contain 1 milligram of folic acid. Keep all vitamins out of reach of children. Only a couple vitamins or tablets containing iron may be fatal  to a baby or young child when ingested.   Avoid use of all medications, including herbs, over-the-counter medications, not prescribed or suggested by your caregiver. Only take over-the-counter or prescription medicines for pain, discomfort, or fever as directed by your caregiver. Do not use aspirin, ibuprofen (Motrin, Advil, Nuprin) or naproxen (Aleve) unless OK'd by your caregiver.   Let your caregiver also know about herbs you may be using.   Alcohol is related to a number of birth defects. This includes fetal alcohol syndrome. All alcohol, in any form, should be avoided completely. Smoking will cause low birth rate and premature babies.   Street/illegal drugs are very harmful to the baby. They are absolutely forbidden. A baby born to an addicted mother will be addicted at birth. The baby will go through the same withdrawal an adult does.  SEEK MEDICAL CARE IF: You have any concerns or worries during your pregnancy. It is better to call with your questions if you feel they cannot wait, rather than worry about them. DECISIONS ABOUT CIRCUMCISION You may or may not know the sex of your baby. If you know your baby is a boy, it may be time to think about circumcision. Circumcision is the removal of the foreskin of the penis. This is the skin that covers the sensitive end of the penis. There is no proven medical need for this. Often this decision is made on what is popular at the time or based upon religious beliefs and social issues. You can discuss these issues with your caregiver or pediatrician. SEEK IMMEDIATE MEDICAL CARE IF:   An unexplained oral temperature above 102 F (38.9 C) develops, or as your caregiver suggests.   You have leaking of fluid from the vagina (birth canal). If leaking membranes are suspected, take your temperature and tell your caregiver of this when you call.   There is vaginal spotting, bleeding or passing clots. Tell your caregiver of the amount and how many pads are  used.   You develop a bad smelling vaginal discharge with a change in the color from clear to white.   You develop vomiting that lasts more than 24 hours.   You develop chills or fever.   You develop shortness of breath.   You develop burning on urination.   You loose more than 2 pounds of weight or gain more than 2 pounds of weight or as suggested by your   caregiver.   You notice sudden swelling of your face, hands, and feet or legs.   You develop belly (abdominal) pain. Round ligament discomfort is a common non-cancerous (benign) cause of abdominal pain in pregnancy. Your caregiver still must evaluate you.   You develop a severe headache that does not go away.   You develop visual problems, blurred or double vision.   If you have not felt your baby move for more than 1 hour. If you think the baby is not moving as much as usual, eat something with sugar in it and lie down on your left side for an hour. The baby should move at least 4 to 5 times per hour. Call right away if your baby moves less than that.   You fall, are in a car accident or any kind of trauma.   There is mental or physical violence at home.  Document Released: 09/08/2001 Document Revised: 09/03/2011 Document Reviewed: 03/13/2009 [employer] Patient Information 2012 [employer]. 

## 2011-12-08 ENCOUNTER — Other Ambulatory Visit: Payer: Self-pay

## 2011-12-08 ENCOUNTER — Encounter: Payer: Self-pay | Admitting: *Deleted

## 2011-12-08 DIAGNOSIS — R7309 Other abnormal glucose: Secondary | ICD-10-CM

## 2011-12-09 ENCOUNTER — Encounter: Payer: Self-pay | Admitting: Obstetrics & Gynecology

## 2011-12-09 LAB — GLUCOSE TOLERANCE, 3 HOURS: Glucose Tolerance, 1 hour: 164 mg/dL (ref 70–189)

## 2011-12-10 ENCOUNTER — Telehealth: Payer: Self-pay | Admitting: *Deleted

## 2011-12-10 NOTE — Telephone Encounter (Signed)
Language line#[id number]. Advised patient of abnormal 3 hr GTT with new dx of GDM and new appt on Monday 12/14/2011 @0830 . Patient agrees and states understanding.

## 2011-12-10 NOTE — Telephone Encounter (Signed)
Message copied by Jill Side on Thu Dec 10, 2011  8:20 AM ------      Message from: Lynn Burnett A      Created: Wed Dec 09, 2011  1:31 PM       Abnormal 3 hr GTT, has diagnosis of GDM. Please transfer to [facility] on Monday, needs DM education

## 2011-12-10 NOTE — Telephone Encounter (Signed)
Called pt w/Lynn Burnett- interpreter and left message that we are calling with test result and appt information. We will call back. *Note: pt's appt has been changed to Monday 12/11/11 @ 0830.

## 2011-12-14 ENCOUNTER — Ambulatory Visit (INDEPENDENT_AMBULATORY_CARE_PROVIDER_SITE_OTHER): Payer: Self-pay | Admitting: Family Medicine

## 2011-12-14 ENCOUNTER — Encounter: Payer: Self-pay | Attending: Obstetrics and Gynecology | Admitting: Dietician

## 2011-12-14 VITALS — Temp 97.0°F | Wt 161.2 lb

## 2011-12-14 DIAGNOSIS — O99810 Abnormal glucose complicating pregnancy: Secondary | ICD-10-CM

## 2011-12-14 DIAGNOSIS — O0990 Supervision of high risk pregnancy, unspecified, unspecified trimester: Secondary | ICD-10-CM

## 2011-12-14 DIAGNOSIS — Z789 Other specified health status: Secondary | ICD-10-CM

## 2011-12-14 DIAGNOSIS — Z713 Dietary counseling and surveillance: Secondary | ICD-10-CM | POA: Insufficient documentation

## 2011-12-14 DIAGNOSIS — O24419 Gestational diabetes mellitus in pregnancy, unspecified control: Secondary | ICD-10-CM

## 2011-12-14 LAB — POCT URINALYSIS DIP (DEVICE)
Bilirubin Urine: NEGATIVE
Hgb urine dipstick: NEGATIVE
Ketones, ur: NEGATIVE mg/dL
Nitrite: NEGATIVE
Specific Gravity, Urine: 1.025 (ref 1.005–1.030)
pH: 7 (ref 5.0–8.0)

## 2011-12-14 NOTE — Progress Notes (Signed)
Diabetes Education:  G2P1 lady with EDC of 02/01/2012, and a history of Pre-diabetes seen today for meter and monitoring instructions.  Accompanied by her husband.  Provided a True Track Meter Kit Lot:[id number] Expiration: 2013/02/25.  Provided 1 canister of strips Lot: [id number] EXP: 2014/01/24 and 1 box of lancets LOT: [id number] Expiration: 2016/01/07.  Instructed to monitor fasting and blood glucose levels 2 hrs after the first bite of each meal.  Provided handout Nutrition, Diabetes and Pregnancy, Carb Counting Guide, and reference for glucose monitoring times. Today, post meal glucose was 99 mg after a glass of milk about 2.5 hours ago.   Instructed to bring meter and glucose log to all medical appointments.  Maggie Luva Metzger, RN, RD, CDE.

## 2011-12-14 NOTE — Progress Notes (Signed)
Pt needs AVS in spanish in the future, she was unable to read the last one.  First High Risk Visit, Needs to see Social Work, Nutrition and Diabetes Ed.

## 2011-12-14 NOTE — Progress Notes (Signed)
Nutrition Note:  (1st visit consult) Normal weight patient with hx of prediabetes, dx with GDM.  Current wt gain of 34.2# @ [redacted]w[redacted]d gestation is 6-7#> expected. Pt reports good intake of 5 meals daily, no food allergies and nausea occasionally. Pt eats a well balanced meal with excessive carbohydrates.  Pt given extensive GDM diet education with CHO/protein combo, 3 meals and 3 snacks, and serving sizes.  Pt agrees to increase activity, example walking 15 minutes daily.  Pt does receive WIC services and plans to breastfeed.  Follow up if referred.  Cy Blamer, RD

## 2011-12-14 NOTE — Patient Instructions (Signed)
Diabetes mellitus gestacional (Gestational Diabetes Mellitus) La diabetes mellitus gestacional se produce slo durante el embarazo. Aparece cuando el organismo no puede controlar adecuadamente la glucosa (azcar) que aumenta en la sangre despus de comer. Durante el embarazo, se produce una resistencia a la insulina (sensibilidad reducida a la insulina) debido a la liberacin de hormonas por parte de la placenta. Generalmente, el pncreas de una mujer embarazada produce la cantidad suficiente de insulina para vencer esa resistencia. Sin embargo, en la diabetes gestacional, hay insulina pero no cumple su funcin adecuadamente. Si la resistencia es lo suficientemente grave como para que el pncreas no produzca la cantidad de insulina suficiente, la glucosa extra se acumula en la sangre.  QUINES TIENEN RIESGO DE DESARROLLAR DIABETES GESTACIONAL?  Las mujeres con historia de diabetes en la familia.   Las mujeres de ms de 25 aos.   Las que presentan sobrepeso.   Las mujeres que pertenecen a ciertos grupos tnicos (latinas, afroamericanas, norteamericanas nativas, asiticas y las originarias de las islas del [employer].  QUE PUEDE OCURRIRLE AL BEB? Si el nivel de glucosa en sangre de la madre es demasiado elevado mientras este embarazada, el nivel extra de azcar pasar por el cordn umbilical hacia el beb. Algunos de los problemas del beb pueden ser:  Beb demasiado grande: si el nio recibe demasiada azcar, puede aumentar mucho de peso. Esto puede hacer que sea demasiado grande para nacer por parto normal (vaginal) por lo que ser necesario realizar una cesrea.   Bajo nivel de glucosa (hipoglucemia): el beb produce insulina extra en respuesta a la excesiva cantidad de azcar que obtiene de la madre. Cuando el beb nace y ya no necesita insulina extra, su nivel de azcar en sangre puede disminuir.   Ictericia (coloracin amarillenta de la piel y los ojos): esto es bastante frecuente en los  bebs. La causa es la acumulacin de una sustancia qumica denominada bilirrubina. No siempre es un trastorno grave, pero se observa con frecuencia en los bebs cuyas madres sufren diabetes gestacional.  RIESGOS PARA LA MADRE Las mujeres que han sufrido diabetes gestacional pueden tener ms riesgos para algunos problemas como:  Preeclampsia o toxemia, incluyendo problemas con hipertensin arterial. La presin arterial y los niveles de protenas en la orina deben controlarse con frecuencia.   Infecciones   Parto por cesrea.   Aparicin de diabetes tipo 2 en una etapa posterior de la vida. Alrededor del 30% al 50% sufrir diabetes posteriormente, especialmente las que son obesas.  DIAGNSTICO Las hormonas que causan resistencia a la insulina tienen su mayor nivel alrededor de las 24 a 28 semanas del embarazo. Si se experimentan sntomas, stos son similares a los sntomas que normalmente aparecen durante el embarazo.  La diabetes mellitus gestacional generalmente se diagnostica por medio de un mtodo en dos partes: 1. Despus de la 24 a 28 semanas de embarazo, la mujer debe beber una solucin que contiene glucosa y realizar un anlisis de sangre. Si el nivel de glucosa es elevado, la realizarn un segundo anlisis.  2. La prueba oral de tolerancia a la glucosa, que dura aproximadamente tres horas. Despus de realizar ayuno durante la noche, se controla nivel de glucosa en sangre. La mujer bebe una solucin que contiene glucosa y le realizan anlisis de glucosa en sangre cada hora.  Si la mujer tiene factores de riesgos para la diabetes mellitus gestacional, el mdico podr indicar el anlisis antes de las 24 semanas de embarazo. TRATAMIENTO El tratamiento est dirigido a mantener la glucosa en   sangre de la madre en un nivel normal y puede incluir:  La planificacin de los alimentos.   Recibir insulina u otro medicamento para controlar el nivel de glucosa en sangre.   La prctica de ejercicios.    Llevar un registro diario de los alimentos que consume.   Control y registro de los niveles de glucosa en sangre.   Control de los niveles de cetona en la orina, aunque esto ya no se considera necesario en la mayora de los embarazos.  INSTRUCCIONES PARA EL CUIDADO DOMICILIARIO Mientras est embarazada:  Siga los consejos de su mdico relacionados con los controles prenatales, la planificacin de la comida, la actividad fsica, los medicamentos, vitaminas, los anlisis de sangre y otras pruebas y las actividades fsicas.   Lleve un registro de las comidas, las pruebas de glucosa en sangre y la cantidad de insulina que recibe (si corresponde). Muestre todo al profesional en cada consulta mdica prenatal.   Si sufre diabetes mellitus gestacional, podr tener problemas de hipoglucemia (nivel bajo de glucosa en sangre). Podr sospechar este problema si se siente repentinamente mareada, tiene temblores y/o se siente dbil. Si cree que esto le est ocurriendo, y tiene un medidor de glucosa, mida su nivel de glucosa en sangre. Siga los consejos de su mdico sobre el modo y el momento de tratar su nivel de glucosa en sangre. Generalmente se sigue la regla 15:15 Consuma 15 g de hidratos de carbono, espere 15 minutos y vuelva controlar el nivel de glucosa en sangre.. Ejemplos de 15 g de hidratos de carbono son:   1 taza de leche descremada.    taza de jugo.   3-4 tabletas de glucosa.   5-6 caramelos duros.   1 caja pequea de pasas de uva.    taza de gaseosa comn.   Mantenga una buena higiene para evitar infecciones.   No fume.  SOLICITE ATENCIN MDICA SI:  Observa prdida vaginal con o sin picazn.   Se siente ms dbil o cansada que lo habitual.   Transpira mucho.   Tiene un aumento de peso repentino, 2,5 kg o ms en una semana.   Pierde peso, 1.5 kg o ms en una semana.   Su nivel de glucosa en sangre es elevado, necesita instrucciones.  SOLICITE ATENCIN MDICA DE  INMEDIATO SI:  Sufre una cefalea intensa.   Se marea o pierde el conocimiento   Presenta nuseas o vmitos.   Se siente desorientada confundida.   Sufre convulsiones.   Tiene problemas de visin.   Siente dolor en el estmago.   Presenta una hemorragia vaginal abundante.   Tiene contracciones uterinas.   Tiene una prdida importante de lquido por la vagina  DESPUS QUE NACE EL BEB:  Concurra a todos los controles de seguimiento y realice los anlisis de sangre segn las indicaciones de su mdico.   Mantenga un estilo de vida saludable para evitar la diabetes en el futuro. Aqu se incluye:   Siga el plan de alimentacin saludable.   Controle su peso.   Practique actividad fsica y descanse lo necesario.   No fume.   Amamante a su beb mientras pueda. Esto disminuir la probabilidad de que usted y su beb sufran diabetes posteriormente.  Para ms informacin acerca de la diabetes, visite la pgina web de la American Diabetes Association: www.americandiabetesassociation.org. Para ms informacin acerca de la diabetes gestacional cite la pgina web del American [employer] of [employer]ians [employer]nec[employer] en: www.acog.org. Document Released: 06/24/2005 Document Revised: 09/03/2011 [employer] Patient Information 2012   [employer].  Embarazo - Tercer trimestre (Pregnancy - Third Trimester) El tercer trimestre del embarazo (los ltimos 3 meses) es el perodo de cambios ms rpidos que atraviesan usted y el beb. El aumento de peso es ms rpido. El beb alcanza un largo de aproximadamente 50 cm (20 pulgadas) y pesa entre 2,700 y 4,500 kg (6 a 10 libras). El beb gana ms tejido graso y ya est listo para la vida fuera del cuerpo de la madre. Mientras estn en el interior, los bebs tienen perodos de sueo y vigilia, succionan el pulgar y tienen hipo. Quizs sienta pequeas contracciones del tero. Este es el falso trabajo de parto. Tambin se las conoce como contracciones de  Braxton-Hicks. Es como una prctica del parto. Los problemas ms habituales de esta etapa del embarazo incluyen mayor dificultad para respirar, hinchazn de las manos y los pies por retencin de lquidos y la necesidad de orinar con ms frecuencia debido a que el tero y el beb presionan sobre la vejiga.  EXAMENES PRENATALES  Durante los exmenes prenatales, deber seguir realizando pruebas de sangre, segn avance el embarazo. Estas pruebas se realizan para controlar su salud y la del beb. Tambin se realizan anlisis de sangre para conocer los niveles de hemoglobina. La anemia (bajo nivel de hemoglobina) es frecuente durante el embarazo. Para prevenirla, se administran hierro y vitaminas. Tambin le harn nuevas pruebas para descartar la diabetes. Podrn repetirle algunas de las pruebas que le hicieron previamente.   En cada visita le medirn el tamao del tero. Es para asegurarse de que el beb se desarrolla correctamente.   Tambin en cada visita la pesarn. Esto se realiza para asegurarse de que aumenta de peso al ritmo indicado y que usted y su beb evolucionan normalmente.   En algunas ocasiones se realiza una ecografa para confirmar el correcto desarrollo y evolucin del beb. Esta prueba se realiza con ondas sonoras inofensivas para el beb, de modo que el profesional pueda calcular con ms precisin la fecha del parto.   Discuta las posibilidades de la anestesia si necesita cesrea.  Algunas veces se realizan pruebas especializadas del lquido amnitico que rodea al beb. Esta prueba se denomina amniocentesis. El lquido amnitico se obtiene introduciendo una aguja en el abdomen (vientre). En ocasiones se lleva a cabo cerca del final del embarazo, si es necesario adelantar el parto. En este caso se realiza para asegurarse de que los pulmones del beb estn lo suficientemente maduros como para que pueda vivir fuera del tero. CAMBIOS QUE OCURREN EN EL TERCER TRIMESTRE DEL EMBARAZO Su  organismo atravesar diferentes cambios durante el embarazo que varan de una persona a otra. Converse con el profesional que la asiste acerca los cambios que usted note y que la preocupen.  Durante el ltimo trimestre probablemente sienta un aumento del apetito. Es normal tener "antojos" de ciertas comidas. Esto vara de una persona a otra y de un embarazo a otro.   Podrn aparecer las primeras estras en las caderas, abdomen y mamas. Estos son cambios normales del cuerpo durante el embarazo. No existen medicamentos ni ejercicios que puedan prevenir estos cambios.   El estreimiento puede tratarse con un laxante o agregando fibra a su dieta. Beber grandes cantidades de lquidos, tomar fibras en forma de verduras, frutas y granos integrales es de gran ayuda.   Tambin es beneficioso practicar actividad fsica. Si ha sido una persona activa hasta el embarazo, podr continuar con la mayora de las actividades durante el mismo. Si ha sido menos   activa, puede ser beneficioso que comience con un programa de ejercicios, como realizar caminatas. Consulte con el profesional que la asiste antes de comenzar un programa de ejercicios.   Evite el consumo de cigarrillos, el alcohol, los medicamentos no prescritos y las "drogas de la calle" durante el embarazo. Estas sustancias qumicas afectan la formacin y el desarrollo del beb. Evite estas sustancias durante todo el embarazo para asegurar el nacimiento de un beb sano.   Dolor de espalda, venas varicosas y hemorroides podran aparecer o empeorar.   Los movimientos del beb pueden ser ms bruscos y aparecer ms a menudo.   Puede que note dificultades para respirar facilmente.   El ombligo podra salrsele hacia afuera.   Puede segregar un lquido amarillento (calostro) de las mamas.   Puede segregar mucus con sangre. Esto normalmente ocurre unos pocos das a una semana antes de que comience el trabajo de parto.  INSTRUCCIONES PARA EL CUIDADO  DOMICILIARIO  La mayor parte de los cuidados que se aconsejan son los mismos que los indicados para las primeras etapas del embarazo. Es importante que concurra a todas las citas con el profesional y siga sus instrucciones con respecto a los medicamentos que deba utilizar, a la actividad fsica y a la dieta.   Durante el embarazo debe obtener nutrientes para usted y para su beb. Consuma alimentos balanceados a intervalos regulares. Elija alimentos como carne, pescado, leche y otros productos lcteos descremados, verduras, frutas, panes integrales y cereales. El profesional le informar cul es el aumento de peso ideal.   Las relaciones sexuales pueden continuarse hasta casi el final del embarazo, si no se presentan otros problemas como prdida prematura (antes de tiempo) de lquido amnitico, hemorragia vaginal o dolor abdominal (en el vientre).   Realice actividad fsica todos los das, si no tiene restricciones. Consulte con el profesional que la asiste si no sabe con certeza si determinados ejercicios son seguros. El mayor aumento de peso se produce en los dos ltimos trimestres del embarazo.   Haga reposo con frecuencia, con las piernas elevadas, o segn lo necesite para evitar los calambres y el dolor de cintura.   Use un buen sostn o como los que se usan para hacer deportes para aliviar la sensibilidad de las mamas. Tambin puede serle til si lo usa mientras duerme. Si pierde calostro, podr utilizar apsitos en el sostn.   No utilice la baera con agua caliente, baos turcos y saunas.   Colquese el cinturn de seguridad cuando conduzca. Este la proteger a usted y al beb en caso de accidente.   Evite comer carne cruda y el contacto con los utensilios y desperdicios de los gatos. Estos elementos contienen grmenes que pueden causar defectos de nacimiento en el beb.   Es fcil perder algo de orina durante el embarazo. Apretar y fortalecer los msculos de la pelvis la ayudar con este  problema. Practique detener la miccin cuando est en el bao. Estos son los mismos msculos que necesita fortalecer. Son tambin los mismos msculos que utiliza cuando trata de evitar los gases. Puede practicar apretando estos msculos diez veces, y repetir esto tres veces por da aproximadamente. Una vez que conozca qu msculos debe contraer, no realice estos ejercicios durante la miccin. Puede favorecerle una infeccin si la orina vuelve hacia atrs.   Pida ayuda si tiene necesidades econmicas, de asesoramiento o nutricionales durante el embarazo. El profesional podr ayudarla con respecto a estas necesidades, o derivarla a otros especialistas.   Practique la ida hasta   el hospital a modo de prueba.   Tome clases prenatales junto con su pareja para comprender, practicar y hacer preguntas acerca del trabajo de parto y el nacimiento.   Prepare la habitacin del beb.   No viaje fuera de la ciudad a menos que sea absolutamente necesario y con el consejo del mdico.   Use slo zapatos bajos sin taco para tener un mejor equilibrio y prevenir cadas.  EL CONSUMO DE MEDICAMENTOS Y DROGAS DURANTE EL EMBARAZO  Contine tomando las vitaminas apropiadas para esta etapa tal como se le indic. Las vitaminas deben contener un miligramo de cido flico y deben suplementarse con hierro. Guarde todas las vitaminas fuera del alcance de los nios. La ingestin de slo un par de vitaminas o comprimidos que contengan hierro pueden ocasionar la muerte en un beb o en un nio pequeo.   Evite el uso de medicamentos, inclusive los de venta libre, que no hayan sido prescritos o indicados por el profesional que la asiste. Algunos medicamentos pueden causar problemas fsicos al beb. Utilice los medicamentos de venta libre o de prescripcin para el dolor, el malestar o la fiebre, segn se lo indique el profesional que lo asiste. No utilice aspirina, ibuprofeno (Motrin, Advil, Nuprin) o naproxeno (Aleve) a menos que  el profesional la autorice.   El alcohol se asocia a cierto nmero de defectos del nacimiento, incluido el sndrome de alcoholismo fetal. Debe evitar el consumo de alcohol en cualquiera de sus formas. El cigarrillo causa nacimientos prematuros y bebs de bajo peso al nacer. Las drogas de la calle son muy nocivas para el beb y estn absolutamente prohibidas. Un beb que nace de una madre adicta, ser adicto al nacer. Ese beb tendr los mismos sntomas de abstinencia que un adulto.   Infrmele al profesional si consume alguna droga.  SOLICITE ATENCIN MDICA SI: Tiene alguna preocupacin durante el embarazo. Es mejor que llame para formular las preguntas si no puede esperar hasta la prxima visita, que sentirse preocupada por ellas.  DECISIONES ACERCA DE LA CIRCUNCISIN Usted puede saber o no cul es el sexo de su beb. Si es un varn, ste es el momento de pensar acerca de la circuncisin. La circuncisin es la extirpacin del prepucio. Esta es la piel que cubre el extremo sensible del pene. No hay un motivo mdico que lo justifique. Generalmente la decisin se toma segn lo que sea popular en ese momento, o se basa en creencias religiosas. Podr conversar estos temas con el profesional que la asiste. SOLICITE ATENCIN MDICA DE INMEDIATO SI:  La temperatura oral se eleva sin motivo por encima de 102 F (38.9 C) o segn le indique el profesional que la asiste.   Tiene una prdida de lquido por la vagina (canal de parto). Si sospecha una ruptura de las membranas, tmese la temperatura y llame al profesional para informarlo sobre esto.   Observa unas pequeas manchas, una hemorragia vaginal o elimina cogulos. Avsele al profesional acerca de la cantidad y de cuntos apsitos est utilizando.   Presenta un olor desagradable en la secrecin vaginal y observa un cambio en el color, de transparente a blanco.   Ha vomitado durante ms de 24 horas.   Presenta escalofros o fiebre.   Comienza a  sentir falta de aire.   Siente ardor al orinar.   Baja o sube ms de 900 g (ms de 2 libras), o segn lo indicado por el profesional que la asiste. Observa que sbitamente se le hinchan el rostro, las manos,   los pies o las piernas.   Presenta dolor abdominal. Las molestias en el ligamento redondo son una causa benigna (no cancerosa) frecuente de dolor abdominal durante el embarazo, pero el profesional que la asiste deber evaluarlo.   Presenta dolor de cabeza intenso que no se alivia.   Si no siente los movimientos del beb durante ms de tres horas. Si piensa que el beb no se mueve tanto como lo haca habitualmente, coma algo que contenga azcar y recustese sobre el lado izquierdo durante una hora. El beb debe moverse al menos 4  5 veces por hora. Comunquese inmediatamente si el beb se mueve menos que lo indicado.   Se cae, se ve involucrada en un accidente automovilstico o sufre algn tipo de traumatismo.   En su hogar hay violencia mental o fsica.  Document Released: 06/24/2005 Document Revised: 09/03/2011 [employer] Patient Information 2012 [employer]. 

## 2011-12-14 NOTE — Progress Notes (Signed)
Patient seen for initial [facility] for GDM.  Discussed risks.  No other concerns.  Good fetal activity

## 2011-12-16 ENCOUNTER — Encounter: Payer: Self-pay | Admitting: Advanced Practice Midwife

## 2011-12-21 ENCOUNTER — Encounter: Payer: Self-pay | Admitting: Dietician

## 2011-12-21 ENCOUNTER — Ambulatory Visit (INDEPENDENT_AMBULATORY_CARE_PROVIDER_SITE_OTHER): Payer: Self-pay | Admitting: Obstetrics & Gynecology

## 2011-12-21 VITALS — BP 102/67 | Temp 97.6°F | Wt 161.9 lb

## 2011-12-21 DIAGNOSIS — O24419 Gestational diabetes mellitus in pregnancy, unspecified control: Secondary | ICD-10-CM

## 2011-12-21 DIAGNOSIS — O99810 Abnormal glucose complicating pregnancy: Secondary | ICD-10-CM

## 2011-12-21 LAB — POCT URINALYSIS DIP (DEVICE)
Bilirubin Urine: NEGATIVE
Glucose, UA: NEGATIVE mg/dL
Hgb urine dipstick: NEGATIVE
Ketones, ur: NEGATIVE mg/dL
Nitrite: NEGATIVE
Specific Gravity, Urine: 1.02 (ref 1.005–1.030)
pH: 7 (ref 5.0–8.0)

## 2011-12-21 NOTE — Progress Notes (Signed)
Pulse: 86

## 2011-12-21 NOTE — Progress Notes (Signed)
Occasional BS in AM up to 130, >100. Eats in middle of night due to hunger, eating light dinner, feels shaky sometimes. Will speak to nutritionist today.

## 2011-12-21 NOTE — Progress Notes (Incomplete)
Diabetes Education:  Come today C/O feelings of shaky, sweaty, hungry at times during the day.  Reports she is following the dietary recommendations and having these symptoms.  Fasting blood glucose is [phone].  Her dinneris at 4:30 PM, she often walks after her meal and then checks her glucose and has a snack of milk and bread or milk and cereal around 7:00 PM and the next morning will have a high fasting level.  Post BK are [id number], Post Lunch:  [id number], Post Dinner:[id number].  Ask that she have a protein source at all meals and especially with the evening snack.  Try to not use cereal for the evening snack.  Will follow-up with her next Monday.  Maggie Nishita Isaacks, RN, RD, CDE

## 2011-12-21 NOTE — Patient Instructions (Signed)
Diabetes mellitus gestacional (Gestational Diabetes Mellitus) La diabetes mellitus gestacional se produce slo durante el embarazo. Aparece cuando el organismo no puede controlar adecuadamente la glucosa (azcar) que aumenta en la sangre despus de comer. Durante el embarazo, se produce una resistencia a la insulina (sensibilidad reducida a la insulina) debido a la liberacin de hormonas por parte de la placenta. Generalmente, el pncreas de una mujer embarazada produce la cantidad suficiente de insulina para vencer esa resistencia. Sin embargo, en la diabetes gestacional, hay insulina pero no cumple su funcin adecuadamente. Si la resistencia es lo suficientemente grave como para que el pncreas no produzca la cantidad de insulina suficiente, la glucosa extra se acumula en la sangre.  QUINES TIENEN RIESGO DE DESARROLLAR DIABETES GESTACIONAL?  Las mujeres con historia de diabetes en la familia.   Las mujeres de ms de 25 aos.   Las que presentan sobrepeso.   Las mujeres que pertenecen a ciertos grupos tnicos (latinas, afroamericanas, norteamericanas nativas, asiticas y las originarias de las islas del [employer].  QUE PUEDE OCURRIRLE AL BEB? Si el nivel de glucosa en sangre de la madre es demasiado elevado mientras este embarazada, el nivel extra de azcar pasar por el cordn umbilical hacia el beb. Algunos de los problemas del beb pueden ser:  Beb demasiado grande: si el nio recibe demasiada azcar, puede aumentar mucho de peso. Esto puede hacer que sea demasiado grande para nacer por parto normal (vaginal) por lo que ser necesario realizar una cesrea.   Bajo nivel de glucosa (hipoglucemia): el beb produce insulina extra en respuesta a la excesiva cantidad de azcar que obtiene de la madre. Cuando el beb nace y ya no necesita insulina extra, su nivel de azcar en sangre puede disminuir.   Ictericia (coloracin amarillenta de la piel y los ojos): esto es bastante frecuente en los  bebs. La causa es la acumulacin de una sustancia qumica denominada bilirrubina. No siempre es un trastorno grave, pero se observa con frecuencia en los bebs cuyas madres sufren diabetes gestacional.  RIESGOS PARA LA MADRE Las mujeres que han sufrido diabetes gestacional pueden tener ms riesgos para algunos problemas como:  Preeclampsia o toxemia, incluyendo problemas con hipertensin arterial. La presin arterial y los niveles de protenas en la orina deben controlarse con frecuencia.   Infecciones   Parto por cesrea.   Aparicin de diabetes tipo 2 en una etapa posterior de la vida. Alrededor del 30% al 50% sufrir diabetes posteriormente, especialmente las que son obesas.  DIAGNSTICO Las hormonas que causan resistencia a la insulina tienen su mayor nivel alrededor de las 24 a 28 semanas del embarazo. Si se experimentan sntomas, stos son similares a los sntomas que normalmente aparecen durante el embarazo.  La diabetes mellitus gestacional generalmente se diagnostica por medio de un mtodo en dos partes: 1. Despus de la 24 a 28 semanas de embarazo, la mujer debe beber una solucin que contiene glucosa y realizar un anlisis de sangre. Si el nivel de glucosa es elevado, la realizarn un segundo anlisis.  2. La prueba oral de tolerancia a la glucosa, que dura aproximadamente tres horas. Despus de realizar ayuno durante la noche, se controla nivel de glucosa en sangre. La mujer bebe una solucin que contiene glucosa y le realizan anlisis de glucosa en sangre cada hora.  Si la mujer tiene factores de riesgos para la diabetes mellitus gestacional, el mdico podr indicar el anlisis antes de las 24 semanas de embarazo. TRATAMIENTO El tratamiento est dirigido a mantener la glucosa en   sangre de la madre en un nivel normal y puede incluir:  La planificacin de los alimentos.   Recibir insulina u otro medicamento para controlar el nivel de glucosa en sangre.   La prctica de ejercicios.    Llevar un registro diario de los alimentos que consume.   Control y registro de los niveles de glucosa en sangre.   Control de los niveles de cetona en la orina, aunque esto ya no se considera necesario en la mayora de los embarazos.  INSTRUCCIONES PARA EL CUIDADO DOMICILIARIO Mientras est embarazada:  Siga los consejos de su mdico relacionados con los controles prenatales, la planificacin de la comida, la actividad fsica, los medicamentos, vitaminas, los anlisis de sangre y otras pruebas y las actividades fsicas.   Lleve un registro de las comidas, las pruebas de glucosa en sangre y la cantidad de insulina que recibe (si corresponde). Muestre todo al profesional en cada consulta mdica prenatal.   Si sufre diabetes mellitus gestacional, podr tener problemas de hipoglucemia (nivel bajo de glucosa en sangre). Podr sospechar este problema si se siente repentinamente mareada, tiene temblores y/o se siente dbil. Si cree que esto le est ocurriendo, y tiene un medidor de glucosa, mida su nivel de glucosa en sangre. Siga los consejos de su mdico sobre el modo y el momento de tratar su nivel de glucosa en sangre. Generalmente se sigue la regla 15:15 Consuma 15 g de hidratos de carbono, espere 15 minutos y vuelva controlar el nivel de glucosa en sangre.. Ejemplos de 15 g de hidratos de carbono son:   1 taza de leche descremada.    taza de jugo.   3-4 tabletas de glucosa.   5-6 caramelos duros.   1 caja pequea de pasas de uva.    taza de gaseosa comn.   Mantenga una buena higiene para evitar infecciones.   No fume.  SOLICITE ATENCIN MDICA SI:  Observa prdida vaginal con o sin picazn.   Se siente ms dbil o cansada que lo habitual.   Transpira mucho.   Tiene un aumento de peso repentino, 2,5 kg o ms en una semana.   Pierde peso, 1.5 kg o ms en una semana.   Su nivel de glucosa en sangre es elevado, necesita instrucciones.  SOLICITE ATENCIN MDICA DE  INMEDIATO SI:  Sufre una cefalea intensa.   Se marea o pierde el conocimiento   Presenta nuseas o vmitos.   Se siente desorientada confundida.   Sufre convulsiones.   Tiene problemas de visin.   Siente dolor en el estmago.   Presenta una hemorragia vaginal abundante.   Tiene contracciones uterinas.   Tiene una prdida importante de lquido por la vagina  DESPUS QUE NACE EL BEB:  Concurra a todos los controles de seguimiento y realice los anlisis de sangre segn las indicaciones de su mdico.   Mantenga un estilo de vida saludable para evitar la diabetes en el futuro. Aqu se incluye:   Siga el plan de alimentacin saludable.   Controle su peso.   Practique actividad fsica y descanse lo necesario.   No fume.   Amamante a su beb mientras pueda. Esto disminuir la probabilidad de que usted y su beb sufran diabetes posteriormente.  Para ms informacin acerca de la diabetes, visite la pgina web de la American Diabetes Association: www.americandiabetesassociation.org. Para ms informacin acerca de la diabetes gestacional cite la pgina web del [employer] en: www.acog.org. Document Released: 06/24/2005 Document Revised: 09/03/2011 [employer] Patient Information 2012   [employer] 

## 2011-12-28 ENCOUNTER — Ambulatory Visit (INDEPENDENT_AMBULATORY_CARE_PROVIDER_SITE_OTHER): Payer: Self-pay | Admitting: Family Medicine

## 2011-12-28 ENCOUNTER — Encounter: Payer: Self-pay | Admitting: Family Medicine

## 2011-12-28 VITALS — BP 101/62 | Temp 96.6°F | Wt 163.4 lb

## 2011-12-28 DIAGNOSIS — O24419 Gestational diabetes mellitus in pregnancy, unspecified control: Secondary | ICD-10-CM

## 2011-12-28 DIAGNOSIS — O209 Hemorrhage in early pregnancy, unspecified: Secondary | ICD-10-CM

## 2011-12-28 DIAGNOSIS — O99810 Abnormal glucose complicating pregnancy: Secondary | ICD-10-CM

## 2011-12-28 LAB — POCT URINALYSIS DIP (DEVICE)
Bilirubin Urine: NEGATIVE
Ketones, ur: NEGATIVE mg/dL
Protein, ur: NEGATIVE mg/dL
Specific Gravity, Urine: >=1.03 (ref 1.005–1.030)
pH: 6 (ref 5.0–8.0)

## 2011-12-28 NOTE — Progress Notes (Signed)
U/S scheduled December 29, 2011 at 245pm.

## 2011-12-28 NOTE — Patient Instructions (Signed)
Diabetes mellitus gestacional (Gestational Diabetes Mellitus) La diabetes mellitus gestacional se produce slo durante el embarazo. Aparece cuando el organismo no puede controlar adecuadamente la glucosa (azcar) que aumenta en la sangre despus de comer. Durante el embarazo, se produce una resistencia a la insulina (sensibilidad reducida a la insulina) debido a la liberacin de hormonas por parte de la placenta. Generalmente, el pncreas de una mujer embarazada produce la cantidad suficiente de insulina para vencer esa resistencia. Sin embargo, en la diabetes gestacional, hay insulina pero no cumple su funcin adecuadamente. Si la resistencia es lo suficientemente grave como para que el pncreas no produzca la cantidad de insulina suficiente, la glucosa extra se acumula en la sangre.  QUINES TIENEN RIESGO DE DESARROLLAR DIABETES GESTACIONAL?  Las mujeres con historia de diabetes en la familia.   Las mujeres de ms de 25 aos.   Las que presentan sobrepeso.   Las mujeres que pertenecen a ciertos grupos tnicos (latinas, afroamericanas, norteamericanas nativas, asiticas y las originarias de las islas del [place].  QUE PUEDE OCURRIRLE AL BEB? Si el nivel de glucosa en sangre de la madre es demasiado elevado mientras este embarazada, el nivel extra de azcar pasar por el cordn umbilical hacia el beb. Algunos de los problemas del beb pueden ser:  Beb demasiado grande: si el nio recibe demasiada azcar, puede aumentar mucho de peso. Esto puede hacer que sea demasiado grande para nacer por parto normal (vaginal) por lo que ser necesario realizar una cesrea.   Bajo nivel de glucosa (hipoglucemia): el beb produce insulina extra en respuesta a la excesiva cantidad de azcar que obtiene de la madre. Cuando el beb nace y ya no necesita insulina extra, su nivel de azcar en sangre puede disminuir.   Ictericia (coloracin amarillenta de la piel y los ojos): esto es bastante frecuente en los  bebs. La causa es la acumulacin de una sustancia qumica denominada bilirrubina. No siempre es un trastorno grave, pero se observa con frecuencia en los bebs cuyas madres sufren diabetes gestacional.  RIESGOS PARA LA MADRE Las mujeres que han sufrido diabetes gestacional pueden tener ms riesgos para algunos problemas como:  Preeclampsia o toxemia, incluyendo problemas con hipertensin arterial. La presin arterial y los niveles de protenas en la orina deben controlarse con frecuencia.   Infecciones   Parto por cesrea.   Aparicin de diabetes tipo 2 en una etapa posterior de la vida. Alrededor del 30% al 50% sufrir diabetes posteriormente, especialmente las que son obesas.  DIAGNSTICO Las hormonas que causan resistencia a la insulina tienen su mayor nivel alrededor de las 24 a 28 semanas del embarazo. Si se experimentan sntomas, stos son similares a los sntomas que normalmente aparecen durante el embarazo.  La diabetes mellitus gestacional generalmente se diagnostica por medio de un mtodo en dos partes: 1. Despus de la 24 a 28 semanas de embarazo, la mujer debe beber una solucin que contiene glucosa y realizar un anlisis de sangre. Si el nivel de glucosa es elevado, la realizarn un segundo anlisis.  2. La prueba oral de tolerancia a la glucosa, que dura aproximadamente tres horas. Despus de realizar ayuno durante la noche, se controla nivel de glucosa en sangre. La mujer bebe una solucin que contiene glucosa y le realizan anlisis de glucosa en sangre cada hora.  Si la mujer tiene factores de riesgos para la diabetes mellitus gestacional, el mdico podr indicar el anlisis antes de las 24 semanas de embarazo. TRATAMIENTO El tratamiento est dirigido a mantener la glucosa en   sangre de la madre en un nivel normal y puede incluir:  La planificacin de los alimentos.   Recibir insulina u otro medicamento para controlar el nivel de glucosa en sangre.   La prctica de ejercicios.    Llevar un registro diario de los alimentos que consume.   Control y registro de los niveles de glucosa en sangre.   Control de los niveles de cetona en la orina, aunque esto ya no se considera necesario en la mayora de los embarazos.  INSTRUCCIONES PARA EL CUIDADO DOMICILIARIO Mientras est embarazada:  Siga los consejos de su mdico relacionados con los controles prenatales, la planificacin de la comida, la actividad fsica, los medicamentos, vitaminas, los anlisis de sangre y otras pruebas y las actividades fsicas.   Lleve un registro de las comidas, las pruebas de glucosa en sangre y la cantidad de insulina que recibe (si corresponde). Muestre todo al profesional en cada consulta mdica prenatal.   Si sufre diabetes mellitus gestacional, podr tener problemas de hipoglucemia (nivel bajo de glucosa en sangre). Podr sospechar este problema si se siente repentinamente mareada, tiene temblores y/o se siente dbil. Si cree que esto le est ocurriendo, y tiene un medidor de glucosa, mida su nivel de glucosa en sangre. Siga los consejos de su mdico sobre el modo y el momento de tratar su nivel de glucosa en sangre. Generalmente se sigue la regla 15:15 Consuma 15 g de hidratos de carbono, espere 15 minutos y vuelva controlar el nivel de glucosa en sangre.. Ejemplos de 15 g de hidratos de carbono son:   1 taza de leche descremada.    taza de jugo.   3-4 tabletas de glucosa.   5-6 caramelos duros.   1 caja pequea de pasas de uva.    taza de gaseosa comn.   Mantenga una buena higiene para evitar infecciones.   No fume.  SOLICITE ATENCIN MDICA SI:  Observa prdida vaginal con o sin picazn.   Se siente ms dbil o cansada que lo habitual.   Transpira mucho.   Tiene un aumento de peso repentino, 2,5 kg o ms en una semana.   Pierde peso, 1.5 kg o ms en una semana.   Su nivel de glucosa en sangre es elevado, necesita instrucciones.  SOLICITE ATENCIN MDICA DE  INMEDIATO SI:  Sufre una cefalea intensa.   Se marea o pierde el conocimiento   Presenta nuseas o vmitos.   Se siente desorientada confundida.   Sufre convulsiones.   Tiene problemas de visin.   Siente dolor en el estmago.   Presenta una hemorragia vaginal abundante.   Tiene contracciones uterinas.   Tiene una prdida importante de lquido por la vagina  DESPUS QUE NACE EL BEB:  Concurra a todos los controles de seguimiento y realice los anlisis de sangre segn las indicaciones de su mdico.   Mantenga un estilo de vida saludable para evitar la diabetes en el futuro. Aqu se incluye:   Siga el plan de alimentacin saludable.   Controle su peso.   Practique actividad fsica y descanse lo necesario.   No fume.   Amamante a su beb mientras pueda. Esto disminuir la probabilidad de que usted y su beb sufran diabetes posteriormente.  Para ms informacin acerca de la diabetes, visite la pgina web de la American Diabetes Association: www.americandiabetesassociation.org. Para ms informacin acerca de la diabetes gestacional cite la pgina web del American Congress of Obst[employer]ians and Gynecologists en: www.acog.org. Document Released: 06/24/2005 Document Revised: 09/03/2011 Exit[employer] Patient Information 2012   [employer]. Eleccin del mtodo anticonceptivo  (Contraception Choices) La anticoncepcin (control de la natalidad) es el uso de cualquier mtodo o dispositivo para evitar el embarazo. A continuacin se indican algunos de esos mtodos.  MTODOS HORMONALES   Implante anticonceptivo. Es un tubo plstico delgado que contiene la hormona progesterona. No contiene estrgenos. El mdico inserta el tubo en la parte interna del brazo. El tubo puede permanecer en el lugar durante 3 aos. Despus de los 3 aos debe retirarse. El implante impide que los ovarios liberen vulos (ovulacin), espesa el moco cervical, lo que evita que los espermatozoides ingresen al tero y  hace ms delgada la membrana que cubre el interior del tero.   Inyecciones de progesterona sola. Estas inyecciones se administran cada 3 meses para evitar el embarazo. La progesterona sinttica impide que los ovarios liberen vulos. Tambin hace que el moco cervical se espese y modifica el recubrimiento interno del tero. Esto hace ms difcil que los espermatozoides sobrevivan en el tero.   Pldoras anticonceptivas. Las pldoras anticonceptivas contienen estrgenos y progesterona. Actan impidiendo que el vulo se forme en el ovario(ovulacin). Las pldoras anticonceptivas son recetadas por el mdico.Tambin se utilizan para tratar los perodos menstruales abundantes.   Minipldora. Este tipo de pldora anticonceptiva contiene slo hormona progesterona. Deben tomarse todos los das del mes y debe recetarlas el mdico.   Parches anticonceptivos. El parche contiene hormonas similares a las que contienen las pldoras anticonceptivas. Deben cambiarse una vez por semana y se utilizan bajo prescripcin mdica.   Anillo vaginal. Anillo vaginal contiene hormonas similares a las que contienen las pldoras anticonceptivas. Se deja colocado durante tres semanas, se lo retira durante 1 semana y luego se coloca uno nuevo. La paciente debe sentirse cmoda para insertar y retirar el anillo de la vagina.Es necesaria la receta del mdico.   Anticonceptivos de emergencia. Los anticonceptivos de emergencia son mtodos para evitar un embarazo despus de una relacin sexual sin proteccin. Esta pldora puede tomarse inmediatamente despus de tener relaciones sexuales o hasta 5 das de haber tenido sexo sin proteccin. Es ms efectiva si se toma poco tiempo despus. Los anticonceptivos de emergencia estn disponibles sin prescripcin mdica. Consltelo con su farmacutico. No use los anticonceptivos de emergencia como nico mtodo anticonceptivo.  MTODOS DE BARRERA   Condn masculino. Es una vaina delgada (ltex o  goma) que se usa en el pene durante el acto sexual. Puede usarse con espermicida para aumentar la efectividad.   Condn femenino. Es una vaina blanda y floja que se adapta suavemente a la vagina antes de las relaciones sexuales.   Diafragma. Es una barrera de ltex redonda y suave que debe ser ajustada por un profesional. Se inserta en la vagina, junto con un gel espermicida. Debe insertarse antes de tener relaciones sexuales. Debe dejar el diafragma colocado en la vagina durante 6 a 8 horas despus de la relacin sexual.   Capuchn cervical. Es una taza de ltex o plstico, redonda y suave que cubre el cuello del tero y debe ser ajustada por un mdico. Puede dejarlo colocado en la vagina hasta 48 horas despus de las relaciones sexuales.   Esponja. Es una pieza blanda y circular de espuma de poliuretano. Contiene un espermicida. Se inserta en la vagina despus de mojarla y antes de las relaciones sexuales.   Espermicidas. Los espermicidas son qumicos que matan o bloquean el esperma y no lo dejan ingresar al cuello del tero y al tero. Vienen en forma de cremas, geles, supositorios, espuma o   comprimidos. No es necesario tener receta mdica. Se insertan en la vagina con un aplicador antes de tener relaciones sexuales. El proceso debe repetirse cada vez que tiene relaciones sexuales.  ANTICONCEPTIVOS INTRAUTERINOS   Dispositivo intrauterino (DIU). Es un dispositivo en forma de T que se coloca en el tero durante el perodo menstrual, para evitar el embarazo. Hay dos tipos:   DIU de cobre. Este tipo de DIU est recubierto con un alambre de cobre y se inserta dentro del tero. El cobre hace que el tero y las trompas de Falopio produzcan un liquido que destruye los espermatozoides. Puede permanecer colocado durante 10 aos.   DIU hormonal. Este tipo de DIU contiene la hormona progestina (progesterona sinttica). La hormona espesa el moco cervical y evita que los espermatozoides ingresen al tero y  tambin afina la membrana que cubre el tero para evitar la implantacin del vulo fertilizado. La hormona debilita o destruye los espermatozoides que ingresan al tero. Puede permanecer colocado durante 5 aos.  MTODOS ANTICONCEPTIVOS PERMANENTES   Ligadura de trompas en la mujer. La ligadura de trompas en la mujer se realiza sellando, atando u obstruyendo quirrgicamente las trompas de Falopio lo que impide que el vulo descienda hacia el tero.   Esterilizacin masculina. Se realiza atando los conductos por los que pasan los espermatozoides (vasectoma).Esto impide que el esperma ingrese a la vagina durante el acto sexual. Luego del procedimiento, el hombre puede eyacular lquido (semen).  MTODOS DE PLANIFICACIN NATURAL   Planificacin familiar natural.  Consiste en no tener relaciones sexuales o usar un mtodo de barrera (condn, diafragma, capuchn cervical) en los das que la mujer podra quedar embarazada.   Mtodo calendario.  Consiste en el seguimiento de la duracin de cada ciclo menstrual y la identificacin de los perodos frtiles.   Mtodo de la ovulacin.  Consiste en evitar las relaciones sexuales durante la ovulacin.   Mtodo sintotrmico. Consiste en evitar las relaciones sexuales en la poca en la que se est ovulando, utilizando un termmetro y tendiendo en cuenta los sntomas de la ovulacin.   Mtodo post-ovulacin. Consiste en planificar las relaciones sexuales para despus de haber ovulado.  Independientemente del tipo o mtodo anticonceptivo que usted elija, es importante que use condones para protegerse contra las enfermedades de transmisin sexual (ETS). Hable con su mdico con respecto a qu mtodo anticonceptivo es el ms apropiado para usted.  Document Released: 09/14/2005 Document Revised: 09/03/2011 [employer] Patient Information 2012 [employer]. Amamantar al beb (Breastfeeding) LOS BENEFICIOS DE AMAMANTAR Para el beb  La primera leche (calostro )  ayuda al mejor funcionamiento del sistema digestivo del beb.   La leche tiene anticuerpos que provienen de la madre y que ayudan a prevenir las infecciones en el beb.   Hay una menor incidencia de asma, enfermedades alrgicas y SMSI (sndrome de muerte sbita nfantil).   Los nutrientes que contiene la leche materna son mejores que las frmulas para el bibern y favorecen el desarrollo cerebral.   Los bebs amamantados sufren menos gases, clicos y constipacin.  Para la mam  La lactancia materna favorece el desarrollo de un vnculo muy especial entre la madre y el beb.   Es ms conveniente, siempre disponible a la temperatura adecuada y ms econmica que la leche maternizada.   Consume caloras en la madre y la ayuda a perder el peso ganado durante el embarazo.   Favorece la contraccin del tero a su tamao normal, de manera ms rpida y disminuye las hemorragias luego del parto.     Las madres que amamantan tienen menor riesgo de desarrollar cncer de mama.  AMAMNTELO CON FRECUENCIA  Un beb sano, nacido a trmino, puede amamantarse con tanta frecuencia como cada hora, o espaciar las comidas cada tres horas.   Esta frecuencia variar de un beb a otro. Observe al beb cuando manifieste signos de hambre, antes que regirse por el reloj.   Amamntelo tan seguido como el beb lo solicite, o cuando usted sienta la necesidad de aliviar sus mamas.   Despierte al beb si han pasado 3  4 horas desde la ltima comida.   El amamantamiento frecuente la ayudar a producir ms leche y a prevenir problemas de dolor en los pezones e hinchazn de las mamas.  LA POSICIN DEL BEB PARA AMAMANTARLO  Ya sea que se encuentre acostada o sentada, asegrese que el abdomen del beb enfrente el suyo.   Sostenga la mama con el pulgar por arriba y el resto de los dedos por debajo. Asegrese que sus dedos se encuentren lejos del pezn y de la boca del beb.   Toque suavemente los labios del beb y la  mejilla ms cercana a la mama con el dedo o el pezn.   Cuando la boca del beb se abra lo suficiente, introduzca el pezn y la zona oscura que lo rodea tanto como le sea posible dentro de la boca.   Coloque a beb cerca suyo de modo que su nariz y mejillas toquen las mamas al mamar.  LAS COMIDAS  La duracin de cada comida vara de un beb a otro y de una comida a otra.   El beb debe succionar alrededor de dos o tres minutos para que le llegue leche. Esto se denomina "bajada". Por este motivo, permita que el nio se alimente en cada mama todo lo que desee. Terminar de mamar cuando haya recibido la cantidad adecuada de nutrientes.   Para detener la succin coloque su dedo en la comisura de la boca del nio y deslcelo entre sus encas antes de quitarle la mama de la boca. Esto la ayudar a evitar el dolor en los pezones.  REDUCIR LA CONGESTIN DE LAS MAMAS  Durante la primera semana despus del parto, usted puede experimentar congestin en las mamas. Cuando las mamas estn congestionadas, se sienten calientes, llenas y molestas al tacto. Puede reducir la congestin si:   Lo amamanta frecuentemente, cada 2-3 horas. Las mams que amamantan pronto y con frecuencia tienen menos problemas de congestin.   Coloque bolsas fras livianas entre cada mamada. Esto ayuda a reducir la hinchazn. Envuelva las bolsas de hielo en una toalla liviana para proteger su piel.   Aplique compresas hmedas calientes sobre la mama durante 5 a 10 minutos antes de amamantar al nio. Esto aumenta la circulacin y ayuda a que la leche fluya.   Masajee suavemente la mama antes y durante la alimentacin.   Asegrese que el nio vaca al menos una mama antes de cambiar de lado.   Use un sacaleche para vaciar la mama si el beb se duerme o no se alimenta bien. Tambin podr quitarse la leche con esta bomba si tiene que volver al trabajo o siente que las mamas estn congestionadas.   Evite los biberones, chupetes o  complementar la alimentacin con agua o jugos en lugar de la leche materna.   Verifique que el beb se encuentra en la posicin correcta mientras lo alimenta.   Evite el cansancio, el estrs y la anemia   Use un soutien que   sostenga bien sus mamas y evite los que tienen aro.   Consuma una dieta balanceada y beba lquidos en cantidad.  Si sigue estas indicaciones, la congestin debe mejorar en 24 a 48 horas. Si an tiene dificultades, consulte a su asesor en lactancia. TENDR SUFICIENTE LECHE MI BEB? Algunas veces las madres se preocupan acerca de si sus bebs tendrn la leche suficiente. Puede asegurarse que el beb tiene la leche suficiente si:  El beb succiona y escucha que traga activamente.   El nio se alimenta al menos 8 a 12 veces en 24 horas. Alimntelo hasta que se desprenda por sus propios medios o se quede dormido en la primera mama (al menos durante 10 a 20 minutos), luego ofrzcale el otro lado.   El beb moja 5 a 6 paales descartables (6 a 8 paales de tela) en 24 horas cuando tiene 5  6 das de vida.   Tiene al menos 2-3 deposiciones todos los das en los primeros meses. La leche materna es todo el alimento que el beb necesita. No es necesario que el nio ingiera agua o preparados de bibern. De hecho, para ayudar a que sus mamas produzcan ms leche, lo mejor es no darle al beb suplementos durante las primeras semanas.   La materia fecal debe ser blanda y amarillenta.   El beb debe aumentar 112 a 196 g por semana.  CUDESE Cuide sus mamas del siguiente modo:  Bese o dchese diariamente.   No lave sus pezones con jabn.   Comience a amamantar del lado izquierdo en una comida y del lado derecho en la siguiente.   Notar que aumenta el suministro de leche a los 2 a 5 das despus del parto. Puede sentir algunas molestias por la congestin, lo que hace que sus mamas estn duras y sensibles. La congestin disminuye en 24 a 48 horas. Mientras tanto, aplique toallas  hmedas calientes durante 5 a 10 minutos antes de amamantar. Un masaje suave y la extraccin de un poco de leche antes de amamantar ablandarn las mamas y har ms fcil que el beb se agarre. Use un buen sostn y seque al aire los pezones durante 10 a 15 minutos luego de cada alimentacin.   Solo utilice apsitos de algodn.   Utilice lanolina pura sobre los pezones luego de amamantar. No necesita lavarlos luego de alimentar al nio.  Cudese del siguiente modo:   Consuma alimentos bien balanceados y refrigerios nutritivos.   Beba leche, jugos de fruta y agua para satisfacer la sed (alrededor de 8 vasos por da).   Descanse lo suficiente.   Aumente la ingesta de calcio en la dieta (1200mg/da).   Evite los alimentos que usted nota que puedan afectar al beb.  SOLICITE ATENCIN MDICA SI:  Tiene preguntas que formular o dificultades con la alimentacin a pecho.   Necesita ayuda.   Observa una zona dura, roja y que le duele en la zona de la mama, y se acompaa de fiebre de 100.5 F (38.1 C) o ms.   El beb est muy somnoliento como para alimentarse bien o tiene problemas para dormir.   El beb moja menos de 6 paales por da, a partir de los 5 das de vida.   La piel del beb o la parte blanca de sus ojos est ms amarilla de lo que estaba en el hospital.   Se siente deprimida.  Document Released: 09/14/2005 Document Revised: 09/03/2011 [employer] Patient Information 2012 [employer]. 

## 2011-12-28 NOTE — Progress Notes (Signed)
Pulse: 82

## 2011-12-28 NOTE — Progress Notes (Signed)
FBS-90-103, most are elevated but are not true fasting 2 hr pp 83-145 9/42 out of range all dietary S>D will check u/s for growth. If LGA or poly--consider medication!

## 2011-12-29 ENCOUNTER — Ambulatory Visit (HOSPITAL_COMMUNITY)
Admission: RE | Admit: 2011-12-29 | Discharge: 2011-12-29 | Disposition: A | Discharge status: Home / Self Care | Payer: Self-pay | Source: Ambulatory Visit | Attending: Family Medicine | Admitting: Family Medicine

## 2011-12-29 DIAGNOSIS — O99810 Abnormal glucose complicating pregnancy: Secondary | ICD-10-CM | POA: Insufficient documentation

## 2011-12-29 DIAGNOSIS — O3660X0 Maternal care for excessive fetal growth, unspecified trimester, not applicable or unspecified: Secondary | ICD-10-CM | POA: Insufficient documentation

## 2011-12-29 DIAGNOSIS — O24419 Gestational diabetes mellitus in pregnancy, unspecified control: Secondary | ICD-10-CM

## 2012-01-04 ENCOUNTER — Encounter: Payer: Self-pay | Attending: Obstetrics and Gynecology | Admitting: Dietician

## 2012-01-04 ENCOUNTER — Ambulatory Visit (INDEPENDENT_AMBULATORY_CARE_PROVIDER_SITE_OTHER): Payer: Self-pay | Admitting: Obstetrics & Gynecology

## 2012-01-04 VITALS — BP 114/75 | Temp 96.9°F | Wt 165.3 lb

## 2012-01-04 DIAGNOSIS — Z713 Dietary counseling and surveillance: Secondary | ICD-10-CM | POA: Insufficient documentation

## 2012-01-04 DIAGNOSIS — O99810 Abnormal glucose complicating pregnancy: Secondary | ICD-10-CM

## 2012-01-04 DIAGNOSIS — O0990 Supervision of high risk pregnancy, unspecified, unspecified trimester: Secondary | ICD-10-CM

## 2012-01-04 LAB — POCT URINALYSIS DIP (DEVICE)
Ketones, ur: NEGATIVE mg/dL
Protein, ur: NEGATIVE mg/dL
Specific Gravity, Urine: 1.02 (ref 1.005–1.030)
Urobilinogen, UA: 0.2 mg/dL (ref 0.0–1.0)
pH: 6.5 (ref 5.0–8.0)

## 2012-01-04 NOTE — Progress Notes (Signed)
Pulse 74. No pain. Pressure in pelvic.

## 2012-01-04 NOTE — Progress Notes (Signed)
Addended by: Adam Phenix on: 01/04/2012 12:00 PM   Modules accepted: Orders

## 2012-01-04 NOTE — Progress Notes (Incomplete)
Diabetes Education:  Provided 1 box of strips ZOX:[id number] Exp:2013/02/11  1 box lancets Lot:[id number]   Exp:2016/01/07.  Review of current glucose readings. Having higher fasting levels.  Has better levels with a tortilla , meat, cheese and a glass of milk.  Levels with this snack are <90 mg.  Post-lunch readings are higher this last week.  She notes that they are higher than the previous week.  Has had the stress of moving this last week.   Will try to walk a little in the later morning and see what this does for the lowing the post-lunch levels.  Maggie Mekhi Lascola RN, RD, CDE0.

## 2012-01-04 NOTE — Progress Notes (Signed)
Addended by: Adam Phenix on: 01/04/2012 11:57 AM   Modules accepted: Orders

## 2012-01-04 NOTE — Progress Notes (Signed)
FBS 86-102, PP 100-143. No contractions, pos. White discharge. 80% ile growth on [place] 12/30/11 cephalic nl AFI. STD probe, GBS, wet prep. Cx posterior, 50/1/vtx

## 2012-01-05 LAB — WET PREP, GENITAL
Trich, Wet Prep: NONE SEEN
Yeast Wet Prep HPF POC: NONE SEEN

## 2012-01-05 LAB — GC/CHLAMYDIA PROBE AMP, GENITAL: Chlamydia, DNA Probe: NEGATIVE

## 2012-01-11 ENCOUNTER — Ambulatory Visit (INDEPENDENT_AMBULATORY_CARE_PROVIDER_SITE_OTHER): Payer: Self-pay | Admitting: Family Medicine

## 2012-01-11 VITALS — BP 96/63 | HR 81 | Temp 96.9°F | Wt 166.0 lb

## 2012-01-11 DIAGNOSIS — Z789 Other specified health status: Secondary | ICD-10-CM

## 2012-01-11 DIAGNOSIS — O24419 Gestational diabetes mellitus in pregnancy, unspecified control: Secondary | ICD-10-CM

## 2012-01-11 DIAGNOSIS — O99810 Abnormal glucose complicating pregnancy: Secondary | ICD-10-CM

## 2012-01-11 DIAGNOSIS — O0990 Supervision of high risk pregnancy, unspecified, unspecified trimester: Secondary | ICD-10-CM

## 2012-01-11 LAB — POCT URINALYSIS DIP (DEVICE)
Bilirubin Urine: NEGATIVE
Glucose, UA: NEGATIVE mg/dL
Hgb urine dipstick: NEGATIVE
Leukocytes, UA: NEGATIVE
Nitrite: NEGATIVE
Urobilinogen, UA: 0.2 mg/dL (ref 0.0–1.0)
pH: 7 (ref 5.0–8.0)

## 2012-01-11 NOTE — Progress Notes (Signed)
Pulse: 81. Edema trace on hands, feet. No c/o pain. Pressure in pelvic. Vaginal d/c described as thin white. C/o seasonal allergies.

## 2012-01-11 NOTE — Progress Notes (Signed)
FBS 86-103 (2 out of range) 2hr pp 88-175 (4 of 21 out of range), risks of high sugar including IUFD discussed today Check u/s for growth next week---Last u/s was 80% on 4/2, AFI 11

## 2012-01-11 NOTE — Patient Instructions (Signed)
Diabetes mellitus gestacional (Gestational Diabetes Mellitus) La diabetes mellitus gestacional se produce slo durante el embarazo. Aparece cuando el organismo no puede controlar adecuadamente la glucosa (azcar) que aumenta en la sangre despus de comer. Durante el embarazo, se produce una resistencia a la insulina (sensibilidad reducida a la insulina) debido a la liberacin de hormonas por parte de la placenta. Generalmente, el pncreas de una mujer embarazada produce la cantidad suficiente de insulina para vencer esa resistencia. Sin embargo, en la diabetes gestacional, hay insulina pero no cumple su funcin adecuadamente. Si la resistencia es lo suficientemente grave como para que el pncreas no produzca la cantidad de insulina suficiente, la glucosa extra se acumula en la sangre.  QUINES TIENEN RIESGO DE DESARROLLAR DIABETES GESTACIONAL?  Las mujeres con historia de diabetes en la familia.   Las mujeres de ms de 25 aos.   Las que presentan sobrepeso.   Las mujeres que pertenecen a ciertos grupos tnicos (latinas, afroamericanas, norteamericanas nativas, asiticas y las originarias de las islas del [place].  QUE PUEDE OCURRIRLE AL BEB? Si el nivel de glucosa en sangre de la madre es demasiado elevado mientras este embarazada, el nivel extra de azcar pasar por el cordn umbilical hacia el beb. Algunos de los problemas del beb pueden ser:  Beb demasiado grande: si el nio recibe demasiada azcar, puede aumentar mucho de peso. Esto puede hacer que sea demasiado grande para nacer por parto normal (vaginal) por lo que ser necesario realizar una cesrea.   Bajo nivel de glucosa (hipoglucemia): el beb produce insulina extra en respuesta a la excesiva cantidad de azcar que obtiene de la madre. Cuando el beb nace y ya no necesita insulina extra, su nivel de azcar en sangre puede disminuir.   Ictericia (coloracin amarillenta de la piel y los ojos): esto es bastante frecuente en los  bebs. La causa es la acumulacin de una sustancia qumica denominada bilirrubina. No siempre es un trastorno grave, pero se observa con frecuencia en los bebs cuyas madres sufren diabetes gestacional.  RIESGOS PARA LA MADRE Las mujeres que han sufrido diabetes gestacional pueden tener ms riesgos para algunos problemas como:  Preeclampsia o toxemia, incluyendo problemas con hipertensin arterial. La presin arterial y los niveles de protenas en la orina deben controlarse con frecuencia.   Infecciones   Parto por cesrea.   Aparicin de diabetes tipo 2 en una etapa posterior de la vida. Alrededor del 30% al 50% sufrir diabetes posteriormente, especialmente las que son obesas.  DIAGNSTICO Las hormonas que causan resistencia a la insulina tienen su mayor nivel alrededor de las 24 a 28 semanas del embarazo. Si se experimentan sntomas, stos son similares a los sntomas que normalmente aparecen durante el embarazo.  La diabetes mellitus gestacional generalmente se diagnostica por medio de un mtodo en dos partes: 1. Despus de la 24 a 28 semanas de embarazo, la mujer debe beber una solucin que contiene glucosa y realizar un anlisis de sangre. Si el nivel de glucosa es elevado, la realizarn un segundo anlisis.  2. La prueba oral de tolerancia a la glucosa, que dura aproximadamente tres horas. Despus de realizar ayuno durante la noche, se controla nivel de glucosa en sangre. La mujer bebe una solucin que contiene glucosa y le realizan anlisis de glucosa en sangre cada hora.  Si la mujer tiene factores de riesgos para la diabetes mellitus gestacional, el mdico podr indicar el anlisis antes de las 24 semanas de embarazo. TRATAMIENTO El tratamiento est dirigido a mantener la glucosa en   sangre de la madre en un nivel normal y puede incluir:  La planificacin de los alimentos.   Recibir insulina u otro medicamento para controlar el nivel de glucosa en sangre.   La prctica de ejercicios.    Llevar un registro diario de los alimentos que consume.   Control y registro de los niveles de glucosa en sangre.   Control de los niveles de cetona en la orina, aunque esto ya no se considera necesario en la mayora de los embarazos.  INSTRUCCIONES PARA EL CUIDADO DOMICILIARIO Mientras est embarazada:  Siga los consejos de su mdico relacionados con los controles prenatales, la planificacin de la comida, la actividad fsica, los medicamentos, vitaminas, los anlisis de sangre y otras pruebas y las actividades fsicas.   Lleve un registro de las comidas, las pruebas de glucosa en sangre y la cantidad de insulina que recibe (si corresponde). Muestre todo al profesional en cada consulta mdica prenatal.   Si sufre diabetes mellitus gestacional, podr tener problemas de hipoglucemia (nivel bajo de glucosa en sangre). Podr sospechar este problema si se siente repentinamente mareada, tiene temblores y/o se siente dbil. Si cree que esto le est ocurriendo, y tiene un medidor de glucosa, mida su nivel de glucosa en sangre. Siga los consejos de su mdico sobre el modo y el momento de tratar su nivel de glucosa en sangre. Generalmente se sigue la regla 15:15 Consuma 15 g de hidratos de carbono, espere 15 minutos y vuelva controlar el nivel de glucosa en sangre.. Ejemplos de 15 g de hidratos de carbono son:   1 taza de leche descremada.    taza de jugo.   3-4 tabletas de glucosa.   5-6 caramelos duros.   1 caja pequea de pasas de uva.    taza de gaseosa comn.   Mantenga una buena higiene para evitar infecciones.   No fume.  SOLICITE ATENCIN MDICA SI:  Observa prdida vaginal con o sin picazn.   Se siente ms dbil o cansada que lo habitual.   Transpira mucho.   Tiene un aumento de peso repentino, 2,5 kg o ms en una semana.   Pierde peso, 1.5 kg o ms en una semana.   Su nivel de glucosa en sangre es elevado, necesita instrucciones.  SOLICITE ATENCIN MDICA DE  INMEDIATO SI:  Sufre una cefalea intensa.   Se marea o pierde el conocimiento   Presenta nuseas o vmitos.   Se siente desorientada confundida.   Sufre convulsiones.   Tiene problemas de visin.   Siente dolor en el estmago.   Presenta una hemorragia vaginal abundante.   Tiene contracciones uterinas.   Tiene una prdida importante de lquido por la vagina  DESPUS QUE NACE EL BEB:  Concurra a todos los controles de seguimiento y realice los anlisis de sangre segn las indicaciones de su mdico.   Mantenga un estilo de vida saludable para evitar la diabetes en el futuro. Aqu se incluye:   Siga el plan de alimentacin saludable.   Controle su peso.   Practique actividad fsica y descanse lo necesario.   No fume.   Amamante a su beb mientras pueda. Esto disminuir la probabilidad de que usted y su beb sufran diabetes posteriormente.  Para ms informacin acerca de la diabetes, visite la pgina web de la American Diabetes Association: www.americandiabetesassociation.org. Para ms informacin acerca de la diabetes gestacional cite la pgina web del American Congress of Obst[employer]ians and Gynecologists en: www.acog.org. Document Released: 06/24/2005 Document Revised: 09/03/2011 Exit[employer] Patient Information 2012   [employer]. Eleccin del mtodo anticonceptivo  (Contraception Choices) La anticoncepcin (control de la natalidad) es el uso de cualquier mtodo o dispositivo para evitar el embarazo. A continuacin se indican algunos de esos mtodos.  MTODOS HORMONALES   Implante anticonceptivo. Es un tubo plstico delgado que contiene la hormona progesterona. No contiene estrgenos. El mdico inserta el tubo en la parte interna del brazo. El tubo puede permanecer en el lugar durante 3 aos. Despus de los 3 aos debe retirarse. El implante impide que los ovarios liberen vulos (ovulacin), espesa el moco cervical, lo que evita que los espermatozoides ingresen al tero y  hace ms delgada la membrana que cubre el interior del tero.   Inyecciones de progesterona sola. Estas inyecciones se administran cada 3 meses para evitar el embarazo. La progesterona sinttica impide que los ovarios liberen vulos. Tambin hace que el moco cervical se espese y modifica el recubrimiento interno del tero. Esto hace ms difcil que los espermatozoides sobrevivan en el tero.   Pldoras anticonceptivas. Las pldoras anticonceptivas contienen estrgenos y progesterona. Actan impidiendo que el vulo se forme en el ovario(ovulacin). Las pldoras anticonceptivas son recetadas por el mdico.Tambin se utilizan para tratar los perodos menstruales abundantes.   Minipldora. Este tipo de pldora anticonceptiva contiene slo hormona progesterona. Deben tomarse todos los das del mes y debe recetarlas el mdico.   Parches anticonceptivos. El parche contiene hormonas similares a las que contienen las pldoras anticonceptivas. Deben cambiarse una vez por semana y se utilizan bajo prescripcin mdica.   Anillo vaginal. Anillo vaginal contiene hormonas similares a las que contienen las pldoras anticonceptivas. Se deja colocado durante tres semanas, se lo retira durante 1 semana y luego se coloca uno nuevo. La paciente debe sentirse cmoda para insertar y retirar el anillo de la vagina.Es necesaria la receta del mdico.   Anticonceptivos de emergencia. Los anticonceptivos de emergencia son mtodos para evitar un embarazo despus de una relacin sexual sin proteccin. Esta pldora puede tomarse inmediatamente despus de tener relaciones sexuales o hasta 5 das de haber tenido sexo sin proteccin. Es ms efectiva si se toma poco tiempo despus. Los anticonceptivos de emergencia estn disponibles sin prescripcin mdica. Consltelo con su farmacutico. No use los anticonceptivos de emergencia como nico mtodo anticonceptivo.  MTODOS DE BARRERA   Condn masculino. Es una vaina delgada (ltex o  goma) que se usa en el pene durante el acto sexual. Puede usarse con espermicida para aumentar la efectividad.   Condn femenino. Es una vaina blanda y floja que se adapta suavemente a la vagina antes de las relaciones sexuales.   Diafragma. Es una barrera de ltex redonda y suave que debe ser ajustada por un profesional. Se inserta en la vagina, junto con un gel espermicida. Debe insertarse antes de tener relaciones sexuales. Debe dejar el diafragma colocado en la vagina durante 6 a 8 horas despus de la relacin sexual.   Capuchn cervical. Es una taza de ltex o plstico, redonda y suave que cubre el cuello del tero y debe ser ajustada por un mdico. Puede dejarlo colocado en la vagina hasta 48 horas despus de las relaciones sexuales.   Esponja. Es una pieza blanda y circular de espuma de poliuretano. Contiene un espermicida. Se inserta en la vagina despus de mojarla y antes de las relaciones sexuales.   Espermicidas. Los espermicidas son qumicos que matan o bloquean el esperma y no lo dejan ingresar al cuello del tero y al tero. Vienen en forma de cremas, geles, supositorios, espuma o   comprimidos. No es necesario tener receta mdica. Se insertan en la vagina con un aplicador antes de tener relaciones sexuales. El proceso debe repetirse cada vez que tiene relaciones sexuales.  ANTICONCEPTIVOS INTRAUTERINOS   Dispositivo intrauterino (DIU). Es un dispositivo en forma de T que se coloca en el tero durante el perodo menstrual, para evitar el embarazo. Hay dos tipos:   DIU de cobre. Este tipo de DIU est recubierto con un alambre de cobre y se inserta dentro del tero. El cobre hace que el tero y las trompas de Falopio produzcan un liquido que destruye los espermatozoides. Puede permanecer colocado durante 10 aos.   DIU hormonal. Este tipo de DIU contiene la hormona progestina (progesterona sinttica). La hormona espesa el moco cervical y evita que los espermatozoides ingresen al tero y  tambin afina la membrana que cubre el tero para evitar la implantacin del vulo fertilizado. La hormona debilita o destruye los espermatozoides que ingresan al tero. Puede permanecer colocado durante 5 aos.  MTODOS ANTICONCEPTIVOS PERMANENTES   Ligadura de trompas en la mujer. La ligadura de trompas en la mujer se realiza sellando, atando u obstruyendo quirrgicamente las trompas de Falopio lo que impide que el vulo descienda hacia el tero.   Esterilizacin masculina. Se realiza atando los conductos por los que pasan los espermatozoides (vasectoma).Esto impide que el esperma ingrese a la vagina durante el acto sexual. Luego del procedimiento, el hombre puede eyacular lquido (semen).  MTODOS DE PLANIFICACIN NATURAL   Planificacin familiar natural.  Consiste en no tener relaciones sexuales o usar un mtodo de barrera (condn, diafragma, capuchn cervical) en los das que la mujer podra quedar embarazada.   Mtodo calendario.  Consiste en el seguimiento de la duracin de cada ciclo menstrual y la identificacin de los perodos frtiles.   Mtodo de la ovulacin.  Consiste en evitar las relaciones sexuales durante la ovulacin.   Mtodo sintotrmico. Consiste en evitar las relaciones sexuales en la poca en la que se est ovulando, utilizando un termmetro y tendiendo en cuenta los sntomas de la ovulacin.   Mtodo post-ovulacin. Consiste en planificar las relaciones sexuales para despus de haber ovulado.  Independientemente del tipo o mtodo anticonceptivo que usted elija, es importante que use condones para protegerse contra las enfermedades de transmisin sexual (ETS). Hable con su mdico con respecto a qu mtodo anticonceptivo es el ms apropiado para usted.  Document Released: 09/14/2005 Document Revised: 09/03/2011 [employer] Patient Information 2012 [employer]. Amamantar al beb (Breastfeeding) LOS BENEFICIOS DE AMAMANTAR Para el beb  La primera leche (calostro )  ayuda al mejor funcionamiento del sistema digestivo del beb.   La leche tiene anticuerpos que provienen de la madre y que ayudan a prevenir las infecciones en el beb.   Hay una menor incidencia de asma, enfermedades alrgicas y SMSI (sndrome de muerte sbita nfantil).   Los nutrientes que contiene la leche materna son mejores que las frmulas para el bibern y favorecen el desarrollo cerebral.   Los bebs amamantados sufren menos gases, clicos y constipacin.  Para la mam  La lactancia materna favorece el desarrollo de un vnculo muy especial entre la madre y el beb.   Es ms conveniente, siempre disponible a la temperatura adecuada y ms econmica que la leche maternizada.   Consume caloras en la madre y la ayuda a perder el peso ganado durante el embarazo.   Favorece la contraccin del tero a su tamao normal, de manera ms rpida y disminuye las hemorragias luego del parto.     Las madres que amamantan tienen menor riesgo de desarrollar cncer de mama.  AMAMNTELO CON FRECUENCIA  Un beb sano, nacido a trmino, puede amamantarse con tanta frecuencia como cada hora, o espaciar las comidas cada tres horas.   Esta frecuencia variar de un beb a otro. Observe al beb cuando manifieste signos de hambre, antes que regirse por el reloj.   Amamntelo tan seguido como el beb lo solicite, o cuando usted sienta la necesidad de aliviar sus mamas.   Despierte al beb si han pasado 3  4 horas desde la ltima comida.   El amamantamiento frecuente la ayudar a producir ms leche y a prevenir problemas de dolor en los pezones e hinchazn de las mamas.  LA POSICIN DEL BEB PARA AMAMANTARLO  Ya sea que se encuentre acostada o sentada, asegrese que el abdomen del beb enfrente el suyo.   Sostenga la mama con el pulgar por arriba y el resto de los dedos por debajo. Asegrese que sus dedos se encuentren lejos del pezn y de la boca del beb.   Toque suavemente los labios del beb y la  mejilla ms cercana a la mama con el dedo o el pezn.   Cuando la boca del beb se abra lo suficiente, introduzca el pezn y la zona oscura que lo rodea tanto como le sea posible dentro de la boca.   Coloque a beb cerca suyo de modo que su nariz y mejillas toquen las mamas al mamar.  LAS COMIDAS  La duracin de cada comida vara de un beb a otro y de una comida a otra.   El beb debe succionar alrededor de dos o tres minutos para que le llegue leche. Esto se denomina "bajada". Por este motivo, permita que el nio se alimente en cada mama todo lo que desee. Terminar de mamar cuando haya recibido la cantidad adecuada de nutrientes.   Para detener la succin coloque su dedo en la comisura de la boca del nio y deslcelo entre sus encas antes de quitarle la mama de la boca. Esto la ayudar a evitar el dolor en los pezones.  REDUCIR LA CONGESTIN DE LAS MAMAS  Durante la primera semana despus del parto, usted puede experimentar congestin en las mamas. Cuando las mamas estn congestionadas, se sienten calientes, llenas y molestas al tacto. Puede reducir la congestin si:   Lo amamanta frecuentemente, cada 2-3 horas. Las mams que amamantan pronto y con frecuencia tienen menos problemas de congestin.   Coloque bolsas fras livianas entre cada mamada. Esto ayuda a reducir la hinchazn. Envuelva las bolsas de hielo en una toalla liviana para proteger su piel.   Aplique compresas hmedas calientes sobre la mama durante 5 a 10 minutos antes de amamantar al nio. Esto aumenta la circulacin y ayuda a que la leche fluya.   Masajee suavemente la mama antes y durante la alimentacin.   Asegrese que el nio vaca al menos una mama antes de cambiar de lado.   Use un sacaleche para vaciar la mama si el beb se duerme o no se alimenta bien. Tambin podr quitarse la leche con esta bomba si tiene que volver al trabajo o siente que las mamas estn congestionadas.   Evite los biberones, chupetes o  complementar la alimentacin con agua o jugos en lugar de la leche materna.   Verifique que el beb se encuentra en la posicin correcta mientras lo alimenta.   Evite el cansancio, el estrs y la anemia   Use un soutien que   sostenga bien sus mamas y evite los que tienen aro.   Consuma una dieta balanceada y beba lquidos en cantidad.  Si sigue estas indicaciones, la congestin debe mejorar en 24 a 48 horas. Si an tiene dificultades, consulte a su asesor en lactancia. TENDR SUFICIENTE LECHE MI BEB? Algunas veces las madres se preocupan acerca de si sus bebs tendrn la leche suficiente. Puede asegurarse que el beb tiene la leche suficiente si:  El beb succiona y escucha que traga activamente.   El nio se alimenta al menos 8 a 12 veces en 24 horas. Alimntelo hasta que se desprenda por sus propios medios o se quede dormido en la primera mama (al menos durante 10 a 20 minutos), luego ofrzcale el otro lado.   El beb moja 5 a 6 paales descartables (6 a 8 paales de tela) en 24 horas cuando tiene 5  6 das de vida.   Tiene al menos 2-3 deposiciones todos los das en los primeros meses. La leche materna es todo el alimento que el beb necesita. No es necesario que el nio ingiera agua o preparados de bibern. De hecho, para ayudar a que sus mamas produzcan ms leche, lo mejor es no darle al beb suplementos durante las primeras semanas.   La materia fecal debe ser blanda y amarillenta.   El beb debe aumentar 112 a 196 g por semana.  CUDESE Cuide sus mamas del siguiente modo:  Bese o dchese diariamente.   No lave sus pezones con jabn.   Comience a amamantar del lado izquierdo en una comida y del lado derecho en la siguiente.   Notar que aumenta el suministro de leche a los 2 a 5 das despus del parto. Puede sentir algunas molestias por la congestin, lo que hace que sus mamas estn duras y sensibles. La congestin disminuye en 24 a 48 horas. Mientras tanto, aplique toallas  hmedas calientes durante 5 a 10 minutos antes de amamantar. Un masaje suave y la extraccin de un poco de leche antes de amamantar ablandarn las mamas y har ms fcil que el beb se agarre. Use un buen sostn y seque al aire los pezones durante 10 a 15 minutos luego de cada alimentacin.   Solo utilice apsitos de algodn.   Utilice lanolina pura sobre los pezones luego de amamantar. No necesita lavarlos luego de alimentar al nio.  Cudese del siguiente modo:   Consuma alimentos bien balanceados y refrigerios nutritivos.   Beba leche, jugos de fruta y agua para satisfacer la sed (alrededor de 8 vasos por da).   Descanse lo suficiente.   Aumente la ingesta de calcio en la dieta (1200mg/da).   Evite los alimentos que usted nota que puedan afectar al beb.  SOLICITE ATENCIN MDICA SI:  Tiene preguntas que formular o dificultades con la alimentacin a pecho.   Necesita ayuda.   Observa una zona dura, roja y que le duele en la zona de la mama, y se acompaa de fiebre de 100.5 F (38.1 C) o ms.   El beb est muy somnoliento como para alimentarse bien o tiene problemas para dormir.   El beb moja menos de 6 paales por da, a partir de los 5 das de vida.   La piel del beb o la parte blanca de sus ojos est ms amarilla de lo que estaba en el hospital.   Se siente deprimida.  Document Released: 09/14/2005 Document Revised: 09/03/2011 [employer] Patient Information 2012 [employer]. 

## 2012-01-18 ENCOUNTER — Encounter (HOSPITAL_COMMUNITY): Payer: Self-pay | Admitting: *Deleted

## 2012-01-18 ENCOUNTER — Encounter: Payer: Self-pay | Admitting: Dietician

## 2012-01-18 ENCOUNTER — Ambulatory Visit (INDEPENDENT_AMBULATORY_CARE_PROVIDER_SITE_OTHER): Payer: Self-pay | Admitting: Family

## 2012-01-18 ENCOUNTER — Telehealth (HOSPITAL_COMMUNITY): Payer: Self-pay | Admitting: *Deleted

## 2012-01-18 VITALS — BP 98/70 | Temp 97.4°F | Wt 168.1 lb

## 2012-01-18 DIAGNOSIS — O99810 Abnormal glucose complicating pregnancy: Secondary | ICD-10-CM

## 2012-01-18 DIAGNOSIS — O24419 Gestational diabetes mellitus in pregnancy, unspecified control: Secondary | ICD-10-CM

## 2012-01-18 DIAGNOSIS — O0990 Supervision of high risk pregnancy, unspecified, unspecified trimester: Secondary | ICD-10-CM

## 2012-01-18 LAB — POCT URINALYSIS DIP (DEVICE)
Bilirubin Urine: NEGATIVE
Hgb urine dipstick: NEGATIVE
Nitrite: NEGATIVE
pH: 7 (ref 5.0–8.0)

## 2012-01-18 MED ORDER — GLYBURIDE 2.5 MG PO TABS: 2.5000 mg | ORAL_TABLET | Freq: Every day | ORAL | Status: DC

## 2012-01-18 NOTE — Progress Notes (Signed)
Diabetes Education: Provided 1 box of True Track Strips LOT: [id number] EXP: 2013/02/11. Maggie Ashling Roane, RN, RD, CDE

## 2012-01-18 NOTE — Telephone Encounter (Signed)
Preadmission screen [phone] Translator number

## 2012-01-18 NOTE — Progress Notes (Signed)
FBS 86-104 (6 out of range); 95-151 (6 abn); discussed diet at night, eating protein, however may eat with tortilla and glass of milk; recommended cutting out one of the carbs; walks every day; schedule growth ultrasound/bpp/AFI this week; NST today due to majority of FBS elevated and fundal height of 41; begin glyburide 2.5mg  hs; consulted with Dr. Greer Pickerel with plan

## 2012-01-18 NOTE — Progress Notes (Signed)
Pulse 86 Some swelling in feet. Has pelvic pressure.

## 2012-01-21 ENCOUNTER — Ambulatory Visit (HOSPITAL_COMMUNITY)
Admission: RE | Admit: 2012-01-21 | Discharge: 2012-01-21 | Disposition: A | Discharge status: Home / Self Care | Payer: Self-pay | Source: Ambulatory Visit | Attending: Family | Admitting: Family

## 2012-01-21 DIAGNOSIS — O3660X0 Maternal care for excessive fetal growth, unspecified trimester, not applicable or unspecified: Secondary | ICD-10-CM | POA: Insufficient documentation

## 2012-01-21 DIAGNOSIS — O0990 Supervision of high risk pregnancy, unspecified, unspecified trimester: Secondary | ICD-10-CM

## 2012-01-21 DIAGNOSIS — O99810 Abnormal glucose complicating pregnancy: Secondary | ICD-10-CM | POA: Insufficient documentation

## 2012-01-25 ENCOUNTER — Ambulatory Visit (INDEPENDENT_AMBULATORY_CARE_PROVIDER_SITE_OTHER): Payer: Self-pay | Admitting: Family Medicine

## 2012-01-25 VITALS — BP 108/72 | Temp 98.9°F | Wt 168.7 lb

## 2012-01-25 DIAGNOSIS — O24419 Gestational diabetes mellitus in pregnancy, unspecified control: Secondary | ICD-10-CM

## 2012-01-25 DIAGNOSIS — O99810 Abnormal glucose complicating pregnancy: Secondary | ICD-10-CM

## 2012-01-25 LAB — POCT URINALYSIS DIP (DEVICE)
Bilirubin Urine: NEGATIVE
Glucose, UA: NEGATIVE mg/dL
Hgb urine dipstick: NEGATIVE
Nitrite: NEGATIVE
Specific Gravity, Urine: 1.015 (ref 1.005–1.030)

## 2012-01-25 NOTE — Progress Notes (Signed)
Pt reports some decr. FM since last week - felt good FM during NST today.  IOL scheduled tomorrow @ [phone]

## 2012-01-25 NOTE — Patient Instructions (Signed)
Eleccin del mtodo anticonceptivo  (Contraception Choices) La anticoncepcin (control de la natalidad) es el uso de cualquier mtodo o dispositivo para evitar el embarazo. A continuacin se indican algunos de esos mtodos.  MTODOS HORMONALES   Implante anticonceptivo. Es un tubo plstico delgado que contiene la hormona progesterona. No contiene estrgenos. El mdico inserta el tubo en la parte interna del brazo. El tubo puede permanecer en el lugar durante 3 aos. Despus de los 3 aos debe retirarse. El implante impide que los ovarios liberen vulos (ovulacin), espesa el moco cervical, lo que evita que los espermatozoides ingresen al tero y hace ms delgada la membrana que cubre el interior del tero.   Inyecciones de progesterona sola. Estas inyecciones se administran cada 3 meses para evitar el embarazo. La progesterona sinttica impide que los ovarios liberen vulos. Tambin hace que el moco cervical se espese y modifica el recubrimiento interno del tero. Esto hace ms difcil que los espermatozoides sobrevivan en el tero.   Pldoras anticonceptivas. Las pldoras anticonceptivas contienen estrgenos y progesterona. Actan impidiendo que el vulo se forme en el ovario(ovulacin). Las pldoras anticonceptivas son recetadas por el mdico.Tambin se utilizan para tratar los perodos menstruales abundantes.   Minipldora. Este tipo de pldora anticonceptiva contiene slo hormona progesterona. Deben tomarse todos los das del mes y debe recetarlas el mdico.   Parches anticonceptivos. El parche contiene hormonas similares a las que contienen las pldoras anticonceptivas. Deben cambiarse una vez por semana y se utilizan bajo prescripcin mdica.   Anillo vaginal. Anillo vaginal contiene hormonas similares a las que contienen las pldoras anticonceptivas. Se deja colocado durante tres semanas, se lo retira durante 1 semana y luego se coloca uno nuevo. La paciente debe sentirse cmoda para insertar  y retirar el anillo de la vagina.Es necesaria la receta del mdico.   Anticonceptivos de emergencia. Los anticonceptivos de emergencia son mtodos para evitar un embarazo despus de una relacin sexual sin proteccin. Esta pldora puede tomarse inmediatamente despus de tener relaciones sexuales o hasta 5 das de haber tenido sexo sin proteccin. Es ms efectiva si se toma poco tiempo despus. Los anticonceptivos de emergencia estn disponibles sin prescripcin mdica. Consltelo con su farmacutico. No use los anticonceptivos de emergencia como nico mtodo anticonceptivo.  MTODOS DE BARRERA   Condn masculino. Es una vaina delgada (ltex o goma) que se usa en el pene durante el acto sexual. Puede usarse con espermicida para aumentar la efectividad.   Condn femenino. Es una vaina blanda y floja que se adapta suavemente a la vagina antes de las relaciones sexuales.   Diafragma. Es una barrera de ltex redonda y suave que debe ser ajustada por un profesional. Se inserta en la vagina, junto con un gel espermicida. Debe insertarse antes de tener relaciones sexuales. Debe dejar el diafragma colocado en la vagina durante 6 a 8 horas despus de la relacin sexual.   Capuchn cervical. Es una taza de ltex o plstico, redonda y suave que cubre el cuello del tero y debe ser ajustada por un mdico. Puede dejarlo colocado en la vagina hasta 48 horas despus de las relaciones sexuales.   Esponja. Es una pieza blanda y circular de espuma de poliuretano. Contiene un espermicida. Se inserta en la vagina despus de mojarla y antes de las relaciones sexuales.   Espermicidas. Los espermicidas son qumicos que matan o bloquean el esperma y no lo dejan ingresar al cuello del tero y al tero. Vienen en forma de cremas, geles, supositorios, espuma o comprimidos. No es   necesario tener receta mdica. Se insertan en la vagina con un aplicador antes de tener relaciones sexuales. El proceso debe repetirse cada vez que  tiene relaciones sexuales.  ANTICONCEPTIVOS INTRAUTERINOS   Dispositivo intrauterino (DIU). Es un dispositivo en forma de T que se coloca en el tero durante el perodo menstrual, para evitar el embarazo. Hay dos tipos:   DIU de cobre. Este tipo de DIU est recubierto con un alambre de cobre y se inserta dentro del tero. El cobre hace que el tero y las trompas de Falopio produzcan un liquido que destruye los espermatozoides. Puede permanecer colocado durante 10 aos.   DIU hormonal. Este tipo de DIU contiene la hormona progestina (progesterona sinttica). La hormona espesa el moco cervical y evita que los espermatozoides ingresen al tero y tambin afina la membrana que cubre el tero para evitar la implantacin del vulo fertilizado. La hormona debilita o destruye los espermatozoides que ingresan al tero. Puede permanecer colocado durante 5 aos.  MTODOS ANTICONCEPTIVOS PERMANENTES   Ligadura de trompas en la mujer. La ligadura de trompas en la mujer se realiza sellando, atando u obstruyendo quirrgicamente las trompas de Falopio lo que impide que el vulo descienda hacia el tero.   Esterilizacin masculina. Se realiza atando los conductos por los que pasan los espermatozoides (vasectoma).Esto impide que el esperma ingrese a la vagina durante el acto sexual. Luego del procedimiento, el hombre puede eyacular lquido (semen).  MTODOS DE PLANIFICACIN NATURAL   Planificacin familiar natural.  Consiste en no tener relaciones sexuales o usar un mtodo de barrera (condn, diafragma, capuchn cervical) en los das que la mujer podra quedar embarazada.   Mtodo calendario.  Consiste en el seguimiento de la duracin de cada ciclo menstrual y la identificacin de los perodos frtiles.   Mtodo de la ovulacin.  Consiste en evitar las relaciones sexuales durante la ovulacin.   Mtodo sintotrmico. Consiste en evitar las relaciones sexuales en la poca en la que se est ovulando, utilizando un  termmetro y tendiendo en cuenta los sntomas de la ovulacin.   Mtodo post-ovulacin. Consiste en planificar las relaciones sexuales para despus de haber ovulado.  Independientemente del tipo o mtodo anticonceptivo que usted elija, es importante que use condones para protegerse contra las enfermedades de transmisin sexual (ETS). Hable con su mdico con respecto a qu mtodo anticonceptivo es el ms apropiado para usted.  Document Released: 09/14/2005 Document Revised: 09/03/2011 [employer] Patient Information 2012 [employer].Amamantar al beb (Breastfeeding) LOS BENEFICIOS DE AMAMANTAR Para el beb  La primera leche (calostro ) ayuda al mejor funcionamiento del sistema digestivo del beb.   La leche tiene anticuerpos que provienen de la madre y que ayudan a prevenir las infecciones en el beb.   Hay una menor incidencia de asma, enfermedades alrgicas y SMSI (sndrome de muerte sbita nfantil).   Los nutrientes que contiene la leche materna son mejores que las frmulas para el bibern y favorecen el desarrollo cerebral.   Los bebs amamantados sufren menos gases, clicos y constipacin.  Para la mam  La lactancia materna favorece el desarrollo de un vnculo muy especial entre la madre y el beb.   Es ms conveniente, siempre disponible a la temperatura adecuada y ms econmica que la leche maternizada.   Consume caloras en la madre y la ayuda a perder el peso ganado durante el embarazo.   Favorece la contraccin del tero a su tamao normal, de manera ms rpida y disminuye las hemorragias luego del parto.   Las madres que   amamantan tienen menor riesgo de desarrollar cncer de mama.  AMAMNTELO CON FRECUENCIA  Un beb sano, nacido a trmino, puede amamantarse con tanta frecuencia como cada hora, o espaciar las comidas cada tres horas.   Esta frecuencia variar de un beb a otro. Observe al beb cuando manifieste signos de hambre, antes que regirse por el reloj.    Amamntelo tan seguido como el beb lo solicite, o cuando usted sienta la necesidad de aliviar sus mamas.   Despierte al beb si han pasado 3  4 horas desde la ltima comida.   El amamantamiento frecuente la ayudar a producir ms leche y a prevenir problemas de dolor en los pezones e hinchazn de las mamas.  LA POSICIN DEL BEB PARA AMAMANTARLO  Ya sea que se encuentre acostada o sentada, asegrese que el abdomen del beb enfrente el suyo.   Sostenga la mama con el pulgar por arriba y el resto de los dedos por debajo. Asegrese que sus dedos se encuentren lejos del pezn y de la boca del beb.   Toque suavemente los labios del beb y la mejilla ms cercana a la mama con el dedo o el pezn.   Cuando la boca del beb se abra lo suficiente, introduzca el pezn y la zona oscura que lo rodea tanto como le sea posible dentro de la boca.   Coloque a beb cerca suyo de modo que su nariz y mejillas toquen las mamas al mamar.  LAS COMIDAS  La duracin de cada comida vara de un beb a otro y de una comida a otra.   El beb debe succionar alrededor de dos o tres minutos para que le llegue leche. Esto se denomina "bajada". Por este motivo, permita que el nio se alimente en cada mama todo lo que desee. Terminar de mamar cuando haya recibido la cantidad adecuada de nutrientes.   Para detener la succin coloque su dedo en la comisura de la boca del nio y deslcelo entre sus encas antes de quitarle la mama de la boca. Esto la ayudar a evitar el dolor en los pezones.  REDUCIR LA CONGESTIN DE LAS MAMAS  Durante la primera semana despus del parto, usted puede experimentar congestin en las mamas. Cuando las mamas estn congestionadas, se sienten calientes, llenas y molestas al tacto. Puede reducir la congestin si:   Lo amamanta frecuentemente, cada 2-3 horas. Las mams que amamantan pronto y con frecuencia tienen menos problemas de congestin.   Coloque bolsas fras livianas entre cada  mamada. Esto ayuda a reducir la hinchazn. Envuelva las bolsas de hielo en una toalla liviana para proteger su piel.   Aplique compresas hmedas calientes sobre la mama durante 5 a 10 minutos antes de amamantar al nio. Esto aumenta la circulacin y ayuda a que la leche fluya.   Masajee suavemente la mama antes y durante la alimentacin.   Asegrese que el nio vaca al menos una mama antes de cambiar de lado.   Use un sacaleche para vaciar la mama si el beb se duerme o no se alimenta bien. Tambin podr quitarse la leche con esta bomba si tiene que volver al trabajo o siente que las mamas estn congestionadas.   Evite los biberones, chupetes o complementar la alimentacin con agua o jugos en lugar de la leche materna.   Verifique que el beb se encuentra en la posicin correcta mientras lo alimenta.   Evite el cansancio, el estrs y la anemia   Use un soutien que sostenga bien sus   mamas y evite los que tienen aro.   Consuma una dieta balanceada y beba lquidos en cantidad.  Si sigue estas indicaciones, la congestin debe mejorar en 24 a 48 horas. Si an tiene dificultades, consulte a su asesor en lactancia. TENDR SUFICIENTE LECHE MI BEB? Algunas veces las madres se preocupan acerca de si sus bebs tendrn la leche suficiente. Puede asegurarse que el beb tiene la leche suficiente si:  El beb succiona y escucha que traga activamente.   El nio se alimenta al menos 8 a 12 veces en 24 horas. Alimntelo hasta que se desprenda por sus propios medios o se quede dormido en la primera mama (al menos durante 10 a 20 minutos), luego ofrzcale el otro lado.   El beb moja 5 a 6 paales descartables (6 a 8 paales de tela) en 24 horas cuando tiene 5  6 das de vida.   Tiene al menos 2-3 deposiciones todos los das en los primeros meses. La leche materna es todo el alimento que el beb necesita. No es necesario que el nio ingiera agua o preparados de bibern. De hecho, para ayudar a que sus  mamas produzcan ms leche, lo mejor es no darle al beb suplementos durante las primeras semanas.   La materia fecal debe ser blanda y amarillenta.   El beb debe aumentar 112 a 196 g por semana.  CUDESE Cuide sus mamas del siguiente modo:  Bese o dchese diariamente.   No lave sus pezones con jabn.   Comience a amamantar del lado izquierdo en una comida y del lado derecho en la siguiente.   Notar que aumenta el suministro de leche a los 2 a 5 das despus del parto. Puede sentir algunas molestias por la congestin, lo que hace que sus mamas estn duras y sensibles. La congestin disminuye en 24 a 48 horas. Mientras tanto, aplique toallas hmedas calientes durante 5 a 10 minutos antes de amamantar. Un masaje suave y la extraccin de un poco de leche antes de amamantar ablandarn las mamas y har ms fcil que el beb se agarre. Use un buen sostn y seque al aire los pezones durante 10 a 15 minutos luego de cada alimentacin.   Solo utilice apsitos de algodn.   Utilice lanolina pura sobre los pezones luego de amamantar. No necesita lavarlos luego de alimentar al nio.  Cudese del siguiente modo:   Consuma alimentos bien balanceados y refrigerios nutritivos.   Beba leche, jugos de fruta y agua para satisfacer la sed (alrededor de 8 vasos por da).   Descanse lo suficiente.   Aumente la ingesta de calcio en la dieta (1200mg/da).   Evite los alimentos que usted nota que puedan afectar al beb.  SOLICITE ATENCIN MDICA SI:  Tiene preguntas que formular o dificultades con la alimentacin a pecho.   Necesita ayuda.   Observa una zona dura, roja y que le duele en la zona de la mama, y se acompaa de fiebre de 100.5 F (38.1 C) o ms.   El beb est muy somnoliento como para alimentarse bien o tiene problemas para dormir.   El beb moja menos de 6 paales por da, a partir de los 5 das de vida.   La piel del beb o la parte blanca de sus ojos est ms amarilla de lo que  estaba en el hospital.   Se siente deprimida.  Document Released: 09/14/2005 Document Revised: 09/03/2011 [employer] Patient Information 2012 [employer]. 

## 2012-01-25 NOTE — Progress Notes (Signed)
Pulse 70. Patient reports pelvic pressure and irregular contractions.

## 2012-01-25 NOTE — Progress Notes (Signed)
NST reviewed and reactive. U/S on 4/25 7 lb 5 oz 72%, vtx, AFI 15 FBS 73-93 2 hr pp 72-159

## 2012-01-26 ENCOUNTER — Encounter (HOSPITAL_COMMUNITY): Payer: Self-pay

## 2012-01-26 ENCOUNTER — Inpatient Hospital Stay (HOSPITAL_COMMUNITY)
Admission: RE | Admit: 2012-01-26 | Discharge: 2012-01-30 | DRG: 775 | Disposition: A | Discharge status: Home / Self Care | Payer: Medicaid Other | Source: Ambulatory Visit | Attending: Obstetrics & Gynecology | Admitting: Obstetrics & Gynecology

## 2012-01-26 VITALS — BP 93/68 | HR 79 | Temp 98.0°F | Resp 20 | Ht 63.0 in | Wt 168.0 lb

## 2012-01-26 DIAGNOSIS — O0990 Supervision of high risk pregnancy, unspecified, unspecified trimester: Secondary | ICD-10-CM

## 2012-01-26 DIAGNOSIS — O99814 Abnormal glucose complicating childbirth: Principal | ICD-10-CM | POA: Diagnosis present

## 2012-01-26 DIAGNOSIS — O24419 Gestational diabetes mellitus in pregnancy, unspecified control: Secondary | ICD-10-CM

## 2012-01-26 DIAGNOSIS — O2650 Maternal hypotension syndrome, unspecified trimester: Secondary | ICD-10-CM | POA: Diagnosis not present

## 2012-01-26 LAB — GLUCOSE, RANDOM: Glucose, Bld: 74 mg/dL (ref 70–99)

## 2012-01-26 LAB — CBC
Hemoglobin: 13.4 g/dL (ref 12.0–15.0)
MCH: 35.6 pg — ABNORMAL HIGH (ref 26.0–34.0)
MCV: 102.1 fL — ABNORMAL HIGH (ref 78.0–100.0)
Platelets: 144 10*3/uL — ABNORMAL LOW (ref 150–400)
RBC: 3.76 MIL/uL — ABNORMAL LOW (ref 3.87–5.11)
WBC: 6.7 10*3/uL (ref 4.0–10.5)

## 2012-01-26 MED ORDER — NALBUPHINE SYRINGE 5 MG/0.5 ML: 10.0000 mg | INJECTION | INTRAMUSCULAR | Status: DC | PRN

## 2012-01-26 MED ORDER — LACTATED RINGERS IV SOLN
INTRAVENOUS | Status: DC
  Administered 2012-01-26 – 2012-01-28 (×5): via INTRAVENOUS
  Administered 2012-01-28: 200 mL via INTRAVENOUS

## 2012-01-26 MED ORDER — LIDOCAINE HCL (PF) 1 % IJ SOLN
30.0000 mL | INTRAMUSCULAR | Status: DC | PRN
  Administered 2012-01-28: 30 mL via SUBCUTANEOUS
  Filled 2012-01-26: qty 30

## 2012-01-26 MED ORDER — GLYBURIDE 2.5 MG PO TABS
2.5000 mg | ORAL_TABLET | Freq: Every day | ORAL | Status: DC
  Administered 2012-01-26: 2.5 mg via ORAL
  Filled 2012-01-26 (×2): qty 1

## 2012-01-26 MED ORDER — OXYCODONE-ACETAMINOPHEN 5-325 MG PO TABS: 1.0000 | ORAL_TABLET | ORAL | Status: DC | PRN

## 2012-01-26 MED ORDER — ACETAMINOPHEN 325 MG PO TABS: 650.0000 mg | ORAL_TABLET | ORAL | Status: DC | PRN

## 2012-01-26 MED ORDER — MISOPROSTOL 25 MCG QUARTER TABLET
25.0000 ug | ORAL_TABLET | ORAL | Status: DC | PRN
  Administered 2012-01-26 – 2012-01-27 (×2): 25 ug via VAGINAL
  Filled 2012-01-26 (×2): qty 0.25

## 2012-01-26 MED ORDER — OXYTOCIN 20 UNITS IN LACTATED RINGERS INFUSION - SIMPLE: 125.0000 mL/h | Freq: Once | INTRAVENOUS | Status: DC

## 2012-01-26 MED ORDER — CITRIC ACID-SODIUM CITRATE 334-500 MG/5ML PO SOLN: 30.0000 mL | ORAL | Status: DC | PRN

## 2012-01-26 MED ORDER — ONDANSETRON HCL 4 MG/2ML IJ SOLN: 4.0000 mg | Freq: Four times a day (QID) | INTRAMUSCULAR | Status: DC | PRN

## 2012-01-26 MED ORDER — LACTATED RINGERS IV SOLN: 500.0000 mL | INTRAVENOUS | Status: DC | PRN

## 2012-01-26 MED ORDER — FLEET ENEMA 7-19 GM/118ML RE ENEM: 1.0000 | ENEMA | RECTAL | Status: DC | PRN

## 2012-01-26 MED ORDER — TERBUTALINE SULFATE 1 MG/ML IJ SOLN: 0.2500 mg | Freq: Once | INTRAMUSCULAR | Status: AC | PRN

## 2012-01-26 MED ORDER — IBUPROFEN 600 MG PO TABS: 600.0000 mg | ORAL_TABLET | Freq: Four times a day (QID) | ORAL | Status: DC | PRN

## 2012-01-26 MED ORDER — OXYTOCIN BOLUS FROM INFUSION
500.0000 mL | Freq: Once | INTRAVENOUS | Status: DC
  Filled 2012-01-26: qty 500

## 2012-01-26 NOTE — H&P (Signed)
Lynn Burnett is a 31 y.o. female presenting for IOL for A2GDM. Denies leak or bldg. Reports +FM. Preg has been followed by the [facility] and has been remarkable for dx of A2GDM requiring glyburide 2.5mg  qd. History OB History    Grav Para Term Preterm Abortions TAB SAB Ect Mult Living   2    1  1         Past Medical History  Diagnosis Date  . Diabetes mellitus     prediabetes per MD no meds   History reviewed. No pertinent past surgical history. Family History: family history includes Cancer in her maternal aunt and mother and Miscarriages / [place] in her mother. Social History:  reports that she has never smoked. She has never used smokeless tobacco. She reports that she does not drink alcohol or use illicit drugs.  ROS  Dilation: Closed Effacement (%): 80 Station: -3 Exam by:: N. Deal, RN Blood pressure 122/75, pulse 62, temperature 98 F (36.7 C), temperature source Oral, resp. rate 18, height 5\' 3"  (1.6 m), weight 76.204 kg (168 lb), last menstrual period 04/28/2011. Maternal Exam:  Uterine Assessment: Mild & irreg upon arrival; more regular after cytotec     Fetal Exam Fetal Monitor Review: Baseline rate: 130.  Variability: moderate (6-25 bpm).   Pattern: accelerations present and no decelerations.       Physical Exam  Constitutional: She is oriented to person, place, and time. She appears well-developed and well-nourished.  HENT:  Head: Normocephalic.  Cardiovascular: Normal rate.   Respiratory: Effort normal.  Musculoskeletal: Normal range of motion.  Neurological: She is alert and oriented to person, place, and time.  Skin: Skin is warm and dry.  Psychiatric: She has a normal mood and affect. Her behavior is normal. Thought content normal.    Prenatal labs: ABO, Rh: B/POS/-- (09/19 4098) Antibody: NEG (09/19 0937) Rubella: 0.5 (09/19 0937) RPR: NON REAC (02/13 0927)  HBsAg: NEGATIVE (09/19 1191)  HIV: NON REACTIVE (02/13 0927)  GBS: Negative  (04/11 0000)   Assessment/Plan: IUP at term A2GDM Unfavorable cx  Will give cytotec q 4 hr for ripening, and then possibly foley bulb as cx softens/opens some. Understands the lengthy process with IOL Will continue glyburide at least initially with the induction  Cam Hai 01/26/2012, 11:53 PM

## 2012-01-27 ENCOUNTER — Encounter (HOSPITAL_COMMUNITY): Payer: Self-pay | Admitting: Anesthesiology

## 2012-01-27 ENCOUNTER — Inpatient Hospital Stay (HOSPITAL_COMMUNITY): Payer: Medicaid Other | Admitting: Anesthesiology

## 2012-01-27 LAB — RPR: RPR Ser Ql: NONREACTIVE

## 2012-01-27 MED ORDER — EPHEDRINE 5 MG/ML INJ
10.0000 mg | INTRAVENOUS | Status: DC | PRN
  Filled 2012-01-27: qty 4

## 2012-01-27 MED ORDER — PHENYLEPHRINE 40 MCG/ML (10ML) SYRINGE FOR IV PUSH (FOR BLOOD PRESSURE SUPPORT): 80.0000 ug | PREFILLED_SYRINGE | INTRAVENOUS | Status: DC | PRN

## 2012-01-27 MED ORDER — LIDOCAINE HCL (PF) 1 % IJ SOLN
INTRAMUSCULAR | Status: DC | PRN
  Administered 2012-01-27 (×3): 4 mL

## 2012-01-27 MED ORDER — EPHEDRINE 5 MG/ML INJ: 10.0000 mg | INTRAVENOUS | Status: DC | PRN

## 2012-01-27 MED ORDER — PHENYLEPHRINE 40 MCG/ML (10ML) SYRINGE FOR IV PUSH (FOR BLOOD PRESSURE SUPPORT)
80.0000 ug | PREFILLED_SYRINGE | INTRAVENOUS | Status: DC | PRN
  Filled 2012-01-27: qty 5

## 2012-01-27 MED ORDER — LACTATED RINGERS IV SOLN
500.0000 mL | Freq: Once | INTRAVENOUS | Status: AC
  Administered 2012-01-27: 1000 mL via INTRAVENOUS

## 2012-01-27 MED ORDER — TERBUTALINE SULFATE 1 MG/ML IJ SOLN: 0.2500 mg | Freq: Once | INTRAMUSCULAR | Status: AC | PRN

## 2012-01-27 MED ORDER — FENTANYL 2.5 MCG/ML BUPIVACAINE 1/10 % EPIDURAL INFUSION (WH - ANES)
14.0000 mL/h | INTRAMUSCULAR | Status: DC
  Administered 2012-01-27 – 2012-01-28 (×5): 14 mL/h via EPIDURAL
  Filled 2012-01-27 (×5): qty 60

## 2012-01-27 MED ORDER — DIPHENHYDRAMINE HCL 50 MG/ML IJ SOLN: 12.5000 mg | INTRAMUSCULAR | Status: DC | PRN

## 2012-01-27 MED ORDER — OXYTOCIN 20 UNITS IN LACTATED RINGERS INFUSION - SIMPLE
1.0000 m[IU]/min | INTRAVENOUS | Status: DC
  Administered 2012-01-27: 2 m[IU]/min via INTRAVENOUS
  Filled 2012-01-27: qty 1000

## 2012-01-27 NOTE — Progress Notes (Signed)
Lynn Burnett is a 31 y.o. G2P0010 at [redacted]w[redacted]d   Subjective: Resting; not feeling ctx  Objective: BP 79/66  Pulse 79  Temp(Src) 98 F (36.7 C) (Oral)  Resp 18  Ht 5\' 3"  (1.6 m)  Wt 76.204 kg (168 lb)  BMI 29.76 kg/m2  LMP 04/28/2011      FHT:  FHR: 130 bpm, variability: moderate,  accelerations:  Present,  decelerations:  Absent UC:   irregular, every 2-5 minutes SVE:   Dilation: Fingertip Effacement (%): 70 Station: -3 Exam by:: [employer]: Lab Results  Component Value Date   WBC 6.7 01/26/2012   HGB 13.4 01/26/2012   HCT 38.4 01/26/2012   MCV 102.1* 01/26/2012   PLT 144* 01/26/2012   CBG upon arrival: 74  Assessment / Plan: IUP term A2GDM Unfavorable cx  Placed second dose cytotec Most likely plan a third dose, and then eval for foley   Lynn Burnett 01/27/2012, 3:38 AM

## 2012-01-27 NOTE — Anesthesia Procedure Notes (Signed)
Epidural Patient location during procedure: OB Start time: 01/27/2012 10:56 AM Reason for block: procedure for pain  Staffing Performed by: anesthesiologist   Preanesthetic Checklist Completed: patient identified, site marked, surgical consent, pre-op evaluation, timeout performed, IV checked, risks and benefits discussed and monitors and equipment checked  Epidural Patient position: sitting Prep: site prepped and draped and DuraPrep Patient monitoring: continuous pulse ox and blood pressure Approach: midline Injection technique: LOR air  Needle:  Needle type: Tuohy  Needle gauge: 17 G Needle length: 9 cm Needle insertion depth: 5 cm cm Catheter type: closed end flexible Catheter size: 19 Gauge Catheter at skin depth: 10 cm Test dose: negative  Assessment Events: blood not aspirated, injection not painful, no injection resistance, negative IV test and no paresthesia  Additional Notes Discussed risk of headache, infection, bleeding, nerve injury and failed or incomplete block.  Patient voices understanding and wishes to proceed.

## 2012-01-27 NOTE — Anesthesia Preprocedure Evaluation (Signed)
Anesthesia Evaluation  Patient identified by MRN, date of birth, ID band Patient awake    Reviewed: Allergy & Precautions, H&P , NPO status , Patient's Chart, lab work & pertinent test results, reviewed documented beta blocker date and time   History of Anesthesia Complications Negative for: history of anesthetic complications  Airway Mallampati: I TM Distance: >3 FB Neck ROM: full    Dental  (+) Teeth Intact   Pulmonary neg pulmonary ROS,  breath sounds clear to auscultation        Cardiovascular negative cardio ROS  Rhythm:regular Rate:Normal     Neuro/Psych negative neurological ROS  negative psych ROS   GI/Hepatic negative GI ROS, Neg liver ROS,   Endo/Other  Diabetes mellitus-, Gestational, Oral Hypoglycemic Agents  Renal/GU negative Renal ROS     Musculoskeletal   Abdominal   Peds  Hematology negative hematology ROS (+)   Anesthesia Other Findings   Reproductive/Obstetrics (+) Pregnancy                           Anesthesia Physical Anesthesia Plan  ASA: II  Anesthesia Plan: Epidural   Post-op Pain Management:    Induction:   Airway Management Planned:   Additional Equipment:   Intra-op Plan:   Post-operative Plan:   Informed Consent: I have reviewed the patients History and Physical, chart, labs and discussed the procedure including the risks, benefits and alternatives for the proposed anesthesia with the patient or authorized representative who has indicated his/her understanding and acceptance.     Plan Discussed with:   Anesthesia Plan Comments:         Anesthesia Quick Evaluation

## 2012-01-27 NOTE — Progress Notes (Signed)
Lynn Burnett is a 31 y.o. G2P0010 at [redacted]w[redacted]d by admitted for induction of labor due to Gestational diabetes.  At 8:00 am, cervix was FT/70/-3 and pt was having ctx every 3-5 minutes so third dose of Cytotec was held.    Subjective:   Objective: BP 114/70  Pulse 59  Temp(Src) 98 F (36.7 C) (Oral)  Resp 20  Ht 5\' 3"  (1.6 m)  Wt 76.204 kg (168 lb)  BMI 29.76 kg/m2  LMP 04/28/2011      FHT:  FHR: 135 bpm, variability: moderate,  accelerations:  Present,  decelerations:  Absent UC:   regular, every 5 minutes SVE:   5/90/-1 SROM with clear fluid prior to cervical exam  Labs: Lab Results  Component Value Date   WBC 6.7 01/26/2012   HGB 13.4 01/26/2012   HCT 38.4 01/26/2012   MCV 102.1* 01/26/2012   PLT 144* 01/26/2012    Assessment / Plan: Induction of labor due to gestational diabetes,  progressing well on pitocin  Labor: Progressing normally Preeclampsia:  N/A Fetal Wellbeing:  Category I Pain Control:  Epidural I/D:  n/a Anticipated MOD:  NSVD  [name], Lynn Burnett 01/27/2012, 3:04 PM

## 2012-01-28 ENCOUNTER — Encounter (HOSPITAL_COMMUNITY): Payer: Self-pay

## 2012-01-28 DIAGNOSIS — O2650 Maternal hypotension syndrome, unspecified trimester: Secondary | ICD-10-CM

## 2012-01-28 DIAGNOSIS — O99814 Abnormal glucose complicating childbirth: Secondary | ICD-10-CM

## 2012-01-28 LAB — CBC
HCT: 29.8 % — ABNORMAL LOW (ref 36.0–46.0)
MCHC: 34.2 g/dL (ref 30.0–36.0)
Platelets: 98 10*3/uL — ABNORMAL LOW (ref 150–400)
RDW: 14.2 % (ref 11.5–15.5)

## 2012-01-28 LAB — GLUCOSE, CAPILLARY: Glucose-Capillary: 168 mg/dL — ABNORMAL HIGH (ref 70–99)

## 2012-01-28 MED ORDER — MAGNESIUM HYDROXIDE 400 MG/5ML PO SUSP: 30.0000 mL | ORAL | Status: DC | PRN

## 2012-01-28 MED ORDER — SIMETHICONE 80 MG PO CHEW: 80.0000 mg | CHEWABLE_TABLET | ORAL | Status: DC | PRN

## 2012-01-28 MED ORDER — ONDANSETRON HCL 4 MG/2ML IJ SOLN: 4.0000 mg | INTRAMUSCULAR | Status: DC | PRN

## 2012-01-28 MED ORDER — WITCH HAZEL-GLYCERIN EX PADS: 1.0000 "application " | MEDICATED_PAD | CUTANEOUS | Status: DC | PRN

## 2012-01-28 MED ORDER — TETANUS-DIPHTH-ACELL PERTUSSIS 5-2.5-18.5 LF-MCG/0.5 IM SUSP: 0.5000 mL | Freq: Once | INTRAMUSCULAR | Status: DC

## 2012-01-28 MED ORDER — ZOLPIDEM TARTRATE 5 MG PO TABS: 5.0000 mg | ORAL_TABLET | Freq: Every evening | ORAL | Status: DC | PRN

## 2012-01-28 MED ORDER — DIPHENHYDRAMINE HCL 25 MG PO CAPS: 25.0000 mg | ORAL_CAPSULE | Freq: Four times a day (QID) | ORAL | Status: DC | PRN

## 2012-01-28 MED ORDER — MEASLES, MUMPS & RUBELLA VAC ~~LOC~~ INJ
0.5000 mL | INJECTION | Freq: Once | SUBCUTANEOUS | Status: AC
  Administered 2012-01-30: 0.5 mL via SUBCUTANEOUS
  Filled 2012-01-28 (×2): qty 0.5

## 2012-01-28 MED ORDER — SENNOSIDES-DOCUSATE SODIUM 8.6-50 MG PO TABS
2.0000 | ORAL_TABLET | Freq: Every day | ORAL | Status: DC
  Administered 2012-01-28 – 2012-01-29 (×2): 2 via ORAL

## 2012-01-28 MED ORDER — PRENATAL MULTIVITAMIN CH: 1.0000 | ORAL_TABLET | Freq: Every day | ORAL | Status: DC

## 2012-01-28 MED ORDER — BENZOCAINE-MENTHOL 20-0.5 % EX AERO: 1.0000 "application " | INHALATION_SPRAY | CUTANEOUS | Status: DC | PRN

## 2012-01-28 MED ORDER — PRENATAL MULTIVITAMIN CH
1.0000 | ORAL_TABLET | Freq: Every day | ORAL | Status: DC
  Administered 2012-01-28 – 2012-01-29 (×2): 1 via ORAL
  Filled 2012-01-28 (×2): qty 1

## 2012-01-28 MED ORDER — FERROUS SULFATE 325 (65 FE) MG PO TABS
325.0000 mg | ORAL_TABLET | Freq: Two times a day (BID) | ORAL | Status: DC
  Administered 2012-01-28 – 2012-01-30 (×5): 325 mg via ORAL
  Filled 2012-01-28 (×5): qty 1

## 2012-01-28 MED ORDER — OXYCODONE-ACETAMINOPHEN 5-325 MG PO TABS: 1.0000 | ORAL_TABLET | ORAL | Status: DC | PRN

## 2012-01-28 MED ORDER — BENZOCAINE-MENTHOL 20-0.5 % EX AERO
1.0000 "application " | INHALATION_SPRAY | CUTANEOUS | Status: DC | PRN
  Filled 2012-01-28: qty 56

## 2012-01-28 MED ORDER — OXYCODONE-ACETAMINOPHEN 5-325 MG PO TABS
1.0000 | ORAL_TABLET | ORAL | Status: DC | PRN
  Administered 2012-01-28 – 2012-01-29 (×3): 1 via ORAL
  Administered 2012-01-29 – 2012-01-30 (×2): 2 via ORAL
  Filled 2012-01-28: qty 2
  Filled 2012-01-28 (×3): qty 1
  Filled 2012-01-28: qty 2

## 2012-01-28 MED ORDER — IBUPROFEN 600 MG PO TABS
600.0000 mg | ORAL_TABLET | Freq: Four times a day (QID) | ORAL | Status: DC
Start: 2012-01-28 — End: 2012-01-28
  Filled 2012-01-28: qty 1

## 2012-01-28 MED ORDER — SENNOSIDES-DOCUSATE SODIUM 8.6-50 MG PO TABS
2.0000 | ORAL_TABLET | Freq: Every day | ORAL | Status: DC
Start: 2012-01-28 — End: 2012-01-28

## 2012-01-28 MED ORDER — LANOLIN HYDROUS EX OINT: 1.0000 "application " | TOPICAL_OINTMENT | CUTANEOUS | Status: DC | PRN

## 2012-01-28 MED ORDER — METHYLERGONOVINE MALEATE 0.2 MG/ML IJ SOLN: 0.2000 mg | INTRAMUSCULAR | Status: DC | PRN

## 2012-01-28 MED ORDER — ONDANSETRON HCL 4 MG PO TABS: 4.0000 mg | ORAL_TABLET | ORAL | Status: DC | PRN

## 2012-01-28 MED ORDER — CEFAZOLIN SODIUM 1-5 GM-% IV SOLN
1.0000 g | Freq: Once | INTRAVENOUS | Status: DC
  Filled 2012-01-28: qty 50

## 2012-01-28 MED ORDER — DIBUCAINE 1 % RE OINT: 1.0000 "application " | TOPICAL_OINTMENT | RECTAL | Status: DC | PRN

## 2012-01-28 MED ORDER — LANOLIN HYDROUS EX OINT: TOPICAL_OINTMENT | CUTANEOUS | Status: DC | PRN

## 2012-01-28 MED ORDER — DIBUCAINE 1 % RE OINT
1.0000 "application " | TOPICAL_OINTMENT | RECTAL | Status: DC | PRN
  Filled 2012-01-28: qty 28

## 2012-01-28 MED ORDER — IBUPROFEN 600 MG PO TABS
600.0000 mg | ORAL_TABLET | Freq: Four times a day (QID) | ORAL | Status: DC
  Administered 2012-01-28 – 2012-01-30 (×7): 600 mg via ORAL
  Filled 2012-01-28 (×6): qty 1

## 2012-01-28 MED ORDER — METHYLERGONOVINE MALEATE 0.2 MG PO TABS: 0.2000 mg | ORAL_TABLET | ORAL | Status: DC | PRN

## 2012-01-28 NOTE — Anesthesia Postprocedure Evaluation (Signed)
  Anesthesia Post Note  Patient: Lynn Burnett  Procedure(s) Performed: * No procedures listed *  Anesthesia type: Epidural  Patient location: Mother/Baby  Post pain: Pain level controlled  Post assessment: Post-op Vital signs reviewed  Last Vitals:  Filed Vitals:   01/28/12 1330  BP: 102/67  Pulse: 76  Temp: 36.6 C  Resp: 18    Post vital signs: Reviewed  Level of consciousness:alert  Complications: No apparent anesthesia complications

## 2012-01-28 NOTE — Progress Notes (Signed)
Pt transferred to rm 137 via stretcher

## 2012-01-28 NOTE — Progress Notes (Signed)
Patient ID: Oluwatamilore Starnes, female   DOB: 1981-01-19, 31 y.o.   MRN: [id number] Called to evaluate patient with near syncopal episode s/p vaginal delivery. Patient reports feeling better. Denies chest pain, SOB. Patient reports generalized fatigue  Filed Vitals:   01/28/12 0830  BP: 91/57  Pulse: 80  Temp: 98.8 F (37.1 C)  Resp: 20    GENERAL: Well-developed, well-nourished female in no acute distress.  LUNGS: Clear to auscultation bilaterally.  HEART: Regular rate and rhythm. ABDOMEN: Soft, nontender, nondistended. No organomegaly. Fundus firm, NT PELVIC: Normal lochia EXTREMITIES: No cyanosis, clubbing, or edema, 2+ distal pulses.  A/P 31 yo G2P1011 s/p VAVD with 4 degree laceration and near syncopal episode - Patient hemodynamically stable - H/H stable - Continue close monitoring and routine postpartum care

## 2012-01-28 NOTE — Progress Notes (Signed)
Assisting pt up to the bathroom with the stedy, pt stating that she was dizzy.  Turned back to assist pt to the bed, pt passing out and with RN assistance to the bed in a flat position and amonia inhalant, pt waking.  VSS, HR slightly low in the 50's.  Pt restarted IVF LR bolus.  CBG high.

## 2012-01-28 NOTE — Progress Notes (Signed)
UR Chart review completed.  

## 2012-01-28 NOTE — Progress Notes (Signed)
Lynn Burnett is a 31 y.o. G2P0010 at [redacted]w[redacted]d admitted for induction of labor due to Gestational diabetes.  Subjective: Pt is comfortable with epidural and has been pushing 1.5 hours with some progress noted.  Pt does not feel significant pressure with contractions.    Objective: BP 103/63  Pulse 98  Temp(Src) 98.8 F (37.1 C) (Oral)  Resp 18  Ht 5\' 3"  (1.6 m)  Wt 76.204 kg (168 lb)  BMI 29.76 kg/m2  LMP 04/28/2011   Total I/O In: 49.1 [I.V.:49.1] Out: 825 [Urine:825]  FHT:  FHR: 145 bpm, variability: moderate,  accelerations:  Present,  decelerations:  Present variables with pushing UC:   regular, every 3 minutes   Labs: Lab Results  Component Value Date   WBC 6.7 01/26/2012   HGB 13.4 01/26/2012   HCT 38.4 01/26/2012   MCV 102.1* 01/26/2012   PLT 144* 01/26/2012    Assessment / Plan: Induction of labor due to gestational diabetes Pt pushing ineffective and little urge to push Plan to stop pushing and labor down with pt in lateral sims position to encourage fetal rotation/descent Resume pushing in 30 min to 1 hour   Labor: Progressing normally Preeclampsia:  N/A Fetal Wellbeing:  Category II Pain Control:  Epidural I/D:  n/a Anticipated MOD:  NSVD  [name], Edelmiro Innocent 01/28/2012, 1:06 AM

## 2012-01-29 ENCOUNTER — Encounter (HOSPITAL_COMMUNITY): Payer: Self-pay

## 2012-01-29 LAB — CBC
HCT: 24.3 % — ABNORMAL LOW (ref 36.0–46.0)
Hemoglobin: 8.3 g/dL — ABNORMAL LOW (ref 12.0–15.0)
MCV: 102.5 fL — ABNORMAL HIGH (ref 78.0–100.0)
RBC: 2.37 MIL/uL — ABNORMAL LOW (ref 3.87–5.11)
WBC: 15.3 10*3/uL — ABNORMAL HIGH (ref 4.0–10.5)

## 2012-01-29 LAB — GLUCOSE, CAPILLARY: Glucose-Capillary: 104 mg/dL — ABNORMAL HIGH (ref 70–99)

## 2012-01-29 MED ORDER — POLYETHYLENE GLYCOL 3350 17 G PO PACK
17.0000 g | PACK | Freq: Two times a day (BID) | ORAL | Status: DC | PRN
  Filled 2012-01-29: qty 1

## 2012-01-29 NOTE — Progress Notes (Signed)
Patient seen and examined by me. Agree with above note.  Lynn Burnett 01/29/2012 8:13 AM

## 2012-01-29 NOTE — Progress Notes (Signed)
Patient was taking a shower at 0830, 27 hours post delivery.. She called for help because she started to get light headed while standing up.   Pt returned to her bed with assistance. No fall occurred.  Vital signs all within normal limits.. Patient instructed to call for assistance next time. Husband at the bedside.

## 2012-01-29 NOTE — Progress Notes (Signed)
Post Partum Day 1 Subjective: no complaints, up ad lib, voiding, tolerating PO, + flatus and No BM.  No lightheadedness or syncope.  Objective: Blood pressure 102/58, pulse 71, temperature 97.3 F (36.3 C), temperature source Oral, resp. rate 18, height 5\' 3"  (1.6 m), weight 76.204 kg (168 lb), last menstrual period 04/28/2011, unknown if currently breastfeeding.  Physical Exam:  General: alert, cooperative, appears stated age and no distress Lochia: appropriate Uterine Fundus: firm DVT Evaluation: No evidence of DVT seen on physical exam.   Basename 01/29/12 0545 01/28/12 0840  HGB 8.3* 10.2*  HCT 24.3* 29.8*    Assessment/Plan: PPD1 4th degree laceration and repair after difficult vaginal delivery doing very well Plan for discharge tomorrow, Breastfeeding and Lactation consult VSS. Hgb appropriate - Miralax added to bowel regimen  S LOS: 3 days   Lynn Burnett 01/29/2012, 7:57 AM

## 2012-01-30 MED ORDER — SENNOSIDES-DOCUSATE SODIUM 8.6-50 MG PO TABS: 2.0000 | ORAL_TABLET | Freq: Every day | ORAL | Status: AC

## 2012-01-30 MED ORDER — WITCH HAZEL-GLYCERIN EX PADS: 1.0000 "application " | MEDICATED_PAD | CUTANEOUS | Status: AC | PRN

## 2012-01-30 MED ORDER — OXYCODONE-ACETAMINOPHEN 5-325 MG PO TABS: 1.0000 | ORAL_TABLET | ORAL | Status: AC | PRN

## 2012-01-30 MED ORDER — BENZOCAINE-MENTHOL 20-0.5 % EX AERO: 1.0000 "application " | INHALATION_SPRAY | CUTANEOUS | Status: DC | PRN

## 2012-01-30 MED ORDER — IBUPROFEN 600 MG PO TABS: 600.0000 mg | ORAL_TABLET | Freq: Four times a day (QID) | ORAL | Status: AC

## 2012-01-30 MED ORDER — DIBUCAINE 1 % RE OINT: 1.0000 "application " | TOPICAL_OINTMENT | RECTAL | Status: DC | PRN

## 2012-01-30 NOTE — Discharge Summary (Signed)
Attestation of Attending Supervision of Advanced Practitioner: Evaluation and management procedures were performed by the [facility] Fellow/PA/CNM/NP under my supervision and collaboration. Chart reviewed, and agree with management and plan.  Jaynie Collins, M.D. 01/30/2012 11:01 AM

## 2012-01-30 NOTE — Discharge Summary (Signed)
Obstetric Discharge Summary Reason for Admission: induction of labor Prenatal Procedures: NST and ultrasound Intrapartum Procedures: vacuum Postpartum Procedures: Rubella Ig Complications-Operative and Postpartum: 4th degree perineal laceration Hemoglobin  Date Value Range Status  01/29/2012 8.3* 12.0-15.0 (g/dL) Final     REPEATED TO VERIFY     DELTA CHECK NOTED     HCT  Date Value Range Status  01/29/2012 24.3* 36.0-46.0 (%) Final    Physical Exam:  General: alert, cooperative and appears stated age Lochia: appropriate Uterine Fundus: firm Incision: healing well; 4th degree lac intact DVT Evaluation: No evidence of DVT seen on physical exam. Negative Homan's sign.  Discharge Diagnoses: Term Pregnancy-delivered  Discharge Information: Date: 01/30/2012 Activity: pelvic rest Diet: routine Medications: PNV, Ibuprofen, Colace and Percocet Condition: stable Instructions: refer to practice specific booklet Discharge to: home  ; avoid straining with bowel movements.   Follow-up Information    [facility] 4 weeks. (Call for an appointment as needed)          Newborn Data: Live born female  Birth Weight: 7 lb 7 oz (3374 g) APGAR: 8, 9  Home with mother.  [facility] 01/30/2012, 10:40 AM

## 2012-02-03 ENCOUNTER — Encounter: Payer: Self-pay | Admitting: Advanced Practice Midwife

## 2012-02-26 ENCOUNTER — Ambulatory Visit (INDEPENDENT_AMBULATORY_CARE_PROVIDER_SITE_OTHER): Payer: Self-pay | Admitting: Advanced Practice Midwife

## 2012-02-26 ENCOUNTER — Encounter: Payer: Self-pay | Admitting: Advanced Practice Midwife

## 2012-02-26 DIAGNOSIS — R7303 Prediabetes: Secondary | ICD-10-CM

## 2012-02-26 DIAGNOSIS — R7309 Other abnormal glucose: Secondary | ICD-10-CM

## 2012-02-26 NOTE — Patient Instructions (Signed)
  Place postpartum visit patient instructions here.  

## 2012-02-26 NOTE — Progress Notes (Signed)
  Subjective:     Lynn Burnett is a 31 y.o. female who presents for a postpartum visit. She is 4 weeks postpartum following a vac assisted vaginal delivery. I have fully reviewed the prenatal and intrapartum course. The delivery was at 39.2 gestational weeks. Outcome: vacuum, outlet. Anesthesia: epidural. Postpartum course has been normal except for hemorrhoids. Pt denies pain, bleeding or constipation. Baby's course has been normal. Baby is feeding by breast. Bleeding staining only. Bowel function is normal. Bladder function is normal. Patient is not sexually active. Contraception method is abstinence. Postpartum depression screening: negative.  The following portions of the patient's history were reviewed and updated as appropriate: allergies, current medications, past family history, past medical history, past social history, past surgical history and problem list.  Review of Systems Pertinent items are noted in HPI.   Objective:    BP 117/74  Pulse 70  Temp(Src) 97 F (36.1 C) (Oral)  Ht 5\' 3"  (1.6 m)  Wt 145 lb 4.8 oz (65.908 kg)  BMI 25.74 kg/m2  Breastfeeding? Yes  General:  alert, cooperative and no distress   Breasts:  inspection negative, no nipple discharge or bleeding, no masses or nodularity palpable  Lungs: clear to auscultation bilaterally  Heart:  regular rate and rhythm, S1, S2 normal, no murmur, click, rub or gallop  Abdomen: soft, non-tender; bowel sounds normal; no masses,  no organomegaly   Vulva:  normal and Well-healed 4th degreee lac. Mild tenderness. Good muscle tone.   Vagina: normal vagina and scant tan bleeding  Cervix:  no cervical motion tenderness  Corpus: normal size, contour, position, consistency, mobility, non-tender  Adnexa:  no mass, fullness, tenderness  Rectal Exam: normal sphincter and external hemorrhoid vs skin tag remaining from repair of 4th degree lac. Non-tender. No edema or thrombosis.        Assessment:    Normal postpartum exam.  Pap smear not done at today's visit.   Plan:    1. Contraception: condoms 2. F/U w/ PCP for monitoring of glucose intolerance.  3. Follow up in: 1 year or as needed.

## 2012-03-01 DIAGNOSIS — Z8632 Personal history of gestational diabetes: Secondary | ICD-10-CM

## 2012-03-01 HISTORY — DX: Personal history of gestational diabetes: Z86.32

## 2014-01-20 ENCOUNTER — Encounter (HOSPITAL_COMMUNITY): Payer: Self-pay | Admitting: Emergency Medicine

## 2014-01-20 ENCOUNTER — Emergency Department (HOSPITAL_COMMUNITY): Payer: Self-pay

## 2014-01-20 ENCOUNTER — Emergency Department (HOSPITAL_COMMUNITY)
Admission: EM | Admit: 2014-01-20 | Discharge: 2014-01-20 | Disposition: A | Discharge status: Home / Self Care | Payer: Self-pay | Attending: Emergency Medicine | Admitting: Emergency Medicine

## 2014-01-20 DIAGNOSIS — Z3202 Encounter for pregnancy test, result negative: Secondary | ICD-10-CM | POA: Insufficient documentation

## 2014-01-20 DIAGNOSIS — R110 Nausea: Secondary | ICD-10-CM | POA: Insufficient documentation

## 2014-01-20 DIAGNOSIS — E119 Type 2 diabetes mellitus without complications: Secondary | ICD-10-CM | POA: Insufficient documentation

## 2014-01-20 DIAGNOSIS — R6883 Chills (without fever): Secondary | ICD-10-CM | POA: Insufficient documentation

## 2014-01-20 DIAGNOSIS — R638 Other symptoms and signs concerning food and fluid intake: Secondary | ICD-10-CM | POA: Insufficient documentation

## 2014-01-20 DIAGNOSIS — R42 Dizziness and giddiness: Secondary | ICD-10-CM | POA: Insufficient documentation

## 2014-01-20 DIAGNOSIS — Z79899 Other long term (current) drug therapy: Secondary | ICD-10-CM | POA: Insufficient documentation

## 2014-01-20 DIAGNOSIS — N390 Urinary tract infection, site not specified: Secondary | ICD-10-CM | POA: Insufficient documentation

## 2014-01-20 DIAGNOSIS — R109 Unspecified abdominal pain: Secondary | ICD-10-CM

## 2014-01-20 LAB — COMPREHENSIVE METABOLIC PANEL
ALBUMIN: 3.9 g/dL (ref 3.5–5.2)
ALK PHOS: 74 U/L (ref 39–117)
ALT: 24 U/L (ref 0–35)
AST: 24 U/L (ref 0–37)
BUN: 11 mg/dL (ref 6–23)
CALCIUM: 9.4 mg/dL (ref 8.4–10.5)
CO2: 22 mEq/L (ref 19–32)
CREATININE: 0.61 mg/dL (ref 0.50–1.10)
Chloride: 105 mEq/L (ref 96–112)
GFR calc non Af Amer: >90 mL/min (ref >90)
Glucose, Bld: 107 mg/dL — ABNORMAL HIGH (ref 70–99)
POTASSIUM: 4.1 meq/L (ref 3.7–5.3)
Sodium: 139 mEq/L (ref 137–147)
TOTAL PROTEIN: 7.6 g/dL (ref 6.0–8.3)
Total Bilirubin: 0.4 mg/dL (ref 0.3–1.2)

## 2014-01-20 LAB — CBC WITH DIFFERENTIAL/PLATELET
BASOS PCT: 0 % (ref 0–1)
Basophils Absolute: 0 10*3/uL (ref 0.0–0.1)
EOS ABS: 0.1 10*3/uL (ref 0.0–0.7)
EOS PCT: 1 % (ref 0–5)
HCT: 41.8 % (ref 36.0–46.0)
HEMOGLOBIN: 14.9 g/dL (ref 12.0–15.0)
Lymphocytes Relative: 17 % (ref 12–46)
Lymphs Abs: 1.4 10*3/uL (ref 0.7–4.0)
MCH: 33 pg (ref 26.0–34.0)
MCHC: 35.6 g/dL (ref 30.0–36.0)
MCV: 92.7 fL (ref 78.0–100.0)
MONOS PCT: 7 % (ref 3–12)
Monocytes Absolute: 0.5 10*3/uL (ref 0.1–1.0)
NEUTROS PCT: 75 % (ref 43–77)
Neutro Abs: 6.3 10*3/uL (ref 1.7–7.7)
Platelets: 183 10*3/uL (ref 150–400)
RBC: 4.51 MIL/uL (ref 3.87–5.11)
RDW: 12.9 % (ref 11.5–15.5)
WBC: 8.3 10*3/uL (ref 4.0–10.5)

## 2014-01-20 LAB — URINALYSIS, ROUTINE W REFLEX MICROSCOPIC
Bilirubin Urine: NEGATIVE
Glucose, UA: NEGATIVE mg/dL
Hgb urine dipstick: NEGATIVE
Ketones, ur: NEGATIVE mg/dL
NITRITE: NEGATIVE
PROTEIN: NEGATIVE mg/dL
SPECIFIC GRAVITY, URINE: 1.02 (ref 1.005–1.030)
UROBILINOGEN UA: 0.2 mg/dL (ref 0.0–1.0)
pH: 6.5 (ref 5.0–8.0)

## 2014-01-20 LAB — LIPASE, BLOOD: LIPASE: 36 U/L (ref 11–59)

## 2014-01-20 LAB — POC URINE PREG, ED: PREG TEST UR: NEGATIVE

## 2014-01-20 LAB — URINE MICROSCOPIC-ADD ON

## 2014-01-20 MED ORDER — CEPHALEXIN 250 MG PO CAPS: 250.0000 mg | ORAL_CAPSULE | Freq: Four times a day (QID) | ORAL | Status: DC

## 2014-01-20 MED ORDER — IOHEXOL 300 MG/ML  SOLN
25.0000 mL | INTRAMUSCULAR | Status: DC | PRN
  Administered 2014-01-20: 25 mL via ORAL

## 2014-01-20 MED ORDER — ONDANSETRON HCL 4 MG/2ML IJ SOLN
4.0000 mg | Freq: Once | INTRAMUSCULAR | Status: AC
  Administered 2014-01-20: 4 mg via INTRAVENOUS
  Filled 2014-01-20: qty 2

## 2014-01-20 MED ORDER — ONDANSETRON HCL 4 MG PO TABS: 4.0000 mg | ORAL_TABLET | Freq: Four times a day (QID) | ORAL | Status: DC

## 2014-01-20 MED ORDER — IOHEXOL 300 MG/ML  SOLN
100.0000 mL | Freq: Once | INTRAMUSCULAR | Status: AC | PRN
  Administered 2014-01-20: 100 mL via INTRAVENOUS

## 2014-01-20 MED ORDER — SODIUM CHLORIDE 0.9 % IV BOLUS (SEPSIS)
1000.0000 mL | Freq: Once | INTRAVENOUS | Status: AC
  Administered 2014-01-20: 1000 mL via INTRAVENOUS

## 2014-01-20 NOTE — Discharge Instructions (Signed)
Please call your doctor for a followup appointment within 24-48 hours. When you talk to your doctor please let them know that you were seen in the emergency department and have them acquire all of your records so that they can discuss the findings with you and formulate a treatment plan to fully care for your new and ongoing problems. Please call and set-up an appointment with [facility]. Please call and set-up an appointment with [facility] Please take antibiotics as prescribed for urine infection  Please continue to rest and stay hydrated - please drink plenty of fluids Please avoid foods high in fat and grease. Please stick with a lite diet for the rest of the week.  Please continue to monitor symptoms closely and if symptoms are to worsen or change (fever greater than 101, chills, sweating, nausea, vomiting, diarrhea, worsening or changes to abdominal pain, blood in the stools, black tarry stools, blood in the urine, fainting, dizziness, decreased urination, inability to keep any food or fluid down) please report back to the ED immediately    Dolor abdominal en las mujeres (Abdominal Pain, Women) El dolor abdominal (en el estmago, la pelvis o el vientre) puede tener muchas causas. Es importante que le informe a su mdico:  La ubicacin del Social research officer, government.  Viene y se va, o persiste todo el tiempo?  Hay situaciones que English as a second language teacher (comer ciertos alimentos, la actividad fsica)?  Tiene otros sntomas asociados al dolor (fiebre, nuseas, vmitos, diarrea)? Todo es de gran ayuda cuando se trata de hallar la causa del dolor. CAUSAS  Estmago: Infecciones por virus o bacterias, o lcera.  Intestino: Apendicitis (apndice inflamado), ileitis regional (enfermedad de Crohn), colitis ulcerosa (colon inflamado), sndrome del colon irritable, diverticulitis (inflamacin de los divertculos del colon) o cncer de estmago oo intestino.  Enfermedades de la vescula biliar o  clculos.  Enfermedades renales, clculos o infecciones en el rin.  Infeccin o cncer del pncreas.  Fibromialgia (trastorno doloroso)  Enfermedades de los rganos femeninos:  Uterus: tero: fibroma (tumor no canceroso) o infeccin  Trompas de Falopio: infeccin o embarazo ectpico  En los ovarios, quistes o tumores.  Adherencias plvicas (tejido cicatrizal).  Endometriosis (el tejido que cubre el tero se desarrolla en la pelvis y los rganos plvicos).  Sndrome de Occupational psychologist (los rganos femeninos se llenan de sangre antes del periodo menstrual(  Dolor durante el periodo menstrual.  Dolor durante la ovulacin (al producir vulos).  Dolor al usar el DIU (dispositivo intrauterino para el control de la natalidad)  Programmer, systems los rganos femeninos.  Dolor funcional (no est originado en una enfermedad, puede mejorar sin tratamiento).  Dolor de origen psicolgico  Depresin. DIAGNSTICO Su mdico decidir la gravedad del dolor a travs del examen fsico  Anlisis de sangre  Radiografas  Ecografas  TC (tomografa computada, tipo especial de radiografas).  IMR (resonancia magntica)  Cultivos, en el caso una infeccin  Colon por enema de bario (se inserta una sustancia de contraste en el intestino grueso para mejorar la observacin con rayos X.)  Colonoscopa (observacin del intestino con un tubo luminoso).  Laparoscopa (examen del interior del abdomen con un tubo que tiene [employer]).  Ciruga exploratoria abdominal mayor (se observa el abdomen realizando una gran incisin). TRATAMIENTO El tratamiento depender de la causa del problema.   Muchos de estos casos pueden controlarse y tratarse en casa.  Medicamentos de venta libre indicados por el mdico.  Medicamentos con receta.  Antibiticos, en caso de infeccin  Pldoras anticonceptivas, en el caso de perodos dolorosos o dolor al ovular.  Tratamiento hormonal, para la  endometriosis  Inyecciones para bloqueo nervioso selectivo.  Fisioterapia.  Antidepresivos.  Consejos por parte de un psclogo o psiquiatra.  Ciruga mayor o menor. INSTRUCCIONES PARA EL CUIDADO DOMICILIARIO  No tome ni administre laxantes a menos que se lo haya indicado su mdico.  Tome analgsicos de venta libre slo si se lo ha indicado el profesional que lo asiste. No tome aspirina, ya que puede causar [employer] o hemorragias.  Consuma una dieta lquida (caldo o agua) segn lo indicado por el mdico. Progrese lentamente a una dieta blanda, segn la tolerancia, si el dolor se relaciona con el estmago o el intestino.  Tenga un termmetro y tmese la temperatura varias veces al da.  Haga reposo en la cama y Christiana, si esto Ship broker.  Evite las relaciones sexuales, Higher education careers adviser.  Evite las situaciones estresantes.  Cumpla con las visitas y los anlisis de control, segn las indicaciones de su mdico.  Si el dolor no se [place] con los medicamentos o la [place], [place] tratar con:  Acupuntura.  Ejercicios de relajacin (yoga, meditacin).  Terapia grupal.  Psicoterapia. SOLICITE ATENCIN MDICA SI:  Nota que ciertos Writer de [place].  El tratamiento indicado para Lexicographer no Engineer, civil (consulting).  Necesita analgsicos ms fuertes.  Quiere que le retiren el DIU.  Si se siente confundido o desfalleciente.  Presenta nuseas o vmitos.  Aparece una erupcin cutnea.  Sufre efectos adversos o una reaccin alrgica debido a los medicamentos que toma. SOLICITE ATENCIN MDICA DE INMEDIATO SI:  El dolor persiste o se agrava.  Tiene fiebre.  Siente el dolor slo en algunos sectores del abdomen. Si se localiza en la zona derecha, posiblemente podra tratarse de apendicitis. En un adulto, si se localiza en la regin inferior izquierda del abdomen, podra tratarse de colitis o diverticulitis.  Hay sangre  en las heces (deposiciones de color rojo brillante o negro alquitranado), con o sin vmitos.  Usted presenta sangre en la orina.  Siente escalofros con o sin fiebre.  Se desmaya. ASEGRESE QUE:   Comprende estas instrucciones.  Controlar su enfermedad.  Solicitar ayuda de inmediato si no mejora o si empeora. Document Released: 12/31/2008 Document Revised: 12/07/2011 [facility] Patient Information 2014 [place], [place].   Emergency Department Resource Guide 1) Find a Doctor and Pay Out of Pocket Although you won't have to find out who is covered by your insurance plan, it is a good idea to ask around and get recommendations. You will then need to call the office and see if the doctor you have chosen will accept you as a new patient and what types of options they offer for patients who are self-pay. Some doctors offer discounts or will set up payment plans for their patients who do not have insurance, but you will need to ask so you aren't surprised when you get to your appointment.  2) Contact Your Local Health Department Not all health departments have doctors that can see patients for sick visits, but many do, so it is worth a call to see if yours does. If you don't know where your local health department is, you can check in your phone book. The CDC also has a tool to help you locate your state's health department, and many state websites also have listings of all of their local health departments.  3) Find  a [facility] If your illness is not likely to be very severe or complicated, you may want to try a walk in clinic. These are popping up all over the country in pharmacies, drugstores, and shopping centers. They're usually staffed by nurse practitioners or physician assistants that have been trained to treat common illnesses and complaints. They're usually fairly quick and inexpensive. However, if you have serious medical issues or chronic medical problems, these are probably not  your best option.  No Primary Care Doctor: - Call Health Connect at  [phone] - they can help you locate a primary care doctor that  accepts your insurance, provides certain services, etc. - Physician Referral Service- [phone]  Chronic Pain Problems: Organization         Address  Phone   Notes  [facility]  [phone] Patients need to be referred by their primary care doctor.   Medication Assistance: Organization         Address  Phone   Notes  [facility] Medication [facility] [address] [place], [place] [address] [phone] --Must be a resident of [facility] -- Must have NO insurance coverage whatsoever (no Medicaid/ Medicare, etc.) -- The pt. MUST have a primary care doctor that directs their care regularly and follows them in the community   MedAssist  [phone]   [employer]  [phone]    Agencies that provide inexpensive medical care: Organization         Address  Phone   Notes  [place]  [phone]   [facility]    [phone]   [facility] [place], [place] [address] [phone]   [place] [address], [place] [phone]   Planned Parenthood    [phone]   [facility]    [phone]   [place] and [address], [place] Phone:  [phone], Fax:  [phone] Hours of Operation:  9 am - 6 pm, M-F.  Also accepts Medicaid/Medicare and self-pay.  [facility] for [place], [address], [place] Phone: [phone], Fax: [phone]. Hours of Operation:  8:30 am - 5:30 pm, M-F.  Also accepts Medicaid and self-pay.  [facility] [address], [place] Phone: [phone]   [place], [place], [place] [phone], Ext. [phone] Mondays & Thursdays: 7-9 AM.  First  15 patients are seen on a first come, first serve basis.    [place] Providers:  Organization         Address  Phone   Notes  [facility] [address], [place] [phone] Also accepts self-pay patients.  [facility] [address], [place]  [phone]   [place], [place] [phone]   [facility] [address], [place] [phone]   Lucianne Lei [address], [place]   [phone] Only accepts [place] Access [place] patients after they have their name applied to their card.   Self-Pay (no insurance) in [facility]:  Organization         Address  Phone   Notes  Sickle Cell Patients, [facility] [place]  [place], [place] [phone]   [facility] [place] [phone]   [facility] [place]  [place], [place], [place] [phone]   [facility]/Dr. Osei-Bonsu  [address] [place] or [address], [place] [phone] Phone number for both [place] and [place] locations is the same.  Urgent Medical and [facility] [address], [place] [phone]   [facility] [address] [place] or [address] [phone]   [facility] [address], [place] [phone], phone; [phone], fax Sees patients 1st and 3rd Saturday of every month.  Must not qualify for public or private insurance (i.e. Medicaid, Medicare, [place] Health Choice, Veterans' Benefits)  Household income should be no more than 200% of the poverty level The clinic cannot treat you if you are pregnant or think you are pregnant  Sexually transmitted diseases are not treated at the clinic.    Dental  Care: Organization         Address  Phone  Notes  [facility] Department of [facility] [place] [phone] Accepts children up to age 48 who are enrolled in [place] or [place]; pregnant women with a Medicaid card; and children who have applied for Medicaid or [place] Health Choice, but were declined, whose parents can pay a reduced fee at time of service.  [facility] Department of [facility]  [address], [place] [phone] Accepts children up to age 89 who are enrolled in [place] or [place]; pregnant women with a Medicaid card; and children who have applied for Medicaid or [place] Health Choice, but were declined, whose parents can pay a reduced fee at time of service.  [place] Adult Dental Access PROGRAM  [place] [phone] Patients are seen by appointment only. Walk-ins are not accepted. [place] will see patients 74 years of age and older. Monday - Tuesday (8am-5pm) Most Wednesdays (8:30-5pm) $30 per visit, cash only  [facility] Adult Dental Access PROGRAM  [address], [facility] [phone] Patients are seen by appointment only. Walk-ins are not accepted. [place] will see patients 21 years of age and older. One Wednesday Evening (Monthly: Volunteer Based).  $30 per visit, cash only  [place]  [phone] for adults; Children under age 59, call Graduate Pediatric Dentistry at [phone]. Children aged 49-14, please call [phone] to request a pediatric application.  Dental services are provided in all areas of dental care including fillings, crowns and bridges, complete and partial dentures, implants, gum treatment, root canals, and extractions. Preventive care is also provided. Treatment is provided to both adults and children. Patients are selected via a lottery and there is often a waiting list.   [facility] [address], [place]  [phone] [email or web address]   [facility] [address] [place], [place] [phone], Ext. [phone] Second and Fourth Thursday of each month, opens at 6:30 AM; Clinic ends at 9 AM.  Patients are seen on a first-come first-served basis, and a limited number are seen during each clinic.   [facility]  [address] Hillard Danker [place], [place] [phone]   Eligibility Requirements You must have lived in [place], [place], or  [place] counties for at least the last three months.   You cannot be eligible for state or federal sponsored [employer], including [employer], [place], or [employer].   You generally cannot be eligible for healthcare insurance through your employer.    How to apply: Eligibility screenings are held every Tuesday and Wednesday afternoon from 1:00 pm until 4:00 pm. You do not need an appointment for the interview!  [facility] [address] [place], [place]   [place]  Doctor Phillips Department  [place]  [phone]    Behavioral Health Resources in the Community: Intensive Outpatient Programs Organization         Address  Phone  Notes  [place]. [address], [place], [place] [phone]   [facility] [address], [place], [place]   ADS: Alcohol & Drug Svcs [address], [place], [place]   [place] [address],  [place], [place] or [phone]   Substance Abuse Resources Organization         Address  Phone  Notes  Alcohol and Drug Services  [phone]   [place]  [phone]   The [place]   Chinita Pester  [phone]   Residential & Outpatient Substance Abuse Program  [phone]    Psychological Services Organization         Address  Phone  Notes  [facility] [place]  [place]  [phone]   [place] [address] [place] or [phone]    Mobile Crisis Teams Organization         Address  Phone  Notes  [facility], Mobile Crisis Care Unit  [phone]   Assertive Psychotherapeutic Services  [address] [place], [place]   [place] Levels [address] [place] [phone]    Self-Help/Support Groups Organization         Address  Phone             Notes  [place]. of [place] - variety of support groups  [place] Call for more information  Narcotics Anonymous (NA), [facility] [address], [employer] [place]  2 meetings at this location   Special educational needs teacher         Address  Phone  Notes  [facility] Residential Treatment [place],    [place]  [phone]   [facility]  [address], [place] [id number], [place], [place]   [place], [place] [phone] Admissions: 8am-3pm M-F  Incentives Substance [place] [address],    [place], [place] [phone]   [facility] [address] [place], [place], [place]   [facility] [address],  [place], [place]   [facility] Programs - Intensive Outpatient [address], [place], [place]   [facility] ([place].) [place].,  [place], [place] or [phone]   [facility] Services (RTS) [address], [place], [place] Accepts Medicaid  [facility] [place] [address]  [place] [phone] Substance Abuse/Addiction Treatment   [facility] Resources Organization         Address  Phone  Notes  [facility]  [phone]   Domenic Schwab, PhD [address] [place], [place]   [phone] or [phone]   [place] [place], [place] [phone]   [address], [place], [place] [phone] Insurance/Medicaid/sponsorship through [facility] and Families [address] [place]                                    [place], [place] [phone] [place] [address], [place] [phone]    Dr. Adele Schilder  [phone]   [facility] Dept. 1) [address], [place] 2) [place] 3)  [place] [address], [place] [phone]  [phone]   [place] [phone] or [phone] (After Hours)

## 2014-01-20 NOTE — ED Provider Notes (Signed)
CSN: [id number]     Arrival date & time 01/20/14  1236 History   First MD Initiated Contact with Patient 01/20/14 1258     Chief Complaint  Patient presents with  . Abdominal Pain     (Consider location/radiation/quality/duration/timing/severity/associated sxs/prior Treatment) The history is provided by the patient. No language interpreter was used.  Lynn Burnett is a 33 y/o F with PMHx of DM presenting to the ED with abdominal pain and nausea that started this morning at approximately 10:30 AM. Patient reported that most of her discomfort is localized to the center of her abdomen, mainly her umbilical region described as a constant tightening, pulling sensation. Reported that she has been feeling nauseous. Reported chills. Stated that she has used nothing for the pain. Reported that she has been feeling light-headed - reported decrease in food and fluid intake - reported drinking some water. Denied chest pain, shortness of breath, difficulty breathing, diarrhea, fevers, dizziness, numbness, tingling, urinary symptoms, melena, hematochezia, changes to diet, sick contacts, vaginal complaints, pelvic pain. PCP none  Past Medical History  Diagnosis Date  . Diabetes mellitus     prediabetes per MD no meds   History reviewed. No pertinent past surgical history. Family History  Problem Relation Age of Onset  . Cancer Mother     cervical  . Miscarriages / Stillbirths Mother   . Cancer Maternal Aunt    History  Substance Use Topics  . Smoking status: Never Smoker   . Smokeless tobacco: Never Used  . Alcohol Use: No   OB History   Grav Para Term Preterm Abortions TAB SAB Ect Mult Living   2 1 1  0 1 0 1 0 0 1     Review of Systems  Constitutional: Positive for chills. Negative for fever.  Respiratory: Negative for chest tightness and shortness of breath.   Cardiovascular: Negative for chest pain.  Gastrointestinal: Positive for nausea and abdominal pain. Negative for vomiting,  diarrhea, constipation, blood in stool and anal bleeding.  Genitourinary: Negative for dysuria, vaginal bleeding, vaginal discharge, vaginal pain and pelvic pain.  Musculoskeletal: Positive for back pain. Negative for neck pain.  Neurological: Positive for light-headedness. Negative for dizziness and weakness.  All other systems reviewed and are negative.     Allergies  Review of patient's allergies indicates no known allergies.  Home Medications   Prior to Admission medications   Medication Sig Start Date End Date Taking? Authorizing Provider  benzocaine-Menthol (DERMOPLAST) 20-0.5 % AERO Apply 1 application topically as needed (perineal discomfort). 01/30/12   Joelyn Oms, CNM  dibucaine (NUPERCAINAL) 1 % OINT Place 1 application rectally as needed. 01/30/12   Joelyn Oms, CNM  loratadine (CLARITIN) 10 MG tablet Take 10 mg by mouth daily.    Historical Provider, MD  Prenatal Vit-Fe Fumarate-FA (PRENATAL MULTIVITAMIN) TABS Take 1 tablet by mouth daily.    Historical Provider, MD   BP 91/50  Pulse 68  Temp(Src) 97.8 F (36.6 C) (Oral)  Resp 20  SpO2 98%  LMP 12/28/2013 Physical Exam  Nursing note and vitals reviewed. Constitutional: She is oriented to person, place, and time. She appears well-developed and well-nourished. No distress.  HENT:  Head: Normocephalic and atraumatic.  Mouth/Throat: Oropharynx is clear and moist. No oropharyngeal exudate.  Eyes: Conjunctivae and EOM are normal. Pupils are equal, round, and reactive to light. Right eye exhibits no discharge. Left eye exhibits no discharge.  Neck: Normal range of motion. Neck supple. No tracheal deviation present.  Cardiovascular: Normal  rate, regular rhythm and normal heart sounds.  Exam reveals no friction rub.   No murmur heard. Pulses:      Radial pulses are 2+ on the right side, and 2+ on the left side.       Dorsalis pedis pulses are 2+ on the right side, and 2+ on the left side.  Pulmonary/Chest:  Effort normal and breath sounds normal. No respiratory distress. She has no wheezes. She has no rales.  Abdominal: Soft. Normal appearance and bowel sounds are normal. There is tenderness. There is no rebound, no guarding, no tenderness at McBurney's point and negative Murphy's sign.    Negative abdominal distension noted Soft upon palpation  Discomfort upon palpation to the umbilical region - negative rigidity noted Negative peritoneal signs  Musculoskeletal: Normal range of motion. She exhibits no tenderness.  Lymphadenopathy:    She has no cervical adenopathy.  Neurological: She is alert and oriented to person, place, and time. No cranial nerve deficit. She exhibits normal muscle tone. Coordination normal.  Skin: Skin is warm and dry. No rash noted. She is not diaphoretic. No erythema.  Psychiatric: She has a normal mood and affect. Her behavior is normal. Thought content normal.    ED Course  Procedures (including critical care time)  Results for orders placed during the hospital encounter of 01/20/14  CBC WITH DIFFERENTIAL      Result Value Ref Range   WBC 8.3  4.0 - 10.5 K/uL   RBC 4.51  3.87 - 5.11 MIL/uL   Hemoglobin 14.9  12.0 - 15.0 g/dL   HCT 41.8  36.0 - 46.0 %   MCV 92.7  78.0 - 100.0 fL   MCH 33.0  26.0 - 34.0 pg   MCHC 35.6  30.0 - 36.0 g/dL   RDW 12.9  11.5 - 15.5 %   Platelets 183  150 - 400 K/uL   Neutrophils Relative % 75  43 - 77 %   Neutro Abs 6.3  1.7 - 7.7 K/uL   Lymphocytes Relative 17  12 - 46 %   Lymphs Abs 1.4  0.7 - 4.0 K/uL   Monocytes Relative 7  3 - 12 %   Monocytes Absolute 0.5  0.1 - 1.0 K/uL   Eosinophils Relative 1  0 - 5 %   Eosinophils Absolute 0.1  0.0 - 0.7 K/uL   Basophils Relative 0  0 - 1 %   Basophils Absolute 0.0  0.0 - 0.1 K/uL  COMPREHENSIVE METABOLIC PANEL      Result Value Ref Range   Sodium 139  137 - 147 mEq/L   Potassium 4.1  3.7 - 5.3 mEq/L   Chloride 105  96 - 112 mEq/L   CO2 22  19 - 32 mEq/L   Glucose, Bld 107 (*)  70 - 99 mg/dL   BUN 11  6 - 23 mg/dL   Creatinine, Ser 0.61  0.50 - 1.10 mg/dL   Calcium 9.4  8.4 - 10.5 mg/dL   Total Protein 7.6  6.0 - 8.3 g/dL   Albumin 3.9  3.5 - 5.2 g/dL   AST 24  0 - 37 U/L   ALT 24  0 - 35 U/L   Alkaline Phosphatase 74  39 - 117 U/L   Total Bilirubin 0.4  0.3 - 1.2 mg/dL   GFR calc non Af Amer >90  >90 mL/min   GFR calc Af Amer >90  >90 mL/min  LIPASE, BLOOD      Result Value  Ref Range   Lipase 36  11 - 59 U/L  URINALYSIS, ROUTINE W REFLEX MICROSCOPIC      Result Value Ref Range   Color, Urine YELLOW  YELLOW   APPearance CLOUDY (*) CLEAR   Specific Gravity, Urine 1.020  1.005 - 1.030   pH 6.5  5.0 - 8.0   Glucose, UA NEGATIVE  NEGATIVE mg/dL   Hgb urine dipstick NEGATIVE  NEGATIVE   Bilirubin Urine NEGATIVE  NEGATIVE   Ketones, ur NEGATIVE  NEGATIVE mg/dL   Protein, ur NEGATIVE  NEGATIVE mg/dL   Urobilinogen, UA 0.2  0.0 - 1.0 mg/dL   Nitrite NEGATIVE  NEGATIVE   Leukocytes, UA MODERATE (*) NEGATIVE  URINE MICROSCOPIC-ADD ON      Result Value Ref Range   Squamous Epithelial / LPF FEW (*) RARE   WBC, UA 3-6  <3 WBC/hpf   RBC / HPF 0-2  <3 RBC/hpf   Bacteria, UA FEW (*) RARE  POC URINE PREG, ED      Result Value Ref Range   Preg Test, Ur NEGATIVE  NEGATIVE    Labs Review Labs Reviewed  COMPREHENSIVE METABOLIC PANEL - Abnormal; Notable for the following:    Glucose, Bld 107 (*)    All other components within normal limits  URINALYSIS, ROUTINE W REFLEX MICROSCOPIC - Abnormal; Notable for the following:    APPearance CLOUDY (*)    Leukocytes, UA MODERATE (*)    All other components within normal limits  URINE MICROSCOPIC-ADD ON - Abnormal; Notable for the following:    Squamous Epithelial / LPF FEW (*)    Bacteria, UA FEW (*)    All other components within normal limits  URINE CULTURE  CBC WITH DIFFERENTIAL  LIPASE, BLOOD  POC URINE PREG, ED    Imaging Review Ct Abdomen Pelvis W Contrast  01/20/2014   CLINICAL DATA:  Generalized  abdominal pain, nausea, and chills.  EXAM: CT ABDOMEN AND PELVIS WITH CONTRAST  TECHNIQUE: Multidetector CT imaging of the abdomen and pelvis was performed using the standard protocol following bolus administration of intravenous contrast.  CONTRAST:  129mL OMNIPAQUE IOHEXOL 300 MG/ML  SOLN  COMPARISON:  None.  FINDINGS: Minimal dependent atelectasis is present in the lung bases. There is no pleural effusion.  The liver is mildly low in attenuation diffusely, suggestive of steatosis. The gallbladder, spleen, adrenal glands, kidneys, and pancreas have an unremarkable enhanced appearance.  Oral contrast is present in multiple loops of nondilated small bowel. The appendix is identified in the mid right abdomen and is filled with gas and unremarkable. The colon is nondilated. No gross bowel wall thickening is seen.  There is prominent distention of the bladder. Uterus and ovaries are identified. There is trace free fluid in pelvis. Prominent peripherally enhancing structure in the right adnexa may represent a corpus luteum. No enlarged lymph nodes are identified. No acute osseous abnormality is identified.  IMPRESSION: 1. Trace free fluid in the pelvis and likely right ovarian corpus luteum. 2. Mild hepatic steatosis. 3. Prominent distention of the bladder.   Electronically Signed   By: Logan Bores   On: 01/20/2014 16:23     EKG Interpretation None      MDM   Final diagnoses:  Abdominal pain  UTI (urinary tract infection)   Medications  iohexol (OMNIPAQUE) 300 MG/ML solution 25 mL (25 mLs Oral Contrast Given 01/20/14 1507)  sodium chloride 0.9 % bolus 1,000 mL (0 mLs Intravenous Stopped 01/20/14 1415)  ondansetron (ZOFRAN) injection 4 mg (  4 mg Intravenous Given 01/20/14 1357)  iohexol (OMNIPAQUE) 300 MG/ML solution 100 mL (100 mLs Intravenous Contrast Given 01/20/14 1547)   Filed Vitals:   01/20/14 1243 01/20/14 1710  BP: 128/78 91/50  Pulse: 74 68  Temp: 97.8 F (36.6 C)   TempSrc: Oral   Resp:  16 20  SpO2: 98% 98%    Patient presenting to the ED with abdominal pain that started approximately 10:30 AM this morning localized to the umbilical region described as a sharp sensation with radiation towards the back-associated symptoms of nausea and chills.  CBC negative elevated white blood cell count identified-negative left shift or leukocytosis. CMP negative findings-kidney and liver functioning well. Glucose 107. Anion gap of 12.0 mEq per liter. Lipase negative elevation. Urine pregnancy negative. Urinalysis noted moderate leukocytes with an elevated white blood cell count 3-6, few bacteria identified. Negative nitrites identified. CT abdomen and pelvis with contrast noted trace of free fluid in the right ovarian corpus luteum. Mild hepatic steatosis. Prominent distension of the bladder noted. Urine culture pending.  Doubt DKA. Doubt ectopic pregnancy. Doubt ovarian torsion. Doubt abscess. Doubt acute nflammatory process of the abdomen. Doubt pancreatitis. Doubt appendicitis. Doubt pyelonephritis. Patient presenting with UTI will treat patient. Negative peritoneal signs - nonsurgical abdomen noted, soft. Definitive etiology of the abdominal pain unknown - possible discomfort from ruptured right ovarian corpus luteum. Patient stable, afebrile. Patient able to tolerate fluids PO without difficulty - negative episodes of emesis while in the ED setting. Patient in no acute distress. Discharged patient with antibiotics for UTI. Discussed with patient to rest and stay hydrated. Discussed with patient diet. Referred patient to [facility]. Discussed with patient to closely monitor symptoms and if symptoms are to worsen or change to report back to the ED - strict return instructions given.  Patient agreed to plan of care, understood, all questions answered.   Jamse Mead, PA-C 01/20/14 1747  Jamse Mead, PA-C 01/20/14 1757

## 2014-01-20 NOTE — ED Notes (Signed)
Pt presents with generalized abd pain, nausea, and chills x1.5 hours

## 2014-01-21 NOTE — ED Provider Notes (Signed)
Medical screening examination/treatment/procedure(s) were performed by non-physician practitioner and as supervising physician I was immediately available for consultation/collaboration.    Dot Lanes, MD 01/21/14 [phone]

## 2014-01-22 LAB — URINE CULTURE: Colony Count: >=100000

## 2014-05-15 ENCOUNTER — Encounter: Payer: Self-pay | Admitting: Advanced Practice Midwife

## 2014-05-15 ENCOUNTER — Ambulatory Visit (INDEPENDENT_AMBULATORY_CARE_PROVIDER_SITE_OTHER): Payer: Self-pay | Admitting: Advanced Practice Midwife

## 2014-05-15 VITALS — BP 115/63 | HR 67 | Temp 98.2°F | Wt 157.2 lb

## 2014-05-15 DIAGNOSIS — O09293 Supervision of pregnancy with other poor reproductive or obstetric history, third trimester: Secondary | ICD-10-CM

## 2014-05-15 DIAGNOSIS — Z3491 Encounter for supervision of normal pregnancy, unspecified, first trimester: Secondary | ICD-10-CM

## 2014-05-15 DIAGNOSIS — R7309 Other abnormal glucose: Secondary | ICD-10-CM

## 2014-05-15 DIAGNOSIS — Z8632 Personal history of gestational diabetes: Principal | ICD-10-CM

## 2014-05-15 DIAGNOSIS — O09291 Supervision of pregnancy with other poor reproductive or obstetric history, first trimester: Secondary | ICD-10-CM

## 2014-05-15 DIAGNOSIS — R7303 Prediabetes: Secondary | ICD-10-CM

## 2014-05-15 DIAGNOSIS — O09299 Supervision of pregnancy with other poor reproductive or obstetric history, unspecified trimester: Secondary | ICD-10-CM

## 2014-05-15 DIAGNOSIS — Z3480 Encounter for supervision of other normal pregnancy, unspecified trimester: Secondary | ICD-10-CM

## 2014-05-15 LAB — POCT URINALYSIS DIP (DEVICE)
BILIRUBIN URINE: NEGATIVE
GLUCOSE, UA: NEGATIVE mg/dL
KETONES UR: NEGATIVE mg/dL
LEUKOCYTES UA: NEGATIVE
NITRITE: NEGATIVE
Protein, ur: NEGATIVE mg/dL
Specific Gravity, Urine: 1.02 (ref 1.005–1.030)
Urobilinogen, UA: 0.2 mg/dL (ref 0.0–1.0)
pH: 6 (ref 5.0–8.0)

## 2014-05-15 LAB — US OB COMP LESS 14 WKS

## 2014-05-15 NOTE — Patient Instructions (Signed)
Primer trimestre de embarazo (First Trimester of Pregnancy) El primer trimestre de embarazo se extiende desde la semana1 hasta el final de la semana12 (mes1 al mes3). Una semana despus de que un espermatozoide fecunda un vulo, este se implantar en la pared uterina. Este embrin comenzar a desarrollarse hasta convertirse en un beb. Sus genes y los de su pareja forman el beb. Los genes del varn determinan si ser un nio o una nia. Entre la semana6 y la8, se forman los ojos y el rostro, y los latidos del corazn pueden verse en la ecografa. Al final de las 12semanas, todos los rganos del beb estn formados.  Ahora que est embarazada, querr hacer todo lo que est a su alcance para tener un beb sano. Dos de las cosas ms importantes son tener una buena atencin prenatal y seguir las indicaciones del mdico. La atencin prenatal incluye toda la asistencia mdica que usted recibe antes del nacimiento del beb. Esta ayudar a prevenir, detectar y tratar cualquier problema durante el embarazo y el parto. CAMBIOS EN EL ORGANISMO Su organismo atraviesa por muchos cambios durante el embarazo, y estos varan de una mujer a otra.   Al principio, puede aumentar o bajar algunos kilos.  Puede tener malestar estomacal (nuseas) y vomitar. Si no puede controlar los vmitos, llame al mdico.  Puede cansarse con facilidad.  Es posible que tenga dolores de cabeza que pueden aliviarse con los medicamentos que el mdico le permita tomar.  Puede orinar con mayor frecuencia. El dolor al orinar puede significar que usted tiene una infeccin de la vejiga.  Debido al embarazo, puede tener acidez estomacal.  Puede estar estreida, ya que ciertas hormonas enlentecen los movimientos de los msculos que empujan los desechos a travs de los intestinos.  Pueden aparecer hemorroides o abultarse e hincharse las venas (venas varicosas).  Las mamas pueden empezar a agrandarse y estar sensibles. Los pezones  pueden sobresalir ms, y el tejido que los rodea (areola) tornarse ms oscuro.  Las encas pueden sangrar y estar sensibles al cepillado y al hilo dental.  Pueden aparecer zonas oscuras o manchas (cloasma, mscara del embarazo) en el rostro que probablemente se atenuarn despus del nacimiento del beb.  Los perodos menstruales se interrumpirn.  Tal vez no tenga apetito.  Puede sentir un fuerte deseo de consumir ciertos alimentos.  Puede tener cambios a nivel emocional da a da, por ejemplo, por momentos puede estar emocionada por el embarazo y por otros preocuparse porque algo pueda salir mal con el embarazo o el beb.  Tendr sueos ms vvidos y extraos.  Tal vez haya cambios en el cabello que pueden incluir su engrosamiento, crecimiento rpido y cambios en la textura. A algunas mujeres tambin se les cae el cabello durante o despus del embarazo, o tienen el cabello seco o fino. Lo ms probable es que el cabello se le normalice despus del nacimiento del beb. QU DEBE ESPERAR EN LAS CONSULTAS PRENATALES Durante una visita prenatal de rutina:  La pesarn para asegurarse de que usted y el beb estn creciendo normalmente.  Le controlarn la presin arterial.  Le medirn el abdomen para controlar el desarrollo del beb.  Se escucharn los latidos cardacos a partir de la semana10 o la12 de embarazo, aproximadamente.  Se analizarn los resultados de los estudios solicitados en visitas anteriores. El mdico puede preguntarle:  Cmo se siente.  Si siente los movimientos del beb.  Si ha tenido sntomas anormales, como prdida de lquido, sangrado, dolores de cabeza intensos o   clicos abdominales.  Si tiene alguna pregunta. Otros estudios que pueden realizarse durante el primer trimestre incluyen lo siguiente:  Anlisis de sangre para determinar el tipo de sangre y detectar la presencia de infecciones previas. Adems, se los usar para controlar si los niveles de hierro  son bajos (anemia) y determinar los anticuerpos Rh. En una etapa ms avanzada del embarazo, se harn anlisis de sangre para saber si tiene diabetes, junto con otros estudios si surgen problemas.  Anlisis de orina para detectar infecciones, diabetes o protenas en la orina.  Una ecografa para confirmar que el beb crece y se desarrolla correctamente.  Una amniocentesis para diagnosticar posibles problemas genticos.  Estudios del feto para descartar espina bfida y sndrome de [place].  Es posible que necesite otras pruebas adicionales. INSTRUCCIONES PARA EL CUIDADO EN EL HOGAR  Medicamentos:  Siga las indicaciones del mdico en relacin con el uso de medicamentos. Durante el embarazo, hay medicamentos que pueden tomarse y otros que no.  Tome las vitaminas prenatales como se le indic.  Si est estreida, tome un laxante suave, si el mdico lo autoriza. Dieta  Consuma alimentos balanceados. Elija alimentos variados, como carne o protenas de origen vegetal, pescado, leche y productos lcteos descremados, verduras, frutas y panes y cereales integrales. El mdico la ayudar a determinar la cantidad de peso que puede aumentar.  No coma carne cruda ni quesos sin cocinar. Estos elementos contienen bacterias que pueden causar defectos congnitos en el beb.  La ingesta diaria de cuatro o cinco comidas pequeas en lugar de tres comidas abundantes puede ayudar a aliviar las nuseas y los vmitos. Si empieza a tener nuseas, comer algunas galletas saladas puede ser de ayuda. Beber lquidos entre las comidas en lugar de tomarlos durante las comidas tambin puede ayudar a calmar las nuseas y los vmitos.  Si est estreida, consuma alimentos con alto contenido de fibra, como verduras y frutas frescas, y cereales integrales. Beba suficiente lquido para mantener la orina clara o de color amarillo plido. Actividad y ejercicios  Haga ejercicio solamente como se lo haya indicado el mdico. El  ejercicio la ayudar a:  Controlar el peso.  Mantenerse en forma.  Estar preparada para el trabajo de parto y el parto.  Los dolores, los clicos en la parte baja del abdomen o los calambres en la cintura son un buen indicio de que debe dejar de hacer ejercicios. Consulte al mdico antes de seguir haciendo ejercicios normales.  Intente no estar de pie durante mucho tiempo. Mueva las piernas con frecuencia si debe estar de pie en un lugar durante mucho tiempo.  Evite levantar pesos excesivos.  Use zapatos de tacones bajos y mantenga una buena postura.  Puede seguir teniendo relaciones sexuales, excepto que el mdico le indique lo contrario. Alivio del dolor o las molestias  Use un sostn que le brinde buen soporte si siente dolor a la palpacin en las mamas.  Dese baos de asiento con agua tibia para aliviar el dolor o las molestias causadas por las hemorroides. Use crema antihemorroidal si el mdico se lo permite.  Descanse con las piernas elevadas si tiene calambres o dolor de cintura.  Si tiene venas varicosas en las piernas, use medias de descanso. Eleve los pies durante 15minutos, 3 o 4veces por da. Limite la cantidad de sal en su dieta. Cuidados prenatales  Programe las visitas prenatales para la semana12 de embarazo. Generalmente se programan cada mes al principio y se hacen ms frecuentes en los 2 ltimos meses antes   del parto.  Escriba sus preguntas. Llvelas cuando concurra a las visitas prenatales.  Concurra a todas las visitas prenatales como se lo haya indicado el mdico. Seguridad  Colquese el cinturn de seguridad cuando conduzca.  Haga una lista de los nmeros de telfono de Freight forwarder, que BJ's nmeros de telfono de familiares, Fidelis, el hospital y los departamentos de polica y bomberos. Consejos generales  Pdale al mdico que la derive a clases de educacin prenatal en su localidad. Debe comenzar a tomar las clases antes de Dietitian en el mes6  de embarazo.  Pida ayuda si tiene necesidades nutricionales o de asesoramiento Solicitor. El mdico puede aconsejarla o derivarla a especialistas para que la ayuden con diferentes necesidades.  No se d baos de inmersin en agua caliente, baos turcos ni saunas.  No se haga duchas vaginales ni use tampones o toallas higinicas perfumadas.  No mantenga las piernas cruzadas durante mucho tiempo.  Evite el contacto con las bandejas sanitarias de los gatos y la tierra que estos animales usan. Estos elementos contienen bacterias que pueden causar defectos congnitos al beb y la posible prdida del feto debido a un aborto espontneo o muerte fetal.  No fume, no consuma hierbas ni medicamentos que no hayan sido recetados por el mdico. Las sustancias qumicas que estos productos contienen afectan la formacin y el desarrollo del beb.  Programe una cita con el dentista. En su casa, lvese los dientes con un cepillo dental blando y psese el hilo dental con suavidad. SOLICITE ATENCIN MDICA SI:   Tiene mareos.  Siente clicos leves, presin en la pelvis o dolor persistente en el abdomen.  Tiene nuseas, vmitos o diarrea persistentes.  Tiene secrecin vaginal con mal olor.  Siente dolor al [employer].  Tiene el rostro, las [place], las piernas o los tobillos ms hinchados. SOLICITE ATENCIN MDICA DE INMEDIATO SI:   Tiene fiebre.  Tiene una prdida de lquido por la vagina.  Tiene sangrado o pequeas prdidas vaginales.  Siente dolor intenso o clicos en el abdomen.  Sube o baja de peso rpidamente.  Vomita sangre de color rojo brillante o material que parezca granos de caf.  Ha estado expuesta a la rubola y no ha sufrido la enfermedad.  Ha estado expuesta a la quinta enfermedad o a la varicela.  Tiene un dolor de cabeza intenso.  Le falta el aire.  Sufre cualquier tipo de traumatismo, por ejemplo, debido a una cada o un accidente automovilstico. Document  Released: 06/24/2005 Document Revised: 01/29/2014 [facility] Patient Information 2015 [place], [place]. This information is not intended to replace advice given to you by your health care provider. Make sure you discuss any questions you have with your health care provider.  Lactancia materna (Breastfeeding) Decidir Economist es una de las mejores elecciones que puede hacer por usted y su beb. El cambio hormonal durante el Media planner produce el desarrollo del tejido mamario y [place] la cantidad y el tamao de los conductos galactforos. Estas hormonas tambin permiten que las protenas, los azcares y las grasas de la sangre produzcan la Northeast Utilities materna en las glndulas productoras de [place]. Las hormonas impiden que la leche materna sea liberada antes del nacimiento del beb, adems de impulsar el flujo de leche luego del nacimiento. Una vez que ha comenzado a Economist, Freight forwarder beb, as Therapist, occupational succin o Social research officer, government, pueden estimular la liberacin de [place] de las glndulas productoras de [place].  LOS BENEFICIOS DE AMAMANTAR Para el beb  La primera leche (calostro)  ayuda a mejorar el funcionamiento del sistema digestivo del beb.  La leche tiene anticuerpos que ayudan a Chemical engineer las infecciones en el beb.  El beb tiene una menor incidencia de asma, alergias y del sndrome de muerte sbita del lactante.  Los nutrientes en la [place] materna son mejores para el beb que la [place] maternizada y estn preparados exclusivamente para cubrir las necesidades del beb.  La leche materna mejora el desarrollo cerebral del beb.  Es menos probable que el beb desarrolle otras enfermedades, como obesidad infantil, asma o diabetes mellitus de tipo 2. Para usted   La lactancia materna favorece el desarrollo de un vnculo muy especial entre la madre y el beb.  Es conveniente. La leche materna siempre est disponible a la Tree surgeon y es [place].  La lactancia materna ayuda a quemar caloras y a  perder el peso ganado durante el [place].  Favorece la contraccin del tero al tamao que tena antes del embarazo de manera ms rpida y disminuye el sangrado (loquios) despus del parto.  La lactancia materna contribuye a reducir Catering manager de desarrollar diabetes mellitus de tipo 2, osteoporosis o cncer de mama o de ovario en el futuro. SIGNOS DE QUE EL BEB EST HAMBRIENTO Primeros signos de hambre  Aumenta su estado de [place].  Se estira.  Mueve la cabeza de un lado a otro.  Mueve la cabeza y abre la boca cuando se le toca la mejilla o la comisura de la boca (reflejo de bsqueda).  [place] vocalizaciones, tales como sonidos de succin, se relame los labios, emite arrullos, suspiros, o chirridos.  Mueve la [employer] boca.  Se chupa con ganas los dedos o las manos. Signos tardos de [employer].  Llora de manera intermitente. Signos de BJ's Wholesale signos de hambre extrema requerirn que lo calme y lo consuele antes de que el beb pueda alimentarse adecuadamente. No espere a que se manifiesten los siguientes signos de hambre extrema para comenzar a Economist:   Air cabin crew.  Llanto intenso y fuerte.   Gritos. INFORMACIN BSICA SOBRE LA LACTANCIA MATERNA Iniciacin de la lactancia materna  Encuentre un lugar cmodo para sentarse o acostarse, con un buen respaldo para el cuello y la espalda.  Coloque una almohada o una manta enrollada debajo del beb para acomodarlo a la altura de la mama (si est sentada). Las almohadas para Economist se han diseado especialmente a fin de servir de apoyo para los brazos y el beb Kellogg.  Asegrese de que el abdomen del beb est frente al suyo.  Masajee suavemente la mama. Con las yemas de los dedos, masajee la pared del pecho hacia el pezn en un movimiento circular. Esto estimula el flujo de [place]. Es posible que Oceanographer este movimiento mientras amamanta si la leche fluye  lentamente.  Sostenga la mama con el pulgar por arriba del pezn y los otros 4 dedos por debajo de la mama. Asegrese de que los dedos se encuentren lejos del pezn y de la boca del beb.  Empuje suavemente los labios del beb con el pezn o con el dedo.  Cuando la boca del beb se abra lo suficiente, acrquelo rpidamente a la mama e introduzca todo el pezn y la zona oscura que lo rodea (areola), tanto como sea posible, dentro de la boca del beb.  Debe haber ms areola visible por arriba del labio superior del beb que por debajo del labio inferior.  La lengua del beb  debe estar entre la enca inferior y la [place].  Asegrese de que la boca del beb est en la posicin correcta alrededor del pezn (prendida). Los labios del beb deben crear un sello sobre la mama y estar doblados hacia afuera (invertidos).  Es comn que el beb succione durante 2 a 3 minutos para que comience el flujo de [place]. Cmo debe prenderse Es muy importante que le ensee al beb cmo prenderse adecuadamente a la mama. Si el beb no se prende adecuadamente, puede causarle dolor en el pezn y reducir la produccin de [place], y hacer que el beb tenga un escaso aumento de [place]. Adems, si el beb no se prende adecuadamente al pezn, puede tragar aire durante la alimentacin. Esto puede causarle molestias al beb. Hacer eructar al beb al Eliezer Lofts de mama puede ayudarlo a liberar el aire. Sin embargo, ensearle al beb cmo prenderse a la mama adecuadamente es la mejor manera de evitar que se sienta molesto por tragar Administrator, sports se alimenta. Signos de que el beb se ha prendido adecuadamente al pezn:   Hall Busing o succiona de modo silencioso, sin causarle dolor.  Se escucha que traga cada 3 o 4 succiones.   Hay movimientos musculares por arriba y por delante de sus odos al Mining engineer. Signos de que el beb no se ha prendido Product manager al pezn:   Hace ruidos de succin o de chasquido mientras se  alimenta.  Siente dolor en el pezn. Si cree que el beb no se prendi correctamente, deslice el dedo en la comisura de la boca y Micron Technology las encas del beb para interrumpir la succin. Intente comenzar a amamantar nuevamente. Signos de Economist Signos del beb:   Disminuye gradualmente el nmero de succiones o cesa la succin por completo.  Se duerme.  Relaja el cuerpo.  Retiene una pequea cantidad de [employer]EL Corporation boca.  Se desprende solo del pecho. Signos que presenta usted:  Las mamas han aumentado la firmeza, el peso y el tamao 1 a 3 horas despus de Economist.  Estn ms blandas inmediatamente despus de amamantar.  Un aumento del volumen de [place], y tambin un cambio en su consistencia y color se producen hacia el [place] de Therapist, nutritional.  Los pezones no duelen, ni estn agrietados ni sangran. Signos de que su beb recibe la cantidad de leche suficiente  Moja al menos 3 paales en 24 horas. La orina debe ser clara y de color amarillo plido a los [place].  Defeca al menos 3 veces en 24 horas a los 5 das de vida. La materia fecal debe ser blanda y [place].  Defeca al menos 3 veces en 24 horas a los 7 das de vida. La materia fecal debe ser grumosa y [place].  No registra una prdida de peso mayor del 10% del peso al nacer durante los primeros [place].  Aumenta de peso un promedio de 4 a 7onzas (113 a 198g) por semana despus de los [place].  Aumenta de [place], [place], de [place] uniforme a Proofreader de los 5 das de vida, sin Museum/gallery curator prdida de peso despus de las 2semanas de vida. Despus de alimentarse, es posible que el beb regurgite una pequea cantidad. Esto es frecuente. FRECUENCIA Y DURACIN DE LA LACTANCIA MATERNA El amamantamiento frecuente la ayudar a producir ms [place] y a Warden/ranger de Social research officer, government en los pezones e hinchazn en las [place]. Alimente al beb cuando muestre signos  de hambre o  si siente la necesidad de reducir la congestin de las [place]. Esto se denomina "lactancia a demanda". Evite el uso del chupete mientras trabaja para establecer la lactancia (las primeras 4 a 6 semanas despus del nacimiento del beb). Despus de este perodo, podr ofrecerle un chupete. Las investigaciones demostraron que el uso del chupete durante el primer ao de vida del beb disminuye el riesgo de desarrollar el sndrome de muerte sbita del lactante (SMSL). Permita que el nio se alimente en cada mama todo lo que desee. Contine amamantando al beb hasta que haya terminado de alimentarse. Cuando el beb se desprende o se queda dormido mientras se est alimentando de la primera mama, ofrzcale la segunda. Debido a que, con frecuencia, los recin [place] las primeras semanas de vida, es posible que deba despertar al beb para alimentarlo. Los horarios de Writer de un beb a otro. Sin embargo, las siguientes reglas pueden servir como gua para ayudarla a Engineer, materials que el beb se alimenta adecuadamente:  Se puede amamantar a los recin nacidos (bebs de 4 semanas o menos de vida) cada 1 a 3 horas.  No deben transcurrir ms de 3 horas durante el da o 5 horas durante la noche sin que se amamante a los recin nacidos.  Debe amamantar al beb 8 veces como mnimo en un perodo de 24 horas, hasta que comience a introducir slidos en su dieta, a los 6 meses de vida aproximadamente. [place] extraccin y Recruitment consultant de la leche materna le permiten asegurarse de que el beb se alimente exclusivamente de [place], aun en momentos en los que no puede amamantar. Esto tiene especial importancia si debe regresar al [place] en el perodo en que an est amamantando o si no puede estar presente en los momentos en que el beb debe alimentarse. Su asesor en lactancia puede orientarla sobre cunto tiempo es [place].  El sacaleche es  un aparato que le permite extraer leche de la mama a un recipiente estril. Luego, la leche materna extrada puede almacenarse en un refrigerador o Pension scheme manager. Algunos sacaleches son Theodore Demark, James Ivanoff otros son elctricos. Consulte a su asesor en lactancia qu tipo ser ms conveniente para usted. Los sacaleches se pueden comprar; sin embargo, algunos hospitales y grupos de apoyo a la lactancia materna alquilan Production assistant, radio. Un asesor en lactancia puede ensearle cmo extraer OfficeMax Incorporated, en caso de que prefiera no usar un sacaleche.  CMO CUIDAR LAS MAMAS DURANTE LA LACTANCIA MATERNA Los pezones se secan, agrietan y duelen durante la Therapist, nutritional. Las siguientes recomendaciones pueden ayudarla a Theatre manager las [employer] y sanas:  Art therapist usar jabn en los pezones.  Use un sostn de soporte. Aunque no son esenciales, las camisetas sin mangas o los sostenes especiales para Economist estn diseados para acceder fcilmente a las mamas, para Economist sin tener que quitarse todo el sostn o la camiseta. Evite usar sostenes con aro o sostenes muy ajustados.  Seque al aire sus pezones durante 3 a 72minutos despus de amamantar al beb.  Utilice solo apsitos de Chiropodist sostn para Tax adviser las prdidas de [place]. La prdida de un poco de Owens Corning tomas es normal.  Utilice lanolina sobre los pezones luego de Economist. La lanolina ayuda a mantener la humedad normal de la piel. Si [place] lanolina pura, no tiene que lavarse los pezones antes de volver a Research scientist (life sciences) al beb. La lanolina pura no es  txica para el beb. Adems, puede extraer Cisco algunas gotas de [place] materna y Community education officer suavemente esa [place] Resources, para que la [place] se seque al aire. Durante las primeras semanas despus de dar a luz, algunas mujeres pueden experimentar hinchazn en las mamas (congestin [place]). La congestin puede hacer que sienta las mamas pesadas,  calientes y sensibles al tacto. El pico de la congestin ocurre dentro de los 3 a 5 das despus del [place]. Las siguientes recomendaciones pueden ayudarla a Public house manager la congestin:  Vace por completo las mamas al [place]. Puede aplicar calor hmedo en las mamas (en la ducha o con toallas hmedas para manos) antes de Economist o extraer Northeast Utilities. Esto aumenta la circulacin y [place] a que la [place]. Si el beb no vaca por completo las mamas cuando lo amamanta, extraiga la [place] restante despus de que haya finalizado.  Use un sostn ajustado (para amamantar o comn) o una camiseta sin mangas durante 1 o 2 das para indicar al cuerpo que disminuya ligeramente la produccin de [place].  Aplique compresas de hielo E[employer], a menos que le resulte demasiado incmodo.  Asegrese de que el beb est prendido y se encuentre en la posicin correcta mientras lo alimenta. Si la congestin persiste luego de 48 horas o despus de seguir estas recomendaciones, comunquese con su mdico o un Lobbyist. RECOMENDACIONES GENERALES PARA EL CUIDADO DE LA SALUD DURANTE LA LACTANCIA MATERNA  Consuma alimentos saludables. [place] comidas y colaciones, y coma 3 de cada una por da. Dado que lo que come Solectron Corporation, es posible que algunas comidas hagan que su beb se vuelva ms irritable de lo habitual. Evite comer este tipo de alimentos si percibe que afectan de manera negativa al beb.  Beba leche, jugos de fruta y agua para Engineer, water su sed (aproximadamente [place]).  Descanse con frecuencia, reljese y tome sus vitaminas prenatales para evitar la fatiga, el estrs y la anemia.  Contine con los autocontroles de la mama.  Evite masticar y fumar tabaco.  Evite el consumo de alcohol y drogas. Algunos medicamentos, que pueden ser perjudiciales para el beb, pueden pasar a travs de la SLM Corporation. Es importante que consulte a su mdico antes de Dentist, incluidos todos los medicamentos recetados y de [place], as como los suplementos vitamnicos y herbales. Puede quedar embarazada durante la lactancia. Si desea controlar la natalidad, consulte a su mdico cules son las opciones ms seguras para el beb. SOLICITE ATENCIN MDICA SI:   Usted siente que quiere dejar de Economist o se siente frustrada con la lactancia.  Siente dolor en las mamas o en los pezones.  Sus pezones estn agrietados o Control and instrumentation engineer.  Sus pechos estn irritados, sensibles o calientes.  Tiene un rea hinchada en cualquiera de las mamas.  Siente escalofros o fiebre.  Tiene nuseas o vmitos.  Presenta una secrecin de otro lquido distinto de la leche materna de los pezones.  Sus mamas no se llenan antes de Economist al beb para el quinto da despus del [place].  Se siente triste y deprimida.  El beb est demasiado somnoliento como para comer bien.  El beb tiene problemas para dormir.  Moja menos de 3 paales en 24 horas.  Defeca menos de 3 veces en 24 horas.  La piel del beb o la parte blanca de los ojos se vuelven amarillentas.  El beb no ha aumentado de [place] a los 5  das de vida. SOLICITE ATENCIN MDICA DE INMEDIATO SI:   El beb est muy cansado Engineer, manufacturing) y no se quiere despertar para comer.  Le sube la fiebre sin causa. Document Released: 09/14/2005 Document Revised: 09/19/2013 [facility] Patient Information 2015 [place]. This information is not intended to replace advice given to you by your health care provider. Make sure you discuss any questions you have with your health care provider.

## 2014-05-15 NOTE — Progress Notes (Signed)
   Subjective:    Lynn Burnett is a G3P1011 [redacted]w[redacted]d being seen today for her first obstetrical visit.  Her obstetrical history is significant for GDM-diet controlled, 4th degree lac. No lasting complications. Diabetes testing last your by PCP was normal.  Patient does intend to breast feed. Pregnancy history fully reviewed.  Patient reports no complaints.  Last Pap 2014 in [place]. Normal.   Filed Vitals:   05/15/14 1457  BP: 115/63  Pulse: 67  Temp: 98.2 F (36.8 C)  Weight: 157 lb 3.2 oz (71.305 kg)    HISTORY: OB History  Gravida Para Term Preterm AB SAB TAB Ectopic Multiple Living  3 1 1  0 1 1 0 0 0 1    # Outcome Date GA Lbr Len/2nd Weight Sex Delivery Anes PTL Lv  3 CUR           2 TRM 01/28/12 [redacted]w[redacted]d / 07:26 7 lb 7 oz (3.374 kg) M VAC EPI  Y     Comments: mongolian spots on left shoulder, 4th degree repaired  1 SAB 11/2010 [redacted]w[redacted]d            Comments: No D&C     Past Medical History  Diagnosis Date  . Diabetes mellitus     prediabetes per MD no meds   History reviewed. No pertinent past surgical history. Family History  Problem Relation Age of Onset  . Cancer Mother     cervical  . Miscarriages / Stillbirths Mother   . Cancer Maternal Aunt      Exam   SIUP FHR 175 Uterus:     Pelvic Exam: Deferred  System: Breast:  normal appearance, no masses or tenderness   Skin: normal coloration and turgor, no rashes    Neurologic: oriented, normal, normal mood   Extremities: normal strength, tone, and muscle mass   HEENT PERRLA and sclera clear, anicteric   Mouth/Teeth mucous membranes moist, pharynx normal without lesions and dental hygiene good   Neck supple and no masses   Cardiovascular: regular rate and rhythm, no murmurs or gallops   Respiratory:  appears well, vitals normal, no respiratory distress, acyanotic, normal RR, neck free of mass or lymphadenopathy, chest clear, no wheezing, crepitations, rhonchi, normal symmetric air entry   Abdomen: soft,  non-tender; bowel sounds normal; no masses,  no organomegaly   Urinary: Deferred      Assessment:    Pregnancy: G3P1011 Supervision of normal pregnancy History of A1GDM.    Plan:     Initial labs drawn. Early GTT. Prenatal vitamins. Problem list reviewed and updated. Genetic Screening discussed First Screen: Declined. Ultrasound discussed; fetal survey: requested. Discussed primary C-section due to history of fourth degree laceration. Patient uncertain. Follow up in 4 weeks. 50% of 30 min visit spent on counseling and coordination of care.    Manya Silvas 05/15/2014

## 2014-05-15 NOTE — Progress Notes (Signed)
Patient referred from [facility].  Initial labs today and early 1hr gtt given history of gestational diabetes; labs to be drawn at 25.  Pap today.  New OB packet given.

## 2014-05-15 NOTE — Progress Notes (Signed)
Bedside US for viability;   FHR = 175 per PW doppler

## 2014-05-16 LAB — OBSTETRIC PANEL
Antibody Screen: NEGATIVE
BASOS PCT: 0 % (ref 0–1)
Basophils Absolute: 0 10*3/uL (ref 0.0–0.1)
EOS ABS: 0.1 10*3/uL (ref 0.0–0.7)
Eosinophils Relative: 1 % (ref 0–5)
HCT: 37.6 % (ref 36.0–46.0)
HEMOGLOBIN: 13.2 g/dL (ref 12.0–15.0)
HEP B S AG: NEGATIVE
LYMPHS ABS: 1.7 10*3/uL (ref 0.7–4.0)
Lymphocytes Relative: 29 % (ref 12–46)
MCH: 32.1 pg (ref 26.0–34.0)
MCHC: 35.1 g/dL (ref 30.0–36.0)
MCV: 91.5 fL (ref 78.0–100.0)
MONOS PCT: 7 % (ref 3–12)
Monocytes Absolute: 0.4 10*3/uL (ref 0.1–1.0)
NEUTROS ABS: 3.8 10*3/uL (ref 1.7–7.7)
NEUTROS PCT: 63 % (ref 43–77)
PLATELETS: 193 10*3/uL (ref 150–400)
RBC: 4.11 MIL/uL (ref 3.87–5.11)
RDW: 14.2 % (ref 11.5–15.5)
RH TYPE: POSITIVE
Rubella: 5.55 Index — ABNORMAL HIGH (ref <0.90)
WBC: 6 10*3/uL (ref 4.0–10.5)

## 2014-05-16 LAB — HIV ANTIBODY (ROUTINE TESTING W REFLEX): HIV 1&2 Ab, 4th Generation: NONREACTIVE

## 2014-05-16 LAB — GC/CHLAMYDIA PROBE AMP, URINE
CHLAMYDIA, SWAB/URINE, PCR: NEGATIVE
GC PROBE AMP, URINE: NEGATIVE

## 2014-05-16 LAB — GLUCOSE TOLERANCE, 1 HOUR (50G) W/O FASTING: GLUCOSE 1 HOUR GTT: 151 mg/dL — AB (ref 70–140)

## 2014-05-17 ENCOUNTER — Telehealth: Payer: Self-pay | Admitting: *Deleted

## 2014-05-17 DIAGNOSIS — O0990 Supervision of high risk pregnancy, unspecified, unspecified trimester: Secondary | ICD-10-CM | POA: Insufficient documentation

## 2014-05-17 LAB — CULTURE, OB URINE
COLONY COUNT: NO GROWTH
Organism ID, Bacteria: NO GROWTH

## 2014-05-17 NOTE — Telephone Encounter (Signed)
Attempted to contact patient, no answer, message left via [place] interpreter for patient to call the clinic.

## 2014-05-17 NOTE — Telephone Encounter (Signed)
Message copied by Sue Lush on Thu May 17, 2014  2:12 PM ------      Message from: [place], [place]      Created: Thu May 17, 2014 12:20 PM       Elevated 1 hour GTT. Needs 3 hour. ------

## 2014-05-19 ENCOUNTER — Encounter: Payer: Self-pay | Admitting: Advanced Practice Midwife

## 2014-05-19 DIAGNOSIS — Z8632 Personal history of gestational diabetes: Secondary | ICD-10-CM

## 2014-05-19 DIAGNOSIS — O09299 Supervision of pregnancy with other poor reproductive or obstetric history, unspecified trimester: Secondary | ICD-10-CM | POA: Insufficient documentation

## 2014-05-21 NOTE — Telephone Encounter (Signed)
Called patient on her home number and it was disconnected. Lynn Burnett called her emergency contact and spoke with her, she stated that the patient usually calls her at 11am because she keeps the baby. Patient will call Lynn Burnett.

## 2014-05-21 NOTE — Telephone Encounter (Signed)
Pt coming tomorrow morning 8 am

## 2014-05-22 ENCOUNTER — Other Ambulatory Visit: Payer: Self-pay

## 2014-05-22 DIAGNOSIS — R7309 Other abnormal glucose: Secondary | ICD-10-CM

## 2014-05-23 LAB — GLUCOSE TOLERANCE, 3 HOURS
GLUCOSE, 1 HOUR-GESTATIONAL: 216 mg/dL — AB (ref 70–189)
GLUCOSE, FASTING-GESTATIONAL: 91 mg/dL (ref 70–104)
Glucose Tolerance, 2 hour: 205 mg/dL — ABNORMAL HIGH (ref 70–164)
Glucose, GTT - 3 Hour: 160 mg/dL — ABNORMAL HIGH (ref 70–144)

## 2014-05-28 ENCOUNTER — Ambulatory Visit (INDEPENDENT_AMBULATORY_CARE_PROVIDER_SITE_OTHER): Payer: Self-pay | Admitting: Obstetrics & Gynecology

## 2014-05-28 ENCOUNTER — Encounter: Payer: Self-pay | Attending: Obstetrics & Gynecology | Admitting: *Deleted

## 2014-05-28 DIAGNOSIS — O24919 Unspecified diabetes mellitus in pregnancy, unspecified trimester: Secondary | ICD-10-CM

## 2014-05-28 DIAGNOSIS — Z713 Dietary counseling and surveillance: Secondary | ICD-10-CM | POA: Insufficient documentation

## 2014-05-28 DIAGNOSIS — O24311 Unspecified pre-existing diabetes mellitus in pregnancy, first trimester: Secondary | ICD-10-CM

## 2014-05-28 DIAGNOSIS — O09299 Supervision of pregnancy with other poor reproductive or obstetric history, unspecified trimester: Secondary | ICD-10-CM

## 2014-05-28 DIAGNOSIS — E119 Type 2 diabetes mellitus without complications: Secondary | ICD-10-CM

## 2014-05-28 DIAGNOSIS — O0992 Supervision of high risk pregnancy, unspecified, second trimester: Secondary | ICD-10-CM

## 2014-05-28 DIAGNOSIS — O0990 Supervision of high risk pregnancy, unspecified, unspecified trimester: Secondary | ICD-10-CM

## 2014-05-28 DIAGNOSIS — Z8632 Personal history of gestational diabetes: Secondary | ICD-10-CM

## 2014-05-28 DIAGNOSIS — O09292 Supervision of pregnancy with other poor reproductive or obstetric history, second trimester: Secondary | ICD-10-CM

## 2014-05-28 NOTE — Progress Notes (Signed)
  Patient was seen on 05/28/14 for Gestational Diabetes self-management . She was seen on 05/15/14 for initial OB visit. She received a call notifying her of GDM visit.  The following learning objectives were met by the patient :   States the definition of Gestational Diabetes  States why dietary management is important in controlling blood glucose  Describes the effects of carbohydrates on blood glucose levels  States when to check blood glucose levels  Demonstrates proper blood glucose monitoring techniques  States the effect of stress and exercise on blood glucose levels  Plan:  Consider  increasing your activity level by walking daily as tolerated Begin checking BG before breakfast and 2 hours after first bit of breakfast, lunch and dinner after  as directed by MD  Take medication  as directed by MD  Blood glucose monitor given: TrueTrack Lot # [id number] Exp: 06/17/16 Blood glucose reading: 2hpp $RemoveBefor'95mg'SWCvwfIsCAQJ$ /dl  Patient instructed to monitor glucose levels: FBS: 60 - <90 2 hour: <120  Patient received the following handouts:  Nutrition Diabetes and Pregnancy (Spanish)  Patient will be seen for follow-up as needed.

## 2014-05-28 NOTE — Progress Notes (Signed)
Nutrition note: GDM diet education Pt is a newly diagnosed GDM pt. Pt had GDM with previous pregnancy that she controlled with diet & exercise. Pt reports eating 3 meals & 2 snacks/d. Pt is taking a PNV. Pt reports some nausea and heartburn occ. NKFA. Pt reports walking 5-6x/wk. Pt received verbal & written education in Spanish via interpreter for GDM diet review.  Pt agrees to follow GDM diet with 3 meals & 2-3 snacks/d with proper CHO/ protein combination. Pt has [place] & plans to BF. F/u in 4-6 wks Vladimir Faster, MS, RD, LDN, [facility]

## 2014-06-08 ENCOUNTER — Encounter (HOSPITAL_COMMUNITY): Payer: Self-pay | Admitting: *Deleted

## 2014-06-08 ENCOUNTER — Inpatient Hospital Stay (HOSPITAL_COMMUNITY): Payer: Self-pay

## 2014-06-08 ENCOUNTER — Inpatient Hospital Stay (HOSPITAL_COMMUNITY)
Admission: AD | Admit: 2014-06-08 | Discharge: 2014-06-08 | Disposition: A | Discharge status: Home / Self Care | Payer: Self-pay | Source: Ambulatory Visit | Attending: Obstetrics and Gynecology | Admitting: Obstetrics and Gynecology

## 2014-06-08 DIAGNOSIS — O4690 Antepartum hemorrhage, unspecified, unspecified trimester: Secondary | ICD-10-CM

## 2014-06-08 DIAGNOSIS — O4692 Antepartum hemorrhage, unspecified, second trimester: Secondary | ICD-10-CM

## 2014-06-08 DIAGNOSIS — O209 Hemorrhage in early pregnancy, unspecified: Secondary | ICD-10-CM | POA: Insufficient documentation

## 2014-06-08 LAB — URINALYSIS, ROUTINE W REFLEX MICROSCOPIC
BILIRUBIN URINE: NEGATIVE
GLUCOSE, UA: NEGATIVE mg/dL
KETONES UR: 15 mg/dL — AB
LEUKOCYTES UA: NEGATIVE
Nitrite: NEGATIVE
PH: 6 (ref 5.0–8.0)
PROTEIN: NEGATIVE mg/dL
Specific Gravity, Urine: <1.005 — ABNORMAL LOW (ref 1.005–1.030)
Urobilinogen, UA: 0.2 mg/dL (ref 0.0–1.0)

## 2014-06-08 LAB — URINE MICROSCOPIC-ADD ON

## 2014-06-08 NOTE — MAU Provider Note (Signed)
Chief Complaint: Vaginal Bleeding   First Provider Initiated Contact with Patient 06/08/14 1131     SUBJECTIVE HPI: Lynn Burnett is a 33 y.o. G3P1011 at [redacted]w[redacted]d by LMP who presents with first episode of vaginal bleeding this morning. Passed a clot that she thought that like to fetus coming out. Has continued to have some red bleeding. No antecedent intercourse. No irritative vaginal symptoms. GC/CT neg at [facility] 3 wk ago. Bpos  PNC at [facility] significant for  A2/B DM  Past Medical History  Diagnosis Date  . Diabetes mellitus     prediabetes per MD no meds   OB History  Gravida Para Term Preterm AB SAB TAB Ectopic Multiple Living  3 1 1  0 1 1 0 0 0 1    # Outcome Date GA Lbr Len/2nd Weight Sex Delivery Anes PTL Lv  3 CUR           2 TRM 01/28/12 [redacted]w[redacted]d / 07:26 7 lb 7 oz (3.374 kg) M VAC EPI  Y     Comments: mongolian spots on left shoulder, 4th degree repaired  1 SAB 11/2010 [redacted]w[redacted]d            Comments: No D&C     History reviewed. No pertinent past surgical history. History   Social History  . Marital Status: Married    Spouse Name: N/A    Number of Children: N/A  . Years of Education: N/A   Occupational History  . Not on file.   Social History Main Topics  . Smoking status: Never Smoker   . Smokeless tobacco: Never Used  . Alcohol Use: No  . Drug Use: No  . Sexual Activity: Yes    Birth Control/ Protection: Condom, None   Other Topics Concern  . Not on file   Social History Narrative  . No narrative on file   No current facility-administered medications on file prior to encounter.   Current Outpatient Prescriptions on File Prior to Encounter  Medication Sig Dispense Refill  . Prenatal Vit-Fe Fumarate-FA (PRENATAL MULTIVITAMIN) TABS Take 1 tablet by mouth daily.       No Known Allergies  ROS: Pertinent items in HPI  OBJECTIVE Blood pressure 104/51, pulse 67, temperature 98.7 F (37.1 C), temperature source Oral, resp. rate 20, height 5\' 3"  (1.6 m), weight 153  lb 9.6 oz (69.673 kg), last menstrual period 03/14/2014, currently breastfeeding. GENERAL: Well-developed, well-nourished female in no acute distress.  HEENT: Normocephalic HEART: normal rate RESP: normal effort ABDOMEN: Soft, non-tender. DT 157 EXTREMITIES: Nontender, no edema NEURO: Alert and oriented SPECULUM EXAM: NEFG,  discharge, moderate red blood noted, cervix clean BIMANUAL: cervix FT, closed; uterus , no adnexal tenderness or masses  LAB RESULTS Results for orders placed during the hospital encounter of 06/08/14 (from the past 24 hour(s))  URINALYSIS, ROUTINE W REFLEX MICROSCOPIC     Status: Abnormal   Collection Time    06/08/14 10:00 AM      Result Value Ref Range   Color, Urine YELLOW  YELLOW   APPearance CLEAR  CLEAR   Specific Gravity, Urine <1.005 (*) 1.005 - 1.030   pH 6.0  5.0 - 8.0   Glucose, UA NEGATIVE  NEGATIVE mg/dL   Hgb urine dipstick LARGE (*) NEGATIVE   Bilirubin Urine NEGATIVE  NEGATIVE   Ketones, ur 15 (*) NEGATIVE mg/dL   Protein, ur NEGATIVE  NEGATIVE mg/dL   Urobilinogen, UA 0.2  0.0 - 1.0 mg/dL   Nitrite NEGATIVE  NEGATIVE  Leukocytes, UA NEGATIVE  NEGATIVE  URINE MICROSCOPIC-ADD ON     Status: Abnormal   Collection Time    06/08/14 10:00 AM      Result Value Ref Range   Squamous Epithelial / LPF FEW (*) RARE   RBC / HPF 0-2  <3 RBC/hpf   Bacteria, UA RARE  RARE    IMAGING US Ob Comp Less 14 Wks  06/08/2014   CLINICAL DATA:  Vaginal bleeding  EXAM: OBSTETRIC <14 WK ULTRASOUND  TECHNIQUE: Transabdominal ultrasound was performed for evaluation of the gestation as well as the maternal uterus and adnexal regions.  COMPARISON:  None.  FINDINGS: Intrauterine gestational sac: Visualized/normal in shape.  Yolk sac:  Not visualized  Embryo:  Single  Cardiac Activity: Present  Heart Rate: 150 bpm  CRL:   5.9 cm  12 w 3 d                  [place] EDC: 12/18/2014  Maternal uterus/adnexae: The ovaries are within normal limits. The uterus is diffusely  heterogeneous of uncertain significance. This may be related to a degree of pelvic congestion or possibly diffuse fibroid disease.  IMPRESSION: Single live intrauterine gestation at 12 weeks 3 days.  Heterogeneous uterus of uncertain significance. This may be related to diffuse myomatous disease or pelvic congestion.  This exam is performed on an emergent basis and does not comprehensively evaluate fetal size, dating, or anatomy; follow-up complete OB US should be considered if further fetal assessment is warranted.   Electronically Signed   By: Inez Catalina M.D.   On: 06/08/2014 13:03     MAU COURSE Care assumed by Kerry Hough, PA at [phone] - Care assumed from Candis Musa, CNM   ASSESSMENT 1. Vaginal bleeding in pregnancy, second trimester     PLAN Discharge home Bleeding precautions discussed Patient advised to follow-up with [facility] on Monday as scheduled for routine prenatal care Patient may return to MAU as needed or if her condition were to change or worsen      Follow-up Information   Follow up with [facility]. (As scheduled)    Specialty:  Obstetrics and Gynecology   Contact information:   [place] [address] [phone]      Follow up with [facility]. (As needed, If symptoms worsen)    Contact information:   [address] [id number] [place] [address] [phone]       Medication List         prenatal multivitamin Tabs tablet  Take 1 tablet by mouth daily.         Luvenia Redden, PA-C 06/08/2014  1:28 PM

## 2014-06-08 NOTE — MAU Note (Signed)
Pt passed large clot with bright red bleeding @ 0830, states she has scant bleeding now.  C/O mild lower back pain - started after passing clot.

## 2014-06-08 NOTE — Discharge Instructions (Signed)
Reposo plvico  (Pelvic Rest) El reposo plvico se recomienda a las mujeres cuando:   La placenta cubre parcial o completamente la abertura del cuello del tero (placenta previa).  Hay sangrado entre la pared del tero y el saco amnitico en el primer trimestre (hemorragia subcorinica).  El cuello uterino comienza a abrirse sin iniciarse el trabajo de parto (cuello uterino incompetente, insuficiencia cervical).  El [place] de parto se inicia muy pronto (parto prematuro). INSTRUCCIONES PARA EL CUIDADO EN EL HOGAR   No tenga relaciones sexuales, estimulacin, ni orgasmos.  No use tampones, no se haga duchas vaginales ni coloque ningn objeto en la vagina.  No levante objetos que pesen ms de 10 libras (4,5 kg).  Evite las actividades extenuantes o tensionar los msculos de la pelvis. SOLICITE ATENCIN MDICA SI:   Tiene un sangrado vaginal durante el embarazo. Considrelo como una posible emergencia.  Siente clicos en la zona baja del estmago (ms fuertes que los clicos menstruales).  Nota flujo vaginal (acuoso, con moco o [place]).  Siente un dolor en la espalda leve y sordo.  Tiene contracciones regulares o endurecimiento del tero. SOLICITE ATENCIN MDICA DE INMEDIATO SI:  Observa sangrado vaginal y tiene placenta previa.  Document Released: 06/08/2012 [facility] Patient Information 2015 [place]. This information is not intended to replace advice given to you by your health care provider. Make sure you discuss any questions you have with your health care provider.

## 2014-06-08 NOTE — Progress Notes (Addendum)
Pt currently has a small amount of brown-red blood noted on pad, less than size of two 2 quarters. Pt states she has some pain and pointed her LRQ.

## 2014-06-11 ENCOUNTER — Ambulatory Visit (INDEPENDENT_AMBULATORY_CARE_PROVIDER_SITE_OTHER): Payer: Self-pay | Admitting: Obstetrics and Gynecology

## 2014-06-11 ENCOUNTER — Encounter: Payer: Self-pay | Admitting: Obstetrics and Gynecology

## 2014-06-11 VITALS — BP 110/64 | HR 67 | Temp 98.2°F | Wt 152.4 lb

## 2014-06-11 DIAGNOSIS — Z8632 Personal history of gestational diabetes: Secondary | ICD-10-CM

## 2014-06-11 DIAGNOSIS — O24311 Unspecified pre-existing diabetes mellitus in pregnancy, first trimester: Secondary | ICD-10-CM

## 2014-06-11 DIAGNOSIS — O0990 Supervision of high risk pregnancy, unspecified, unspecified trimester: Secondary | ICD-10-CM

## 2014-06-11 DIAGNOSIS — O24919 Unspecified diabetes mellitus in pregnancy, unspecified trimester: Secondary | ICD-10-CM

## 2014-06-11 DIAGNOSIS — Z23 Encounter for immunization: Secondary | ICD-10-CM

## 2014-06-11 DIAGNOSIS — O0992 Supervision of high risk pregnancy, unspecified, second trimester: Secondary | ICD-10-CM

## 2014-06-11 DIAGNOSIS — O09293 Supervision of pregnancy with other poor reproductive or obstetric history, third trimester: Secondary | ICD-10-CM

## 2014-06-11 DIAGNOSIS — O09299 Supervision of pregnancy with other poor reproductive or obstetric history, unspecified trimester: Secondary | ICD-10-CM

## 2014-06-11 DIAGNOSIS — E119 Type 2 diabetes mellitus without complications: Secondary | ICD-10-CM

## 2014-06-11 DIAGNOSIS — O09291 Supervision of pregnancy with other poor reproductive or obstetric history, first trimester: Secondary | ICD-10-CM

## 2014-06-11 LAB — POCT URINALYSIS DIP (DEVICE)
Bilirubin Urine: NEGATIVE
GLUCOSE, UA: NEGATIVE mg/dL
Hgb urine dipstick: NEGATIVE
Ketones, ur: NEGATIVE mg/dL
Leukocytes, UA: NEGATIVE
NITRITE: NEGATIVE
PROTEIN: NEGATIVE mg/dL
Specific Gravity, Urine: 1.02 (ref 1.005–1.030)
Urobilinogen, UA: 0.2 mg/dL (ref 0.0–1.0)
pH: 7 (ref 5.0–8.0)

## 2014-06-11 LAB — US OB COMP LESS 14 WKS

## 2014-06-11 NOTE — Progress Notes (Signed)
Bedside US for viability = FHR - 166 per PW doppler.  FM observed.

## 2014-06-11 NOTE — Progress Notes (Signed)
Patient is doing well. CBGs majority within range fasting as high as 105 (most in the 90's). Continue with diet and exercise

## 2014-06-11 NOTE — Progress Notes (Signed)
Pt in MAU on Friday for bright red bleeding.  Request Flu vaccine

## 2014-06-12 NOTE — MAU Provider Note (Signed)
Attestation of Attending Supervision of Advanced Practitioner (CNM/NP): Evaluation and management procedures were performed by the Advanced Practitioner under my supervision and collaboration.  I have reviewed the Advanced Practitioner's note and chart, and I agree with the management and plan.  Loyed Wilmes 06/12/2014 9:33 AM

## 2014-07-02 ENCOUNTER — Encounter: Payer: Self-pay | Admitting: Family Medicine

## 2014-07-02 ENCOUNTER — Ambulatory Visit (INDEPENDENT_AMBULATORY_CARE_PROVIDER_SITE_OTHER): Payer: Self-pay | Admitting: Family Medicine

## 2014-07-02 VITALS — BP 113/63 | HR 62 | Wt 152.0 lb

## 2014-07-02 DIAGNOSIS — O24312 Unspecified pre-existing diabetes mellitus in pregnancy, second trimester: Secondary | ICD-10-CM

## 2014-07-02 DIAGNOSIS — O24912 Unspecified diabetes mellitus in pregnancy, second trimester: Secondary | ICD-10-CM

## 2014-07-02 DIAGNOSIS — O0992 Supervision of high risk pregnancy, unspecified, second trimester: Secondary | ICD-10-CM

## 2014-07-02 LAB — POCT URINALYSIS DIP (DEVICE)
BILIRUBIN URINE: NEGATIVE
Glucose, UA: NEGATIVE mg/dL
Hgb urine dipstick: NEGATIVE
Ketones, ur: NEGATIVE mg/dL
LEUKOCYTES UA: NEGATIVE
NITRITE: NEGATIVE
PH: 6.5 (ref 5.0–8.0)
PROTEIN: NEGATIVE mg/dL
Specific Gravity, Urine: 1.01 (ref 1.005–1.030)
UROBILINOGEN UA: 0.2 mg/dL (ref 0.0–1.0)

## 2014-07-02 LAB — HEMOGLOBIN A1C
Hgb A1c MFr Bld: 5.5 % (ref <5.7)
MEAN PLASMA GLUCOSE: 111 mg/dL (ref <117)

## 2014-07-02 LAB — TSH: TSH: 2.511 u[IU]/mL (ref 0.350–4.500)

## 2014-07-02 NOTE — Progress Notes (Signed)
Alex interpreter used FBs 84-103 (14 of 21 out of range) most in the 90's 2 hr pp 78-146 (4 of 60 out of range--dietary) No addition of meds right now--since most are close or in range Baseline labs today Baby ASA Optho referral Schedule anatomy

## 2014-07-02 NOTE — Patient Instructions (Addendum)
Diabetes mellitus gestacional (Gestational Diabetes Mellitus) La diabetes mellitus gestacional, ms comnmente conocida como diabetes gestacional es un tipo de diabetes que desarrollan algunas mujeres durante el [place]. En la diabetes gestacional, el pncreas no produce suficiente insulina (una hormona) o las clulas son menos sensibles a la insulina producida (resistencia a la insulina), o ambas cosas. Normalmente, la [employer] Corporation azcares de los alimentos a las clulas de los tejidos. Las clulas de los tejidos [employer] azcares para Dealer. La falta de insulina o la falta de una respuesta normal a la insulina hace que el exceso de azcar se acumule en la sangre en lugar de Location manager en las clulas de los tejidos. Como resultado, se producen niveles altos de Dispensing optician (hiperglucemia). El efecto de los niveles altos de Location manager (glucosa) puede causar muchos problemas.  FACTORES DE RIESGO Usted tiene mayor probabilidad de desarrollar diabetes gestacional si tiene antecedentes familiares de diabetes y tambin si tiene uno o ms de los siguientes factores de riesgo:  ndice de masa corporal superior a 30 (obesidad).  Embarazo previo con diabetes gestacional.  La edad avanzada en el momento del embarazo. Si se mantienen los niveles de glucosa en la sangre en un rango normal durante el [place], las mujeres pueden tener un embarazo saludable. Si los niveles de glucosa en la sangre no estn bien controlados, puede haber riesgos para usted, el feto o el recin nacido, o durante el trabajo de parto y [place].  SNTOMAS  Si se presentan sntomas, stos son similares a los sntomas que normalmente experimentar durante el embarazo. Los sntomas de la diabetes gestacional son:   Lovey Newcomer de la sed (polidipsia).  Aumento de la miccin (poliuria).  Orina con ms frecuencia durante la noche (nocturia).  Prdida de peso. La prdida de peso puede ser muy rpida.  Infecciones  frecuentes y recurrentes.  Cansancio (fatiga).  Debilidad.  Cambios en la visin, como visin borrosa.  Olor a Medical illustrator.  Dolor abdominal. DIAGNSTICO La diabetes se diagnostica cuando hay aumento de los niveles de glucosa en la [place]. El nivel de glucosa en la sangre puede controlarse en uno o ms de los siguientes anlisis de sangre:  Medicin de glucosa en la sangre en [place]. No se le permitir comer durante al menos 8 horas antes de que se tome [place] de [place].  Pruebas al azar de glucosa en la sangre. El nivel de glucosa en la sangre se controla en cualquier momento del da sin importar el momento en que haya comido.  Prueba de A1c (hemoglobina glucosilada) Una prueba de A1c proporciona informacin sobre el control de la glucosa en la sangre durante los ltimos 3 meses.  Prueba de tolerancia a la glucosa oral (PTGO). La glucosa en la sangre se mide despus de no haber comido (ayunas) durante una a tres horas y despus de beber una bebida que contenga glucosa. Dado que las hormonas que causan la resistencia a la insulina son ms altas alrededor Navistar International Corporation 24 a 28 de Media planner, generalmente se realiza una PTGO durante ese tiempo. Si tiene factores de riesgo de diabetes gestacional, su mdico puede hacerle estudios de Programme researcher, broadcasting/film/video antes de las 24semanas de [place]. TRATAMIENTO   Usted tendr que tomar medicamentos para la diabetes o insulina diariamente para Theatre manager los niveles de glucosa en la sangre en el rango deseado.  Usted tendr [employer]aya dosis de insulina con la actividad fsica y la eleccin de alimentos saludables. El Fidelity del  tratamiento es mantener el nivel de azcar en la sangre previo a comer (preprandial) y durante la noche entre 60 y 99mg/dl, durante todo el embarazo. El objetivo del tratamiento es mantener el nivel pico de azcar en la sangre despus de comer (glucosa posprandial) entre 100y 140mg/dl. INSTRUCCIONES PARA EL CUIDADO EN EL  HOGAR   Controle su nivel de hemoglobina A1c dos veces al ao.  Contrlese a diario el nivel de glucosa en la sangre segn las indicaciones de su mdico. Es comn realizar controles frecuentes de la glucosa en la sangre.  Supervise las cetonas en la orina cuando est enferma y segn las indicaciones de su mdico.  Tome el medicamento para la diabetes y adminstrese insulina segn las indicaciones de su mdico para mantener el nivel de glucosa en la sangre en el rango deseado.  Nunca se quede sin medicamento para la diabetes o sin insulina. Es necesario que la reciba todos los das.  Ajuste la insulina segn la ingesta de hidratos de carbono. Los hidratos de carbono pueden aumentar los niveles de glucosa en la sangre, pero deben incluirse en su dieta. Los hidratos de carbono aportan vitaminas, minerales y fibra que son una parte esencial de una dieta saludable. Los hidratos de carbono se encuentran en frutas, verduras, cereales integrales, productos lcteos, legumbres y alimentos que contienen azcares aadidos.  Consuma alimentos saludables. Alterne 3 comidas con 3 colaciones.  Aumente de peso saludablemente. El aumento del peso total vara de acuerdo con el ndice de masa corporal que tena antes del embarazo (IMC).  Lleve una tarjeta de alerta mdica o use una pulsera o medalla de alerta mdica.  Lleve con usted una colacin de 15gramos de hidratos de carbono en todo momento para controlar los niveles bajos de glucosa en la sangre (hipoglucemia). Algunos ejemplos de colaciones de 15gramos de hidratos de carbono son los siguientes:  Tabletas de glucosa, 3 o 4.  Gel de glucosa, tubo de 15 gramos.  Pasas de uva, 2 cucharadas (24 g).  Caramelos de goma, 6.  Galletas de animales, 8.  Jugo de fruta, gaseosa comn, o leche descremada, 4 onzas (120 ml).  Pastillas de goma, 9.  Reconocer la hipoglucemia. Durante el embarazo la hipoglucemia se produce cuando hay niveles de glucosa en la  sangre de 60 mg/dl o menos. El riesgo de hipoglucemia aumenta durante el ayuno o cuando se saltea las comidas, durante o despus de realizar ejercicio intenso y mientras duerme. Los sntomas de hipoglucemia son:  Temblores o sacudidas.  Disminucin de la capacidad de concentracin.  Sudoracin.  Aumento de la frecuencia cardaca.  Dolor de cabeza.  Sequedad en la boca.  Hambre.  Irritabilidad.  Ansiedad.  Sueo agitado.  Alteracin del habla o de la coordinacin.  Confusin.  Tratar la hipoglucemia rpidamente. Si usted est alerta y puede tragar con seguridad, siga la regla de 15/15 que consiste en:  Tome entre 15 y 20gramos de glucosa de accin rpida o carbohidratos. Las opciones de accin rpida son un gel de glucosa, tabletas de glucosa, o 4 onzas (120 ml) de jugo de frutas, gaseosa comn, o leche baja en grasa.  Compruebe su nivel de glucosa en la sangre 15 minutos despus de tomar la glucosa.  Tome entre 15 y 20 gramos ms de glucosa si el nivel de glucosa en la sangre todava es de 70mg/dl o inferior.  Ingiera una comida o una colacin en el lapso de 1 hora una vez que los niveles de glucosa en la sangre vuelven   a la normalidad.  Est atento a la poliuria (miccin excesiva) y la polidipsia (sensacin de mucha sed), que son los primeros signos de la hiperglucemia. El reconocimiento temprano de la hiperglucemia permite un tratamiento oportuno. Trate la hiperglucemia segn le indic su mdico.  Haga actividad fsica por lo menos 26minutos al da o como lo indique su mdico. Se recomienda que 30 minutos despus de cada comida, realice diez minutos de actividad fsica para controlar los niveles de glucosa postprandial en la [place].  Ajuste su dosis de insulina y la ingesta de alimentos, segn sea necesario, si inicia un nuevo ejercicio o deporte.  Siga su plan para los das de enfermedad cuando no pueda comer o beber como de [place].  Evite el tabaco y el  alcohol.  Concurra a todas las visitas de control como se lo haya indicado el mdico.  Siga el consejo del mdico respecto a los controles prenatales y posteriores al parto (postparto), las visitas, la planificacin de las comidas, el ejercicio, los medicamentos, las vitaminas, los anlisis de [place], otras pruebas mdicas y [place] fsicas.  Realice diariamente el cuidado de la piel y de los pies. Examine su piel y los pies diariamente para ver si tiene cortes, moretones, enrojecimiento, problemas en las uas, sangrado, ampollas o Pension scheme manager.  Cepllese los dientes y encas por lo menos dos veces al da y use hilo dental al menos una vez por da. Concurra regularmente a las visitas de control con el dentista.  Programe un examen de vista durante el primer trimestre de su embarazo o como lo indique su mdico.  Comparta su plan de control de diabetes en el trabajo o en la escuela.  [place].  Aprenda a Engineer, maintenance (IT).  Obtenga la mayor cantidad posible de informacin sobre la diabetes y solicite ayuda siempre que sea necesario.  Obtenga informacin sobre el amamantamiento y analice esta posibilidad.  Debe controlar el nivel de azcar en la sangre de 6a 12semanas despus del parto. Esto se hace con una prueba de tolerancia a la glucosa oral (PTGO). SOLICITE ATENCIN MDICA SI:   No puede comer alimentos o beber por ms de 6 horas.  Tuvo nuseas o ha vomitado durante ms de 6 horas.  Tiene un nivel de glucosa en la sangre de 200 mg/dl y cetonas en la orina.  Presenta algn cambio en el estado mental.  Desarrolla problemas de visin.  Sufre un dolor persistente de Pensions consultant.  Siente dolor o molestias en la parte superior del abdomen.  Desarrolla una enfermedad grave adicional.  Tuvo diarrea durante ms de 6 horas.  Ha estado enfermo o ha tenido fiebre durante un par de das y no mejora. SOLICITE ATENCIN MDICA DE INMEDIATO SI:   Tiene dificultad  para respirar.  Ya no siente los movimientos del beb.  Est sangrando o tiene flujo vaginal.  Comienza a tener contracciones o trabajo de [place] prematuro. ASEGRESE DE QUE:  Comprende estas instrucciones.  Controlar su afeccin.  Recibir ayuda de inmediato si no mejora o si empeora. Document Released: 06/24/2005 Document Revised: 01/29/2014 [facility] Patient Information 2015 [place]. This information is not intended to replace advice given to you by your health care provider. Make sure you discuss any questions you have with your health care provider.  Lactancia materna (Breastfeeding) Decidir Economist es una de las mejores elecciones que puede hacer por usted y su beb. El cambio hormonal durante el Media planner produce el desarrollo del tejido mamario y [place] la cantidad  y el tamao de los conductos galactforos. Estas hormonas tambin permiten que las protenas, los azcares y las grasas de la sangre produzcan la Northeast Utilities materna en las glndulas productoras de [place]. Las hormonas impiden que la leche materna sea liberada antes del nacimiento del beb, adems de impulsar el flujo de leche luego del nacimiento. Una vez que ha comenzado a Economist, Freight forwarder beb, as Therapist, occupational succin o Social research officer, government, pueden estimular la liberacin de [place] de las glndulas productoras de [place].  LOS BENEFICIOS DE AMAMANTAR Para el beb  La primera leche (calostro) ayuda a Garment/textile technologist funcionamiento del sistema digestivo del beb.  La leche tiene anticuerpos que ayudan a Chemical engineer las infecciones en el beb.  El beb tiene una menor incidencia de asma, alergias y del sndrome de muerte sbita del lactante.  Los nutrientes en la [place] materna son mejores para el beb que la [place] maternizada y estn preparados exclusivamente para cubrir las necesidades del beb.  La leche materna mejora el desarrollo cerebral del beb.  Es menos probable que el beb desarrolle otras enfermedades, como obesidad  infantil, asma o diabetes mellitus de tipo 2. Para usted   La lactancia materna favorece el desarrollo de un vnculo muy especial entre la madre y el beb.  Es conveniente. La leche materna siempre est disponible a la Tree surgeon y es [place].  La lactancia materna ayuda a quemar caloras y a perder el peso ganado durante el [place].  Favorece la contraccin del tero al tamao que tena antes del embarazo de manera ms rpida y disminuye el sangrado (loquios) despus del parto.  La lactancia materna contribuye a reducir Catering manager de desarrollar diabetes mellitus de tipo 2, osteoporosis o cncer de mama o de ovario en el futuro. SIGNOS DE QUE EL BEB EST HAMBRIENTO Primeros signos de hambre  Aumenta su estado de [place].  Se estira.  Mueve la cabeza de un lado a otro.  Mueve la cabeza y abre la boca cuando se le toca la mejilla o la comisura de la boca (reflejo de bsqueda).  [place] vocalizaciones, tales como sonidos de succin, se relame los labios, emite arrullos, suspiros, o chirridos.  Mueve la [employer] boca.  Se chupa con ganas los dedos o las manos. Signos tardos de [employer].  Llora de manera intermitente. Signos de [employer]oles[employer] signos de hambre extrema requerirn que lo calme y lo consuele antes de que el beb pueda alimentarse adecuadamente. No espere a que se manifiesten los siguientes signos de hambre extrema para comenzar a Economist:   Air cabin crew.  Llanto intenso y fuerte.   Gritos. INFORMACIN BSICA SOBRE LA LACTANCIA MATERNA Iniciacin de la lactancia materna  Encuentre un lugar cmodo para sentarse o acostarse, con un buen respaldo para el cuello y la espalda.  Coloque una almohada o una manta enrollada debajo del beb para acomodarlo a la altura de la mama (si est sentada). Las almohadas para Economist se han diseado especialmente a fin de servir de apoyo para los brazos y el beb Boeing.  Asegrese de que el abdomen del beb est frente al suyo.  Masajee suavemente la mama. Con las yemas de los dedos, masajee la pared del pecho hacia el pezn en un movimiento circular. Esto estimula el flujo de [place]. Es posible que Oceanographer este movimiento mientras amamanta si la leche fluye lentamente.  Sostenga la mama con el pulgar por arriba del pezn y los otros  4 dedos por debajo de la mama. Asegrese de que los dedos se encuentren lejos del pezn y de la boca del beb.  Empuje suavemente los labios del beb con el pezn o con el dedo.  Cuando la boca del beb se abra lo suficiente, acrquelo rpidamente a la mama e introduzca todo el pezn y la zona oscura que lo rodea (areola), tanto como sea posible, dentro de la boca del beb.  Debe haber ms areola visible por arriba del labio superior del beb que por debajo del labio inferior.  La lengua del beb debe estar entre la enca inferior y la [place].  Asegrese de que la boca del beb est en la posicin correcta alrededor del pezn (prendida). Los labios del beb deben crear un sello sobre la mama y estar doblados hacia afuera (invertidos).  Es comn que el beb succione durante 2 a 3 minutos para que comience el flujo de [place]. Cmo debe prenderse Es muy importante que le ensee al beb cmo prenderse adecuadamente a la mama. Si el beb no se prende adecuadamente, puede causarle dolor en el pezn y reducir la produccin de [place], y hacer que el beb tenga un escaso aumento de [place]. Adems, si el beb no se prende adecuadamente al pezn, puede tragar aire durante la alimentacin. Esto puede causarle molestias al beb. Hacer eructar al beb al Eliezer Lofts de mama puede ayudarlo a liberar el aire. Sin embargo, ensearle al beb cmo prenderse a la mama adecuadamente es la mejor manera de evitar que se sienta molesto por tragar Administrator, sports se alimenta. Signos de que el beb se ha prendido adecuadamente al pezn:    Hall Busing o succiona de modo silencioso, sin causarle dolor.  Se escucha que traga cada 3 o 4 succiones.   Hay movimientos musculares por arriba y por delante de sus odos al Mining engineer. Signos de que el beb no se ha prendido Product manager al pezn:   Hace ruidos de succin o de chasquido mientras se alimenta.  Siente dolor en el pezn. Si cree que el beb no se prendi correctamente, deslice el dedo en la comisura de la boca y Mic[employer] Technology las encas del beb para interrumpir la succin. Intente comenzar a amamantar nuevamente. Signos de Economist Signos del beb:   Disminuye gradualmente el nmero de succiones o cesa la succin por completo.  Se duerme.  Relaja el cuerpo.  Retiene una pequea cantidad de [employer]EL Corporation boca.  Se desprende solo del pecho. Signos que presenta usted:  Las mamas han aumentado la firmeza, el peso y el tamao 1 a 3 horas despus de Economist.  Estn ms blandas inmediatamente despus de amamantar.  Un aumento del volumen de [place], y tambin un cambio en su consistencia y color se producen hacia el [place] de Therapist, nutritional.  Los pezones no duelen, ni estn agrietados ni sangran. Signos de que su beb recibe la cantidad de leche suficiente  Moja al menos 3 paales en 24 horas. La orina debe ser clara y de color amarillo plido a los [place].  Defeca al menos 3 veces en 24 horas a los 5 das de vida. La materia fecal debe ser blanda y [place].  Defeca al menos 3 veces en 24 horas a los 7 das de vida. La materia fecal debe ser grumosa y [place].  No registra una prdida de peso mayor del 10% del peso al nacer durante los primeros [place].  Aumenta de peso un promedio de 4 a 7onzas (113 a 198g) por semana despus de los [place].  Aumenta de [place], [place], de [place] uniforme a Proofreader de los 5 das de vida, sin Museum/gallery curator prdida de peso despus de las 2semanas de vida. Despus de  alimentarse, es posible que el beb regurgite una pequea cantidad. Esto es frecuente. FRECUENCIA Y DURACIN DE LA LACTANCIA MATERNA El amamantamiento frecuente la ayudar a producir ms [place] y a Warden/ranger de Social research officer, government en los pezones e hinchazn en las [place]. Alimente al beb cuando muestre signos de hambre o si siente la necesidad de reducir la congestin de las [place]. Esto se denomina "lactancia a demanda". Evite el uso del chupete mientras trabaja para establecer la lactancia (las primeras 4 a 6 semanas despus del nacimiento del beb). Despus de este perodo, podr ofrecerle un chupete. Las investigaciones demostraron que el uso del chupete durante el primer ao de vida del beb disminuye el riesgo de desarrollar el sndrome de muerte sbita del lactante (SMSL). Permita que el nio se alimente en cada mama todo lo que desee. Contine amamantando al beb hasta que haya terminado de alimentarse. Cuando el beb se desprende o se queda dormido mientras se est alimentando de la primera mama, ofrzcale la segunda. Debido a que, con frecuencia, los recin [place] las primeras semanas de vida, es posible que deba despertar al beb para alimentarlo. Los horarios de Writer de un beb a otro. Sin embargo, las siguientes reglas pueden servir como gua para ayudarla a Engineer, materials que el beb se alimenta adecuadamente:  Se puede amamantar a los recin nacidos (bebs de 4 semanas o menos de vida) cada 1 a 3 horas.  No deben transcurrir ms de 3 horas durante el da o 5 horas durante la noche sin que se amamante a los recin nacidos.  Debe amamantar al beb 8 veces como mnimo en un perodo de 24 horas, hasta que comience a introducir slidos en su dieta, a los 6 meses de vida aproximadamente. [place] extraccin y Recruitment consultant de la leche materna le permiten asegurarse de que el beb se alimente exclusivamente de [place], aun en momentos en  los que no puede amamantar. Esto tiene especial importancia si debe regresar al [place] en el perodo en que an est amamantando o si no puede estar presente en los momentos en que el beb debe alimentarse. Su asesor en lactancia puede orientarla sobre cunto tiempo es [place].  El sacaleche es un aparato que le permite extraer leche de la mama a un recipiente estril. Luego, la leche materna extrada puede almacenarse en un refrigerador o Pension scheme manager. Algunos sacaleches son Theodore Demark, James Ivanoff otros son elctricos. Consulte a su asesor en lactancia qu tipo ser ms conveniente para usted. Los sacaleches se pueden comprar; sin embargo, algunos hospitales y grupos de apoyo a la lactancia materna alquilan Production assistant, radio. Un asesor en lactancia puede ensearle cmo extraer [employer]Max [employer], en caso de que prefiera no usar un sacaleche.  CMO CUIDAR LAS MAMAS DURANTE LA LACTANCIA MATERNA Los pezones se secan, agrietan y duelen durante la Therapist, nutritional. Las siguientes recomendaciones pueden ayudarla a Theatre manager las [employer] y sanas:  Art therapist usar jabn en los pezones.  Use un sostn de soporte. Aunque no son esenciales, las camisetas sin mangas o los sostenes especiales para Economist estn diseados para acceder fcilmente a las mamas, para Economist sin tener que quitarse todo  el sostn o la camiseta. Evite usar sostenes con aro o sostenes muy ajustados.  Seque al aire sus pezones durante 3 a 39minutos despus de amamantar al beb.  Utilice solo apsitos de Chiropodist sostn para Tax adviser las prdidas de [place]. La prdida de un poco de Owens [employer]ning tomas es normal.  Utilice lanolina sobre los pezones luego de Economist. La lanolina ayuda a mantener la humedad normal de la piel. Si [place] lanolina pura, no tiene que lavarse los pezones antes de volver a Research scientist (life sciences) al beb. La lanolina pura no es txica para el beb. Adems, puede extraer  Cisco algunas gotas de [place] materna y Community education officer suavemente esa [employer], para que la [place] se seque al aire. Durante las primeras semanas despus de dar a luz, algunas mujeres pueden experimentar hinchazn en las mamas (congestin [place]). La congestin puede hacer que sienta las mamas pesadas, calientes y sensibles al tacto. El pico de la congestin ocurre dentro de los 3 a 5 das despus del [place]. Las siguientes recomendaciones pueden ayudarla a Public house manager la congestin:  Vace por completo las mamas al [place]. Puede aplicar calor hmedo en las mamas (en la ducha o con toallas hmedas para manos) antes de Economist o extraer Northeast Utilities. Esto aumenta la circulacin y [place] a que la [place]. Si el beb no vaca por completo las mamas cuando lo amamanta, extraiga la [place] restante despus de que haya finalizado.  Use un sostn ajustado (para amamantar o comn) o una camiseta sin mangas durante 1 o 2 das para indicar al cuerpo que disminuya ligeramente la produccin de [place].  Aplique compresas de hielo [employer], a menos que le resulte demasiado incmodo.  Asegrese de que el beb est prendido y se encuentre en la posicin correcta mientras lo alimenta. Si la congestin persiste luego de 48 horas o despus de seguir estas recomendaciones, comunquese con su mdico o un Lobbyist. RECOMENDACIONES GENERALES PARA EL CUIDADO DE LA SALUD DURANTE LA LACTANCIA MATERNA  Consuma alimentos saludables. [place] comidas y colaciones, y coma 3 de cada una por da. Dado que lo que come Solectron Corporation, es posible que algunas comidas hagan que su beb se vuelva ms irritable de lo habitual. Evite comer este tipo de alimentos si percibe que afectan de manera negativa al beb.  Beba leche, jugos de fruta y agua para Engineer, water su sed (aproximadamente [place]).  Descanse con frecuencia, reljese y tome sus vitaminas prenatales para evitar la  fatiga, el estrs y la anemia.  Contine con los autocontroles de la mama.  Evite masticar y fumar tabaco.  Evite el consumo de alcohol y drogas. Algunos medicamentos, que pueden ser perjudiciales para el beb, pueden pasar a travs de la SLM Corporation. Es importante que consulte a su mdico antes de Medical sales representative, incluidos todos los medicamentos recetados y de [place], as como los suplementos vitamnicos y herbales. Puede quedar embarazada durante la lactancia. Si desea controlar la natalidad, consulte a su mdico cules son las opciones ms seguras para el beb. SOLICITE ATENCIN MDICA SI:   Usted siente que quiere dejar de Economist o se siente frustrada con la lactancia.  Siente dolor en las mamas o en los pezones.  Sus pezones estn agrietados o Control and instrumentation engineer.  Sus pechos estn irritados, sensibles o calientes.  Tiene un rea hinchada en cualquiera de las mamas.  Siente escalofros o fiebre.  Tiene nuseas o vmitos.  Presenta una secrecin de otro lquido distinto de la leche materna de los pezones.  Sus mamas no se llenan antes de Economist al beb para el quinto da despus del [place].  Se siente triste y deprimida.  El beb est demasiado somnoliento como para comer bien.  El beb tiene problemas para dormir.  Moja menos de 3 paales en 24 horas.  Defeca menos de 3 veces en 24 horas.  La piel del beb o la parte blanca de los ojos se vuelven amarillentas.  El beb no ha aumentado de [place] a los [place]. SOLICITE ATENCIN MDICA DE INMEDIATO SI:   El beb est muy cansado Engineer, manufacturing) y no se quiere despertar para comer.  Le sube la fiebre sin causa. Document Released: 09/14/2005 Document Revised: 09/19/2013 [facility] Patient Information 2015 [place]. This information is not intended to replace advice given to you by your health care provider. Make sure you discuss any questions you have with your health care provider. Segundo trimestre  de Media planner (Second Trimester of Pregnancy) El segundo trimestre va desde la semana13 hasta la 36, desde el cuarto hasta el sexto mes, y suele ser el momento en el que mejor se siente. Su organismo se ha adaptado a Public relations account executive y comienza a Print production planner. En general, las nuseas matutinas han disminuido o han desaparecido completamente, p El segundo trimestre es tambin la poca en la que el feto se desarrolla rpidamente. Hacia el final del sexto mes, el feto mide aproximadamente 9pulgadas (23cm) y pesa alrededor de 1 libras (700g). Es probable que sienta que el beb se Software engineer (da pataditas) entre las 76 y 20semanas del Media planner. CAMBIOS EN EL ORGANISMO Su organismo atraviesa por muchos cambios durante el [place], y estos varan de Ardelia Mems mujer a Theatre manager.   Seguir [employer]. Notar que la parte baja del abdomen sobresale.  Podrn aparecer las primeras [employer] caderas, el abdomen y las [place].  Es posible que tenga dolores de cabeza que pueden aliviarse con los medicamentos que su mdico autorice.  Tal vez tenga necesidad de orinar con ms frecuencia porque el feto est ejerciendo presin Field seismologist.  Debido al Glennis Brink podr sentir Victorio Palm estomacal con frecuencia.  Puede estar estreida, ya que ciertas hormonas enlentecen los movimientos de los msculos que [employer] desechos a travs de los intestinos.  Pueden aparecer hemorroides o abultarse e hincharse las venas (venas varicosas).  Puede tener dolor de espalda que se debe al [employer] Company de peso y a que las hormonas del Scientist, research (life sciences) las articulaciones entre los huesos de la pelvis, y Civil Service fast streamer consecuencia de la modificacin del peso y los msculos que mantienen el equilibrio.  Las [employer] seguirn creciendo y Teaching laboratory technician.  Las Production manager y estar sensibles al cepillado y al hilo dental.  Pueden aparecer zonas oscuras o manchas (cloasma, mscara del Media planner) en el rostro que probablemente se  atenuarn despus del nacimiento del beb.  Es posible que se forme una lnea oscura desde el ombligo hasta la zona del pubis (linea nigra) que probablemente se atenuarn despus del nacimiento del beb.  Tal vez haya cambios en el cabello que pueden incluir su engrosamiento, crecimiento rpido y cambios en la textura. Adems, a algunas mujeres se les cae el cabello durante o despus del embarazo, o tienen el cabello seco o fino. Lo ms probable es que el cabello se le normalice despus del nacimiento del beb. QU DEBE ESPERAR EN LAS CONSULTAS PRENATALES Durante una  visita prenatal de rutina:  La pesarn para asegurarse de que usted y el feto estn creciendo normalmente.  Le tomarn la presin arterial.  Le medirn el abdomen para controlar el desarrollo del beb.  Se escucharn los latidos cardacos fetales.  Se evaluarn los resultados de los estudios solicitados en visitas anteriores. El mdico puede preguntarle lo siguiente:  Cmo se siente.  Si siente los movimientos del beb.  Si ha tenido sntomas anormales, como prdida de lquido, [place], dolores de cabeza intensos o clicos abdominales.  Si tiene [employer]. Otros estudios que podrn realizarse durante el segundo trimestre incluyen lo siguiente:  Anlisis de sangre para detectar:  Concentraciones de hierro bajas (anemia).  Diabetes gestacional (entre la semana 24 y la 30).  Anticuerpos Rh.  Anlisis de orina para detectar infecciones, diabetes o protenas en la orina.  Una ecografa para confirmar que el beb crece y se desarrolla correctamente.  Una amniocentesis para diagnosticar posibles problemas genticos.  Estudios del feto para descartar espina bfida y sndrome de [place]. INSTRUCCIONES PARA EL CUIDADO EN EL HOGAR   Evite fumar, consumir hierbas, beber alcohol y tomar frmacos que no le hayan recetado. Estas sustancias qumicas afectan la formacin y el desarrollo del beb.  [place] mdico en relacin con el uso de medicamentos. Durante el embarazo, hay medicamentos que son seguros de tomar y otros que no.  Haga actividad fsica solo en la forma indicada por el mdico. Sentir clicos uterinos es un buen signo para Ambulance person actividad fsica.  Contine comiendo alimentos que sanos con regularidad.  Use un sostn que le brinde buen soporte si le [employer].  No se d baos de inmersin en agua caliente, baos turcos ni saunas.  Colquese el cinturn de seguridad cuando conduzca.  No coma carne cruda ni queso sin cocinar; evite el contacto con las bandejas sanitarias de los gatos y la tierra que estos animales usan. Estos elementos contienen grmenes que pueden causar defectos congnitos en el beb.  [place].  Si est estreida, pruebe un laxante suave (si el mdico lo autoriza). Consuma ms alimentos ricos en fibra, como vegetales y frutas frescos y Psychologist, prison and probation services. Beba gran cantidad de lquido para mantener la orina de tono claro o color amarillo plido.  Dese baos de asiento con agua tibia para Best boy o las molestias causadas por las hemorroides. Use una crema para las hemorroides si el mdico la autoriza.  Si tiene venas varicosas, use medias de descanso. Eleve los pies durante 61minutos, 3 o 4veces por da. Limite la cantidad de sal en su dieta.  No levante objetos pesados, use zapatos de tacones bajos y mantenga una buena postura.  Descanse con las piernas elevadas si tiene calambres o dolor de cintura.  Visite a su dentista si an no lo ha [place] manager. Use un cepillo de dientes blando para higienizarse los dientes y psese el hilo dental con suavidad.  Puede seguir [employer], a menos que el mdico le indique lo contrario.  Concurra a todas las visitas prenatales segn las indicaciones de su mdico. SOLICITE ATENCIN MDICA SI:   Natale Milch.  Siente clicos leves,  presin en la pelvis o dolor persistente en el abdomen.  Tiene nuseas, vmitos o diarrea persistentes.  Tiene secrecin vaginal con mal olor.  Siente dolor al [employer]. SOLICITE ATENCIN MDICA DE INMEDIATO SI:   Tiene fiebre.  Tiene una prdida de lquido por la vagina.  Tiene  sangrado o pequeas prdidas vaginales.  Siente dolor intenso o clicos en el abdomen.  Sube o baja de peso rpidamente.  Tiene dificultad para respirar y siente dolor de pecho.  Sbitamente se le hinchan mucho el rostro, las [place], los tobillos, los pies o las piernas.  No ha sentido los movimientos del beb durante Leone Brand.  Siente un dolor de cabeza intenso que no se alivia con medicamentos.  Hay cambios en la visin. Document Released: 06/24/2005 Document Revised: 09/19/2013 [employer] Patient Information 2015 [place]. This information is not intended to replace advice given to you by your health care provider. Make sure you discuss any questions you have with your health care provider.

## 2014-07-02 NOTE — Progress Notes (Signed)
Ultrasound scheduled with MFM 10/26 optho appt scheduled 10/29 @ 8:15 with koala eye

## 2014-07-03 ENCOUNTER — Encounter: Payer: Self-pay | Admitting: Family Medicine

## 2014-07-09 LAB — COMPREHENSIVE METABOLIC PANEL
ALK PHOS: 46 U/L (ref 39–117)
ALT: 15 U/L (ref 0–35)
AST: 16 U/L (ref 0–37)
Albumin: 3.5 g/dL (ref 3.5–5.2)
BILIRUBIN TOTAL: 0.4 mg/dL (ref 0.2–1.2)
BUN: 7 mg/dL (ref 6–23)
CHLORIDE: 106 meq/L (ref 96–112)
CO2: 22 mEq/L (ref 19–32)
Calcium: 8.8 mg/dL (ref 8.4–10.5)
Creat: 0.51 mg/dL (ref 0.50–1.10)
Glucose, Bld: 89 mg/dL (ref 70–99)
Potassium: 3.8 mEq/L (ref 3.5–5.3)
Sodium: 136 mEq/L (ref 135–145)
Total Protein: 5.9 g/dL — ABNORMAL LOW (ref 6.0–8.3)

## 2014-07-11 LAB — CREATININE CLEARANCE, URINE, 24 HOUR
CREAT CLEAR: 180 mL/min — AB (ref 75–115)
CREATININE 24H UR: 1319 mg/d (ref 700–1800)
Creatinine, Urine: 24.2 mg/dL
Creatinine: 0.51 mg/dL (ref 0.50–1.10)

## 2014-07-11 LAB — PROTEIN, URINE, 24 HOUR: Protein, Urine: <4 mg/dL — ABNORMAL LOW (ref 5–24)

## 2014-07-16 ENCOUNTER — Ambulatory Visit (INDEPENDENT_AMBULATORY_CARE_PROVIDER_SITE_OTHER): Payer: Self-pay | Admitting: Obstetrics and Gynecology

## 2014-07-16 ENCOUNTER — Encounter: Payer: Self-pay | Admitting: Obstetrics and Gynecology

## 2014-07-16 VITALS — BP 111/64 | HR 70 | Temp 97.8°F | Wt 152.5 lb

## 2014-07-16 DIAGNOSIS — O09293 Supervision of pregnancy with other poor reproductive or obstetric history, third trimester: Secondary | ICD-10-CM

## 2014-07-16 DIAGNOSIS — O0992 Supervision of high risk pregnancy, unspecified, second trimester: Secondary | ICD-10-CM

## 2014-07-16 DIAGNOSIS — O24312 Unspecified pre-existing diabetes mellitus in pregnancy, second trimester: Secondary | ICD-10-CM

## 2014-07-16 DIAGNOSIS — O09292 Supervision of pregnancy with other poor reproductive or obstetric history, second trimester: Secondary | ICD-10-CM

## 2014-07-16 DIAGNOSIS — O24912 Unspecified diabetes mellitus in pregnancy, second trimester: Secondary | ICD-10-CM

## 2014-07-16 DIAGNOSIS — Z8632 Personal history of gestational diabetes: Secondary | ICD-10-CM

## 2014-07-16 LAB — POCT URINALYSIS DIP (DEVICE)
BILIRUBIN URINE: NEGATIVE
Glucose, UA: NEGATIVE mg/dL
HGB URINE DIPSTICK: NEGATIVE
Ketones, ur: NEGATIVE mg/dL
LEUKOCYTES UA: NEGATIVE
NITRITE: NEGATIVE
PH: 6.5 (ref 5.0–8.0)
Protein, ur: NEGATIVE mg/dL
Specific Gravity, Urine: <=1.005 (ref 1.005–1.030)
Urobilinogen, UA: 0.2 mg/dL (ref 0.0–1.0)

## 2014-07-16 MED ORDER — GLYBURIDE 2.5 MG PO TABS: 2.5000 mg | ORAL_TABLET | Freq: Every day | ORAL | Status: DC

## 2014-07-16 NOTE — Progress Notes (Signed)
Nutrition note:  Pt had a question about bedtime snack - pt reports that she's been having some elevated fasting BS in the morning (98-120). Pt reports having a glass of milk & piece of whole wheat bread at night & then will have a high fasting value but when she has greek yogurt (19g of CHO per pt) @ night, her BS are wnl in the morning. Pt reports eating dinner ~4 or 5pm. Per pt & MD notes, pt is to start glyburide at bedtime. Encouraged pt to eat ~15g of CHO with protein at night. Also discussed how to treat hypoglycemia (<60) since she'll be starting meds. Pt agreeable. F/u as needed Vladimir Faster, MS, RD, LDN, [facility]

## 2014-07-16 NOTE — Progress Notes (Signed)
Zenda Alpers used as interpreter for this encounter.

## 2014-07-16 NOTE — Progress Notes (Signed)
Patient is doing well without complaints. Patient is scheduled for anatomy ultrasound next week. She is also scheduled for eye exam next week. CBG reviewed- all pp within range. Fasting 98-120. Will start glyburide 2.5 mg at bedtime.

## 2014-07-23 ENCOUNTER — Encounter (HOSPITAL_COMMUNITY): Payer: Self-pay

## 2014-07-23 ENCOUNTER — Ambulatory Visit (HOSPITAL_COMMUNITY)
Admission: RE | Admit: 2014-07-23 | Discharge: 2014-07-23 | Disposition: A | Discharge status: Home / Self Care | Payer: Self-pay | Source: Ambulatory Visit | Attending: Family Medicine | Admitting: Family Medicine

## 2014-07-23 VITALS — BP 105/60 | HR 64 | Wt 151.5 lb

## 2014-07-23 DIAGNOSIS — E119 Type 2 diabetes mellitus without complications: Secondary | ICD-10-CM | POA: Insufficient documentation

## 2014-07-23 DIAGNOSIS — O24312 Unspecified pre-existing diabetes mellitus in pregnancy, second trimester: Secondary | ICD-10-CM

## 2014-07-23 DIAGNOSIS — Z3A18 18 weeks gestation of pregnancy: Secondary | ICD-10-CM | POA: Insufficient documentation

## 2014-07-23 DIAGNOSIS — O24112 Pre-existing diabetes mellitus, type 2, in pregnancy, second trimester: Secondary | ICD-10-CM | POA: Insufficient documentation

## 2014-07-24 ENCOUNTER — Encounter: Payer: Self-pay | Admitting: Family Medicine

## 2014-07-30 ENCOUNTER — Encounter (HOSPITAL_COMMUNITY): Payer: Self-pay

## 2014-07-30 ENCOUNTER — Ambulatory Visit (INDEPENDENT_AMBULATORY_CARE_PROVIDER_SITE_OTHER): Payer: Self-pay | Admitting: Obstetrics & Gynecology

## 2014-07-30 VITALS — BP 103/60 | HR 71 | Temp 98.4°F | Wt 153.3 lb

## 2014-07-30 DIAGNOSIS — O0992 Supervision of high risk pregnancy, unspecified, second trimester: Secondary | ICD-10-CM

## 2014-07-30 DIAGNOSIS — O24312 Unspecified pre-existing diabetes mellitus in pregnancy, second trimester: Secondary | ICD-10-CM

## 2014-07-30 DIAGNOSIS — O24912 Unspecified diabetes mellitus in pregnancy, second trimester: Secondary | ICD-10-CM

## 2014-07-30 LAB — POCT URINALYSIS DIP (DEVICE)
Bilirubin Urine: NEGATIVE
Glucose, UA: NEGATIVE mg/dL
Hgb urine dipstick: NEGATIVE
Ketones, ur: NEGATIVE mg/dL
LEUKOCYTES UA: NEGATIVE
Nitrite: NEGATIVE
PH: 6.5 (ref 5.0–8.0)
PROTEIN: NEGATIVE mg/dL
Specific Gravity, Urine: 1.025 (ref 1.005–1.030)
Urobilinogen, UA: 0.2 mg/dL (ref 0.0–1.0)

## 2014-07-30 NOTE — Patient Instructions (Signed)
Regrese a la clinica cuando tenga su cita. Si tiene problemas o preguntas, llama a la clinica o vaya a la sala de emergencia al [facility].    

## 2014-07-30 NOTE — Progress Notes (Signed)
  Patient was seen on 07/30/14 for Gestational Diabetes self-management class [facility]. The following learning objectives were met by the patient during this course:   States the definition of Gestational Diabetes  States when to check blood glucose levels  Demonstrates proper blood glucose monitoring techniques  Blood glucose monitor given: True Track Blood glucose reading: within normal target range  Patient instructed to monitor glucose levels: FBS: 60 - <90 2 hour: <120  *Patient received handouts:  Nutrition Diabetes and Pregnancy  Carbohydrate Counting List  Patient will be seen for follow-up as needed.

## 2014-07-30 NOTE — Progress Notes (Signed)
No complaints today.

## 2014-07-30 NOTE — Progress Notes (Signed)
Patient is Spanish-speaking only, Spanish interpreter present for this encounter. Normal blood sugar except one abnormal PP dinner of 178 - continue Glyburide 25 mg po qhs Scheduled for anatomy scan; will schedule fetal ECHO. Already did Optho exam at [facility] on 07/26/14 -> normal as per patient No other complaints or concerns.  Routine obstetric precautions reviewed.

## 2014-07-30 NOTE — Progress Notes (Signed)
Fetal Echo with Dr. Filbert Schilder 08/21/14 @ 1p.  Pt informed $100.00 deposit due at time of appt, will receive bill for remaining fees, aware to call #[phone] or [phone] for financial assistance.  Pt declined interpreter, husband present, both verbalized understanding.

## 2014-08-13 ENCOUNTER — Ambulatory Visit (INDEPENDENT_AMBULATORY_CARE_PROVIDER_SITE_OTHER): Payer: Self-pay | Admitting: Family Medicine

## 2014-08-13 VITALS — BP 108/64 | HR 63 | Wt 154.4 lb

## 2014-08-13 DIAGNOSIS — O0992 Supervision of high risk pregnancy, unspecified, second trimester: Secondary | ICD-10-CM

## 2014-08-13 DIAGNOSIS — O24912 Unspecified diabetes mellitus in pregnancy, second trimester: Secondary | ICD-10-CM

## 2014-08-13 DIAGNOSIS — O24312 Unspecified pre-existing diabetes mellitus in pregnancy, second trimester: Secondary | ICD-10-CM

## 2014-08-13 LAB — POCT URINALYSIS DIP (DEVICE)
Bilirubin Urine: NEGATIVE
GLUCOSE, UA: NEGATIVE mg/dL
HGB URINE DIPSTICK: NEGATIVE
Ketones, ur: NEGATIVE mg/dL
Leukocytes, UA: NEGATIVE
NITRITE: NEGATIVE
Protein, ur: NEGATIVE mg/dL
Specific Gravity, Urine: 1.01 (ref 1.005–1.030)
UROBILINOGEN UA: 0.2 mg/dL (ref 0.0–1.0)
pH: 6.5 (ref 5.0–8.0)

## 2014-08-13 NOTE — Patient Instructions (Signed)
Segundo trimestre del embarazo  (Second Trimester of Pregnancy)  El segundo trimestre del embarazo se extiende desde la semana 13 hasta la semana 28, del 4 al 6 mes. En general, es el momento del embarazo en el que se sentir mejor. En general, las nuseas matutinas disminuyen o desaparecen. Tendr ms energa y podr aumentarle el apetito. El beb por nacer (feto) se desarrolla rpidamente. Hacia el final del sexto mes, el beb mide aproximadamente 9 pulgadas (23 cm) y pesa alrededor de 1 libras (700 g). Es probable que sienta mover al beb (dar pataditas) entre las 18 y 20 semanas del embarazo. CUIDADOS EN EL HOGAR   Evite fumar, consumir hierbas y beber alcohol. Evite los frmacos que no apruebe el mdico.  Slo tome los medicamentos que le haya indicado su mdico. Algunos medicamentos son seguros para tomar durante el embarazo y otros no lo son.  Haga ejercicios slo como le indique el mdico. Deje de hacer ejercicios si comienza a tener clicos.  Haga comidas regulares y sanas.  Use un sostn que le brinde buen soporte si sus mamas estn sensibles.  No utilice la baera con agua caliente, baos turcos y saunas.  Colquese el cinturn de seguridad cuando conduzca.  Evite comer carne cruda y el contacto con los utensilios y desperdicios de los gatos.  Tome las vitaminas indicadas para la etapa prenatal.  Trate de tomar medicamentos para mover el intestino (laxantes) segn lo necesario y si su mdico la autoriza. Consuma ms fibra comiendo frutas y vegetales frescos y granos enteros. Beba gran cantidad de lquido para mantener el pis (orina) de tono claro o amarillo plido.  Tome baos de agua tibia (baos de asiento) para calmar el dolor o las molestias causadas por las hemorroides. Use una crema para las hemorroides si el mdico la autoriza.  Si tiene venas hinchadas y abultadas (venas varicosas), use medias de soporte. Eleve (levante) los pies durante 15 minutos, 3 o 4 veces por  da. Limite el consumo de sal en su dieta.  Evite levantar objetos pesados, usar tacones altos y sintese derecha.  Descanse con las piernas elevadas si tiene calambres o dolor de cintura.  Visite a su dentista si no lo ha hecho durante el embarazo. Use un cepillo de dientes blando para higienizarse los dientes. Use suavemente el hilo dental.  Puede tener sexo (relaciones sexuales) siempre que el mdico la autorice.  Concurra a los controles mdicos. SOLICITE AYUDA SI:   Siente mareos.  Siente clicos intensos en el estmago, en la espalda o en el vientre (abdomen).  Siente un dolor persistente en la zona del vientre.  Tiene malestar estomacal (nuseas), devuelve (vomita), o tiene deposiciones acuosas (diarrea).  Advierte un olor ftido que proviene de la vagina.  Siente dolor al hacer pis (orinar). SOLICITE AYUDA DE INMEDIATO SI:   Tiene fiebre.  Pierde lquido por la vagina.  Tiene sangrando o pequeas prdidas vaginales.  Siente dolor intenso o clicos en el abdomen.  Sube o baja de peso rpidamente.  Tiene dificultad para respirar o siente dolor en el pecho.  Sbitamente se le hinchan el rostro, las manos, los tobillos, los pies o las piernas.  No ha sentido los movimientos del beb durante una hora.  Siente un dolor de cabeza intenso que no se alivia con medicamentos.  Su visin se modifica. Document Released: 05/17/2013 ExitCare Patient Information 2015 [employer]. This information is not intended to replace advice given to you by your health care provider. Make sure you   discuss any questions you have with your health care provider.  

## 2014-08-13 NOTE — Progress Notes (Signed)
Fasting BS - 3>90 2hr PP - 9:56 > 120. Had 2 low blood sugars in the middle of the night - symptomatic with cold sweats and feeling shaky. No other complaints.

## 2014-08-22 ENCOUNTER — Encounter: Payer: Self-pay | Admitting: Obstetrics & Gynecology

## 2014-08-28 ENCOUNTER — Encounter: Payer: Self-pay | Admitting: Obstetrics & Gynecology

## 2014-09-03 ENCOUNTER — Ambulatory Visit (HOSPITAL_COMMUNITY)
Admission: RE | Admit: 2014-09-03 | Discharge: 2014-09-03 | Disposition: A | Discharge status: Home / Self Care | Payer: Self-pay | Source: Ambulatory Visit | Attending: Family Medicine | Admitting: Family Medicine

## 2014-09-03 ENCOUNTER — Encounter (HOSPITAL_COMMUNITY): Payer: Self-pay

## 2014-09-03 ENCOUNTER — Ambulatory Visit (INDEPENDENT_AMBULATORY_CARE_PROVIDER_SITE_OTHER): Payer: Self-pay | Admitting: Obstetrics and Gynecology

## 2014-09-03 ENCOUNTER — Other Ambulatory Visit (HOSPITAL_COMMUNITY): Payer: Self-pay | Admitting: Maternal and Fetal Medicine

## 2014-09-03 ENCOUNTER — Encounter: Payer: Self-pay | Admitting: Obstetrics and Gynecology

## 2014-09-03 VITALS — BP 116/73 | HR 74 | Wt 157.9 lb

## 2014-09-03 DIAGNOSIS — O24312 Unspecified pre-existing diabetes mellitus in pregnancy, second trimester: Secondary | ICD-10-CM

## 2014-09-03 DIAGNOSIS — O24112 Pre-existing diabetes mellitus, type 2, in pregnancy, second trimester: Secondary | ICD-10-CM | POA: Insufficient documentation

## 2014-09-03 DIAGNOSIS — O24912 Unspecified diabetes mellitus in pregnancy, second trimester: Secondary | ICD-10-CM

## 2014-09-03 DIAGNOSIS — Z3A24 24 weeks gestation of pregnancy: Secondary | ICD-10-CM | POA: Insufficient documentation

## 2014-09-03 DIAGNOSIS — Z8632 Personal history of gestational diabetes: Secondary | ICD-10-CM

## 2014-09-03 DIAGNOSIS — O0993 Supervision of high risk pregnancy, unspecified, third trimester: Secondary | ICD-10-CM

## 2014-09-03 DIAGNOSIS — O09292 Supervision of pregnancy with other poor reproductive or obstetric history, second trimester: Secondary | ICD-10-CM

## 2014-09-03 DIAGNOSIS — E119 Type 2 diabetes mellitus without complications: Secondary | ICD-10-CM | POA: Insufficient documentation

## 2014-09-03 DIAGNOSIS — O09293 Supervision of pregnancy with other poor reproductive or obstetric history, third trimester: Secondary | ICD-10-CM

## 2014-09-03 LAB — POCT URINALYSIS DIP (DEVICE)
BILIRUBIN URINE: NEGATIVE
GLUCOSE, UA: NEGATIVE mg/dL
HGB URINE DIPSTICK: NEGATIVE
Ketones, ur: NEGATIVE mg/dL
Leukocytes, UA: NEGATIVE
Nitrite: NEGATIVE
Protein, ur: NEGATIVE mg/dL
Urobilinogen, UA: 0.2 mg/dL (ref 0.0–1.0)
pH: 5 (ref 5.0–8.0)

## 2014-09-03 NOTE — Progress Notes (Signed)
Interupter: Lockie Mola

## 2014-09-03 NOTE — Progress Notes (Signed)
Lynn Burnett  was seen today for an ultrasound appointment.  See scanned report in Media tab of EPIC.  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 Antenatal testing beginning at 32 weeks.  Benjaman Lobe, MD

## 2014-09-03 NOTE — Progress Notes (Signed)
Patient is doing well without complaints. FM/PTL precautions reviewed. Growth ultrasound today. CBGs reviewed and majority within range- continue current glyburide regimen Patient reports normal fetal echo

## 2014-09-17 ENCOUNTER — Ambulatory Visit (INDEPENDENT_AMBULATORY_CARE_PROVIDER_SITE_OTHER): Payer: Self-pay | Admitting: Obstetrics and Gynecology

## 2014-09-17 VITALS — BP 111/64 | HR 73 | Wt 159.5 lb

## 2014-09-17 DIAGNOSIS — O0992 Supervision of high risk pregnancy, unspecified, second trimester: Secondary | ICD-10-CM

## 2014-09-17 DIAGNOSIS — O24912 Unspecified diabetes mellitus in pregnancy, second trimester: Secondary | ICD-10-CM

## 2014-09-17 DIAGNOSIS — O24312 Unspecified pre-existing diabetes mellitus in pregnancy, second trimester: Secondary | ICD-10-CM

## 2014-09-17 DIAGNOSIS — O09292 Supervision of pregnancy with other poor reproductive or obstetric history, second trimester: Secondary | ICD-10-CM

## 2014-09-17 LAB — POCT URINALYSIS DIP (DEVICE)
BILIRUBIN URINE: NEGATIVE
Glucose, UA: 100 mg/dL — AB
HGB URINE DIPSTICK: NEGATIVE
KETONES UR: NEGATIVE mg/dL
Leukocytes, UA: NEGATIVE
Nitrite: NEGATIVE
Protein, ur: NEGATIVE mg/dL
Specific Gravity, Urine: <=1.005 (ref 1.005–1.030)
Urobilinogen, UA: 0.2 mg/dL (ref 0.0–1.0)
pH: 5.5 (ref 5.0–8.0)

## 2014-09-17 NOTE — Progress Notes (Signed)
Pt reports nausea and some diarrhea this morning, thinks she ate something bad last night.

## 2014-09-17 NOTE — Progress Notes (Signed)
Reports nausea and one episode of diarrhea this morning. Thinks it's food related. Otherwise doing well. 1. GDM. Blood sugars reviewed and mostly within range. 1 fasting > 90. 3 postprandial > 120 in the last 2 weeks. Counseled on diet/exercise. Continue glyburide 2.5 mg qhs. Growth scan at [redacted]w[redacted]d EFW 742 gm (53%ile). Fetal Echo completed and reportedly normal.  2. Routine PNC. Labs reviewed. FM/PTL precautions reviewed.

## 2014-09-28 NOTE — L&D Delivery Note (Signed)
Operative Delivery Note  Patient is 34 y.o. 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   At 8:56 AM a viable female was delivered via Vaginal, Vacuum Neurosurgeon).  Presentation: vertex; Position: Occiput,; Station: +2, +3 when pushed.  Variable decels down 70s with each contraction/expultatory effort, decision made to vacuum after each decel became progressively longer lasting up to 2 minutes in 70s.  Verbal consent: obtained from patient.  Risks and benefits discussed in detail.  Risks include, but are not limited to the risks of anesthesia, bleeding, infection, damage to maternal tissues, fetal cephalhematoma.  There is also the risk of inability to effect vaginal delivery of the head, or shoulder dystocia that cannot be resolved by established maneuvers, leading to the need for emergency cesarean section.  APGAR: , 9; weight  .   Placenta status: Intact, Spontaneous.   Cord: 3 vessels with the following complications: None  Anesthesia: Epidural  Instruments: Kiwi Vacuum Episiotomy: None Lacerations: 2nd degree;Perineal Suture Repair: 3.0 monocryl Est. Blood Loss (mL): 400  Mom to postpartum.  Baby to Couplet care / Skin to Skin.  Lynn Burnett ROCIO 12/11/2014, 10:12 AM

## 2014-10-01 ENCOUNTER — Other Ambulatory Visit (HOSPITAL_COMMUNITY): Payer: Self-pay | Admitting: Maternal and Fetal Medicine

## 2014-10-01 ENCOUNTER — Encounter (HOSPITAL_COMMUNITY): Payer: Self-pay

## 2014-10-01 ENCOUNTER — Ambulatory Visit (HOSPITAL_COMMUNITY)
Admission: RE | Admit: 2014-10-01 | Discharge: 2014-10-01 | Disposition: A | Discharge status: Home / Self Care | Payer: Self-pay | Source: Ambulatory Visit | Attending: Family Medicine | Admitting: Family Medicine

## 2014-10-01 ENCOUNTER — Ambulatory Visit (INDEPENDENT_AMBULATORY_CARE_PROVIDER_SITE_OTHER): Payer: Self-pay | Admitting: Obstetrics and Gynecology

## 2014-10-01 VITALS — BP 110/66 | HR 74 | Temp 98.3°F | Wt 161.0 lb

## 2014-10-01 DIAGNOSIS — O24419 Gestational diabetes mellitus in pregnancy, unspecified control: Secondary | ICD-10-CM

## 2014-10-01 DIAGNOSIS — O24312 Unspecified pre-existing diabetes mellitus in pregnancy, second trimester: Secondary | ICD-10-CM

## 2014-10-01 DIAGNOSIS — O09523 Supervision of elderly multigravida, third trimester: Secondary | ICD-10-CM | POA: Insufficient documentation

## 2014-10-01 DIAGNOSIS — O24313 Unspecified pre-existing diabetes mellitus in pregnancy, third trimester: Secondary | ICD-10-CM

## 2014-10-01 DIAGNOSIS — E119 Type 2 diabetes mellitus without complications: Secondary | ICD-10-CM | POA: Insufficient documentation

## 2014-10-01 DIAGNOSIS — O24113 Pre-existing diabetes mellitus, type 2, in pregnancy, third trimester: Secondary | ICD-10-CM | POA: Insufficient documentation

## 2014-10-01 DIAGNOSIS — Z3A28 28 weeks gestation of pregnancy: Secondary | ICD-10-CM | POA: Insufficient documentation

## 2014-10-01 LAB — POCT URINALYSIS DIP (DEVICE)
Bilirubin Urine: NEGATIVE
Glucose, UA: 100 mg/dL — AB
Ketones, ur: NEGATIVE mg/dL
LEUKOCYTES UA: NEGATIVE
Nitrite: NEGATIVE
PH: 6.5 (ref 5.0–8.0)
PROTEIN: NEGATIVE mg/dL
Specific Gravity, Urine: 1.015 (ref 1.005–1.030)
Urobilinogen, UA: 0.2 mg/dL (ref 0.0–1.0)

## 2014-10-01 NOTE — Progress Notes (Signed)
Reports nausea and one episode of diarrhea this morning. Thinks it's food related. Otherwise doing well.  1. GDM. Blood sugars reviewed and mostly within range. 1 fasting > 90. 3 postprandial > 120 in the last 2 weeks. Counseled on diet/exercise. Continue glyburide 2.5 mg qhs. Growth scan at [redacted]w[redacted]d EFW 742 gm (53%ile). Fetal Echo completed and reportedly normal.  -- did have several fastings in 50s, several pp in 70s, denies feeling bad -- does have growth scan scheduled for this PM  2. Routine PNC. Labs reviewed. FM/PTL precautions reviewed. Tdap next visit

## 2014-10-15 ENCOUNTER — Encounter: Payer: Self-pay | Admitting: Obstetrics and Gynecology

## 2014-10-15 ENCOUNTER — Ambulatory Visit (INDEPENDENT_AMBULATORY_CARE_PROVIDER_SITE_OTHER): Payer: Self-pay | Admitting: Obstetrics and Gynecology

## 2014-10-15 VITALS — BP 106/61 | HR 82 | Temp 98.0°F | Wt 165.4 lb

## 2014-10-15 DIAGNOSIS — O09293 Supervision of pregnancy with other poor reproductive or obstetric history, third trimester: Secondary | ICD-10-CM

## 2014-10-15 DIAGNOSIS — Z23 Encounter for immunization: Secondary | ICD-10-CM

## 2014-10-15 DIAGNOSIS — O0993 Supervision of high risk pregnancy, unspecified, third trimester: Secondary | ICD-10-CM

## 2014-10-15 DIAGNOSIS — O24313 Unspecified pre-existing diabetes mellitus in pregnancy, third trimester: Secondary | ICD-10-CM

## 2014-10-15 DIAGNOSIS — Z8632 Personal history of gestational diabetes: Principal | ICD-10-CM

## 2014-10-15 DIAGNOSIS — O24913 Unspecified diabetes mellitus in pregnancy, third trimester: Secondary | ICD-10-CM

## 2014-10-15 LAB — HIV ANTIBODY (ROUTINE TESTING W REFLEX): HIV 1&2 Ab, 4th Generation: NONREACTIVE

## 2014-10-15 LAB — CBC
HCT: 36.8 % (ref 36.0–46.0)
Hemoglobin: 12.9 g/dL (ref 12.0–15.0)
MCH: 34.4 pg — ABNORMAL HIGH (ref 26.0–34.0)
MCHC: 35.1 g/dL (ref 30.0–36.0)
MCV: 98.1 fL (ref 78.0–100.0)
MPV: 9.3 fL (ref 8.6–12.4)
PLATELETS: 164 10*3/uL (ref 150–400)
RBC: 3.75 MIL/uL — ABNORMAL LOW (ref 3.87–5.11)
RDW: 14 % (ref 11.5–15.5)
WBC: 6.9 10*3/uL (ref 4.0–10.5)

## 2014-10-15 LAB — RPR

## 2014-10-15 MED ORDER — TETANUS-DIPHTH-ACELL PERTUSSIS 5-2.5-18.5 LF-MCG/0.5 IM SUSP
0.5000 mL | Freq: Once | INTRAMUSCULAR | Status: AC
  Administered 2014-10-15: 0.5 mL via INTRAMUSCULAR

## 2014-10-15 NOTE — Progress Notes (Signed)
Patient is doing well without complaints. FM/PTL precautions reviewed. Size greater than dates- f/u growth ultrasound on 10/29/2014.  CBGs reviewed and greater majority within range, continue current glyburide regimen Will start twice weekly fetal testing next visit Tdap and labs today

## 2014-10-15 NOTE — Progress Notes (Signed)
CBC, HIV, RPR and tdap today.

## 2014-10-29 ENCOUNTER — Other Ambulatory Visit (HOSPITAL_COMMUNITY): Payer: Self-pay | Admitting: Maternal and Fetal Medicine

## 2014-10-29 ENCOUNTER — Ambulatory Visit (INDEPENDENT_AMBULATORY_CARE_PROVIDER_SITE_OTHER): Payer: Self-pay | Admitting: Obstetrics & Gynecology

## 2014-10-29 ENCOUNTER — Other Ambulatory Visit (HOSPITAL_COMMUNITY): Payer: Self-pay

## 2014-10-29 ENCOUNTER — Ambulatory Visit (HOSPITAL_COMMUNITY)
Admission: RE | Admit: 2014-10-29 | Discharge: 2014-10-29 | Disposition: A | Discharge status: Home / Self Care | Payer: Self-pay | Source: Ambulatory Visit | Attending: Family Medicine | Admitting: Family Medicine

## 2014-10-29 ENCOUNTER — Encounter (HOSPITAL_COMMUNITY): Payer: Self-pay

## 2014-10-29 VITALS — BP 101/63 | HR 78 | Temp 97.5°F | Wt 169.2 lb

## 2014-10-29 DIAGNOSIS — O24113 Pre-existing diabetes mellitus, type 2, in pregnancy, third trimester: Secondary | ICD-10-CM | POA: Insufficient documentation

## 2014-10-29 DIAGNOSIS — O24913 Unspecified diabetes mellitus in pregnancy, third trimester: Secondary | ICD-10-CM

## 2014-10-29 DIAGNOSIS — O24313 Unspecified pre-existing diabetes mellitus in pregnancy, third trimester: Secondary | ICD-10-CM

## 2014-10-29 DIAGNOSIS — O24312 Unspecified pre-existing diabetes mellitus in pregnancy, second trimester: Secondary | ICD-10-CM

## 2014-10-29 DIAGNOSIS — O09523 Supervision of elderly multigravida, third trimester: Secondary | ICD-10-CM | POA: Insufficient documentation

## 2014-10-29 DIAGNOSIS — O0993 Supervision of high risk pregnancy, unspecified, third trimester: Secondary | ICD-10-CM

## 2014-10-29 DIAGNOSIS — E119 Type 2 diabetes mellitus without complications: Secondary | ICD-10-CM | POA: Insufficient documentation

## 2014-10-29 DIAGNOSIS — Z3A32 32 weeks gestation of pregnancy: Secondary | ICD-10-CM | POA: Insufficient documentation

## 2014-10-29 LAB — POCT URINALYSIS DIP (DEVICE)
Bilirubin Urine: NEGATIVE
GLUCOSE, UA: 250 mg/dL — AB
Hgb urine dipstick: NEGATIVE
Ketones, ur: NEGATIVE mg/dL
Leukocytes, UA: NEGATIVE
NITRITE: NEGATIVE
PROTEIN: NEGATIVE mg/dL
Specific Gravity, Urine: 1.01 (ref 1.005–1.030)
UROBILINOGEN UA: 0.2 mg/dL (ref 0.0–1.0)
pH: 6.5 (ref 5.0–8.0)

## 2014-10-29 NOTE — Progress Notes (Signed)
Discussed primary c/s vs vaginal delivery.  Pt seemed to not understand she had a 4th degree.  She will discuss with her husband and decided next week.   Need fetal echo report still. All fastings are nml.  Rare pp elevation to 120s. [place] for growth today.  Leaning towards primary c/s

## 2014-10-29 NOTE — Patient Instructions (Signed)
Tercer trimestre de Media planner (Third Trimester of Pregnancy) El tercer trimestre va desde la semana29 hasta la 69, desde el sptimo hasta el noveno mes, y es la poca en la que el feto crece ms rpidamente. Hacia el final del noveno mes, el feto mide alrededor de 20pulgadas (45cm) de largo y pesa entre 6 y 83 libras (2,700 y 68,500kg).  CAMBIOS EN EL ORGANISMO Su organismo atraviesa por muchos cambios durante el [place], y estos varan de Ardelia Mems mujer a Theatre manager.   Seguir [employer]. Es de esperar que aumente entre 25 y 35libras (8 y 16kg) hacia el final del Media planner.  Podrn aparecer las primeras Apache Corporation caderas, el abdomen y las [place].  Puede tener necesidad de Garment/textile technologist con ms frecuencia porque el feto baja hacia la pelvis y ejerce presin sobre la vejiga.  Debido al Glennis Brink podr sentir Victorio Palm estomacal con frecuencia.  Puede estar estreida, ya que ciertas hormonas enlentecen los movimientos de los msculos que JPM[employer] Chase [employer] Co desechos a travs de los intestinos.  Pueden aparecer hemorroides o abultarse e hincharse las venas (venas varicosas).  Puede sentir dolor plvico debido al Medtronic y a que las hormonas del Scientist, research (life sciences) las articulaciones entre los huesos de la pelvis. El dolor de espalda puede ser consecuencia de la sobrecarga de los msculos que soportan la [place].  Tal vez haya cambios en el cabello que pueden incluir su engrosamiento, crecimiento rpido y cambios en la textura. Adems, a algunas mujeres se les cae el cabello durante o despus del embarazo, o tienen el cabello seco o fino. Lo ms probable es que el cabello se le normalice despus del nacimiento del beb.  Las Lincoln National Corporation seguirn creciendo y Teaching laboratory technician. A veces, puede haber una secrecin amarilla de las mamas llamada calostro.  El ombligo puede salir hacia afuera.  Puede sentir que le falta el aire debido a que se expande el tero.  Puede notar que el feto "baja" o lo siente ms bajo, en el  abdomen.  Puede tener una prdida de secrecin mucosa con sangre. Esto suele ocurrir en el trmino de unos pocos das a una semana antes de que comience el [place].  El cuello del tero se vuelve delgado y blando (se borra) cerca de la fecha de [place]. QU DEBE ESPERAR EN LOS EXMENES PRENATALES  Le harn exmenes prenatales cada 2semanas hasta la semana36. A partir de ese momento le harn exmenes semanales. Durante una visita prenatal de rutina:  La pesarn para asegurarse de que usted y el feto estn creciendo normalmente.  Le tomarn la presin arterial.  Le medirn el abdomen para controlar el desarrollo del beb.  Se escucharn los latidos cardacos fetales.  Se evaluarn los resultados de los estudios solicitados en visitas anteriores.  Le revisarn el cuello del tero cuando est prxima la fecha de parto para controlar si este se ha borrado. Alrededor de la semana36, el mdico le revisar el cuello del tero. Al mismo tiempo, realizar un anlisis de las secreciones del tejido vaginal. Este examen es para determinar si hay un tipo de bacteria, estreptococo Grupo B. El mdico le explicar esto con ms detalle. El mdico puede preguntarle lo siguiente:  Cmo le gustara que fuera el [place].  Cmo se siente.  Si siente los movimientos del beb.  Si ha tenido sntomas anormales, como prdida de lquido, [place], dolores de cabeza intensos o clicos abdominales.  Si tiene Sunoco. Otros exmenes o estudios de deteccin que pueden realizarse  durante el tercer trimestre incluyen lo siguiente:  Anlisis de sangre para controlar las concentraciones de hierro (anemia).  Controles fetales para determinar su salud, nivel de [place] y Mining engineer. Si tiene [place] enfermedad o hay problemas durante el embarazo, le harn estudios. FALSO TRABAJO DE PARTO Es posible que sienta contracciones leves e irregulares que finalmente desaparecen. Se llaman contracciones de  Braxton Hicks o falso trabajo de McLain. Las Yahoo pueden durar horas, das o incluso semanas, antes de que el verdadero trabajo de parto se inicie. Si las contracciones ocurren a intervalos regulares, se intensifican o se hacen dolorosas, lo mejor es que la revise el mdico.  SIGNOS DE TRABAJO DE PARTO   Clicos de tipo menstrual.  Contracciones cada 18minutos o menos.  Contracciones que comienzan en la parte superior del tero y se extienden hacia abajo, a la zona inferior del abdomen y la espalda.  Sensacin de mayor presin en la pelvis o dolor de espalda.  Una secrecin de mucosidad acuosa o con sangre que sale de la vagina. Si tiene alguno de estos signos antes de la [id number] del Media planner, llame a su mdico de inmediato. Debe concurrir al hospital para que la controlen inmediatamente. INSTRUCCIONES PARA EL CUIDADO EN EL HOGAR   Evite fumar, consumir hierbas, beber alcohol y tomar frmacos que no le hayan recetado. Estas sustancias qumicas afectan la formacin y el desarrollo del beb.  Hunker mdico en relacin con el uso de medicamentos. Durante el embarazo, hay medicamentos que son seguros de tomar y otros que no.  Haga actividad fsica solo en la forma indicada por el mdico. Sentir clicos uterinos es un buen signo para Ambulance person actividad fsica.  Contine comiendo alimentos que sanos con regularidad.  Use un sostn que le brinde buen soporte si le Nordstrom.  No se d baos de inmersin en agua caliente, baos turcos ni saunas.  Colquese el cinturn de seguridad cuando conduzca.  No coma carne cruda ni queso sin cocinar; evite el contacto con las bandejas sanitarias de los gatos y la tierra que estos animales usan. Estos elementos contienen grmenes que pueden causar defectos congnitos en el beb.  [place].  Si est estreida, pruebe un laxante suave (si el mdico lo autoriza). Consuma ms alimentos ricos en  fibra, como vegetales y frutas frescos y Psychologist, prison and probation services. Beba gran cantidad de lquido para mantener la orina de tono claro o color amarillo plido.  Dese baos de asiento con agua tibia para Best boy o las molestias causadas por las hemorroides. Use una crema para las hemorroides si el mdico la autoriza.  Si tiene venas varicosas, use medias de descanso. Eleve los pies durante 72minutos, 3 o 4veces por da. Limite la cantidad de sal en su dieta.  Evite levantar objetos pesados, use zapatos de tacones bajos y [place].  Descanse con las piernas elevadas si tiene calambres o dolor de cintura.  Visite a su dentista si no lo ha Quarry manager. Use un cepillo de dientes blando para higienizarse los dientes y psese el hilo dental con suavidad.  Puede seguir American Electric Power, a menos que el mdico le indique lo contrario.  No haga viajes largos excepto que sea absolutamente necesario y solo con la autorizacin del [place] clases prenatales para Development worker, international aid, Psychologist, prison and probation services y hacer preguntas sobre el [place] de parto y [place].  Haga un ensayo de la partida al hospital.  Prepare el bolso que  llevar al hospital.  Prepare la habitacin del beb.  Concurra a todas las visitas prenatales segn las indicaciones de su mdico. SOLICITE ATENCIN MDICA SI:  No est segura de que est en trabajo de parto o de que ha roto la bolsa de las aguas.  Tiene mareos.  Siente clicos leves, presin en la pelvis o dolor persistente en el abdomen.  Tiene nuseas, vmitos o diarrea persistentes.  Tiene secrecin vaginal con mal olor.  Siente dolor al Continental Airlines. SOLICITE ATENCIN MDICA DE INMEDIATO SI:   Tiene fiebre.  Tiene una prdida de lquido por la vagina.  Tiene sangrado o pequeas prdidas vaginales.  Siente dolor intenso o clicos en el abdomen.  Sube o baja de peso rpidamente.  Tiene dificultad para respirar y siente dolor de  pecho.  Sbitamente se le hinchan mucho el rostro, las [place], los tobillos, los pies o las piernas.  No ha sentido los movimientos del beb durante Leone Brand.  Siente un dolor de cabeza intenso que no se alivia con medicamentos.  Hay cambios en la visin. Document Released: 06/24/2005 Document Revised: 09/19/2013 [facility] Patient Information 2015 [place]. This information is not intended to replace advice given to you by your health care provider. Make sure you discuss any questions you have with your health care provider.

## 2014-11-01 ENCOUNTER — Ambulatory Visit (INDEPENDENT_AMBULATORY_CARE_PROVIDER_SITE_OTHER): Payer: Self-pay | Admitting: *Deleted

## 2014-11-01 VITALS — BP 109/66 | HR 70

## 2014-11-01 DIAGNOSIS — O24913 Unspecified diabetes mellitus in pregnancy, third trimester: Secondary | ICD-10-CM

## 2014-11-01 DIAGNOSIS — O24313 Unspecified pre-existing diabetes mellitus in pregnancy, third trimester: Secondary | ICD-10-CM

## 2014-11-01 LAB — US OB FOLLOW UP

## 2014-11-01 NOTE — Progress Notes (Signed)
NST reactive.

## 2014-11-05 ENCOUNTER — Encounter (HOSPITAL_COMMUNITY): Payer: Self-pay

## 2014-11-05 ENCOUNTER — Ambulatory Visit (HOSPITAL_COMMUNITY)
Admission: RE | Admit: 2014-11-05 | Discharge: 2014-11-05 | Disposition: A | Discharge status: Home / Self Care | Payer: Self-pay | Source: Ambulatory Visit | Attending: Obstetrics & Gynecology | Admitting: Obstetrics & Gynecology

## 2014-11-05 ENCOUNTER — Ambulatory Visit (INDEPENDENT_AMBULATORY_CARE_PROVIDER_SITE_OTHER): Payer: Self-pay | Admitting: Obstetrics & Gynecology

## 2014-11-05 VITALS — BP 111/66 | HR 66

## 2014-11-05 DIAGNOSIS — O24313 Unspecified pre-existing diabetes mellitus in pregnancy, third trimester: Secondary | ICD-10-CM

## 2014-11-05 DIAGNOSIS — O09523 Supervision of elderly multigravida, third trimester: Secondary | ICD-10-CM | POA: Insufficient documentation

## 2014-11-05 DIAGNOSIS — O24113 Pre-existing diabetes mellitus, type 2, in pregnancy, third trimester: Secondary | ICD-10-CM | POA: Insufficient documentation

## 2014-11-05 DIAGNOSIS — Z3A33 33 weeks gestation of pregnancy: Secondary | ICD-10-CM | POA: Insufficient documentation

## 2014-11-05 DIAGNOSIS — O24312 Unspecified pre-existing diabetes mellitus in pregnancy, second trimester: Secondary | ICD-10-CM

## 2014-11-05 DIAGNOSIS — E119 Type 2 diabetes mellitus without complications: Secondary | ICD-10-CM | POA: Insufficient documentation

## 2014-11-05 DIAGNOSIS — O24913 Unspecified diabetes mellitus in pregnancy, third trimester: Secondary | ICD-10-CM

## 2014-11-05 NOTE — Progress Notes (Signed)
US for growth done today 

## 2014-11-07 NOTE — Progress Notes (Signed)
NST reactive on 11/05/14

## 2014-11-08 ENCOUNTER — Ambulatory Visit (INDEPENDENT_AMBULATORY_CARE_PROVIDER_SITE_OTHER): Payer: Self-pay | Admitting: *Deleted

## 2014-11-08 DIAGNOSIS — O24913 Unspecified diabetes mellitus in pregnancy, third trimester: Secondary | ICD-10-CM

## 2014-11-08 DIAGNOSIS — O24313 Unspecified pre-existing diabetes mellitus in pregnancy, third trimester: Secondary | ICD-10-CM

## 2014-11-08 NOTE — Progress Notes (Signed)
NST performed today was reviewed and was found to be reactive.  Continue recommended antenatal testing and prenatal care.  

## 2014-11-12 ENCOUNTER — Encounter: Payer: Self-pay | Admitting: Obstetrics and Gynecology

## 2014-11-13 ENCOUNTER — Other Ambulatory Visit: Payer: Self-pay | Admitting: *Deleted

## 2014-11-13 MED ORDER — GLYBURIDE 2.5 MG PO TABS: 2.5000 mg | ORAL_TABLET | Freq: Every day | ORAL | Status: DC

## 2014-11-13 NOTE — Progress Notes (Signed)
Pt presented to clinic registration window today and stated that she needs refill of Glyburide. Refill e-prescribed per request.

## 2014-11-13 NOTE — Progress Notes (Signed)
NST performed today was reviewed and was found to be reactive.  Continue recommended antenatal testing and prenatal care.  

## 2014-11-15 ENCOUNTER — Ambulatory Visit (INDEPENDENT_AMBULATORY_CARE_PROVIDER_SITE_OTHER): Payer: Self-pay | Admitting: *Deleted

## 2014-11-15 VITALS — BP 101/61 | HR 67

## 2014-11-15 DIAGNOSIS — O24913 Unspecified diabetes mellitus in pregnancy, third trimester: Secondary | ICD-10-CM

## 2014-11-15 DIAGNOSIS — O24313 Unspecified pre-existing diabetes mellitus in pregnancy, third trimester: Secondary | ICD-10-CM

## 2014-11-15 MED ORDER — PRENATAL MULTIVITAMIN CH: 1.0000 | ORAL_TABLET | Freq: Every day | ORAL | Status: DC

## 2014-11-15 NOTE — Progress Notes (Signed)
NST reviewed and reactive.  Verlin Duke L. Harraway-Smith, M.D., FACOG    

## 2014-11-15 NOTE — Progress Notes (Signed)
Pt requested refill of prenatal vitamins - Rx sent to pharmacy.

## 2014-11-19 ENCOUNTER — Other Ambulatory Visit: Payer: Self-pay | Admitting: Obstetrics & Gynecology

## 2014-11-19 ENCOUNTER — Ambulatory Visit (INDEPENDENT_AMBULATORY_CARE_PROVIDER_SITE_OTHER): Payer: Self-pay | Admitting: Obstetrics & Gynecology

## 2014-11-19 VITALS — BP 106/62 | HR 70 | Temp 98.0°F | Wt 173.7 lb

## 2014-11-19 DIAGNOSIS — O24313 Unspecified pre-existing diabetes mellitus in pregnancy, third trimester: Secondary | ICD-10-CM

## 2014-11-19 DIAGNOSIS — O0993 Supervision of high risk pregnancy, unspecified, third trimester: Secondary | ICD-10-CM

## 2014-11-19 DIAGNOSIS — O24913 Unspecified diabetes mellitus in pregnancy, third trimester: Secondary | ICD-10-CM

## 2014-11-19 LAB — OB RESULTS CONSOLE GC/CHLAMYDIA
Chlamydia: NEGATIVE
Gonorrhea: NEGATIVE

## 2014-11-19 LAB — POCT URINALYSIS DIP (DEVICE)
BILIRUBIN URINE: NEGATIVE
Glucose, UA: 250 mg/dL — AB
Ketones, ur: NEGATIVE mg/dL
Leukocytes, UA: NEGATIVE
NITRITE: NEGATIVE
PH: 6 (ref 5.0–8.0)
Protein, ur: NEGATIVE mg/dL
Specific Gravity, Urine: 1.02 (ref 1.005–1.030)
Urobilinogen, UA: 0.2 mg/dL (ref 0.0–1.0)

## 2014-11-19 LAB — US OB FOLLOW UP

## 2014-11-19 LAB — OB RESULTS CONSOLE GBS: STREP GROUP B AG: NEGATIVE

## 2014-11-19 MED ORDER — PRENATAL MULTIVITAMIN CH: 1.0000 | ORAL_TABLET | Freq: Every day | ORAL | Status: DC

## 2014-11-19 MED ORDER — PRENATAL PLUS 27-1 MG PO TABS: 1.0000 | ORAL_TABLET | Freq: Every day | ORAL | Status: DC

## 2014-11-19 NOTE — Patient Instructions (Signed)
Episiotoma (Episiotomy) La episiotoma es un corte quirrgico (incisin) en el rea de piel entre la abertura vaginal y el ano (perineo). La episiotoma sirve para agrandar la abertura vaginal a fin de que el beb tenga ms espacio por donde Catering manager. Tambin puede hacerse si el mdico necesita ayudarla a dar a luz rpidamente durante un parto asistido con frceps o ventosa. La episiotoma puede F[employer] siguientes casos:  Para ayudar a prevenir un desgarro complejo en la vagina, la vulva, el perineo y el recto.  Si los hombros o la cabeza del beb son [place] y no pasan a travs de la abertura vaginal.  Si el beb presenta signos de sufrimiento fetal y debe dar a luz inmediatamente. En general, la episiotoma se realiza al final del parto y justo antes del nacimiento del beb. Si bien la episiotoma ya no es parte del procedimiento de [place], su mdico podra determinar que es necesaria para que el beb nazca en forma segura. INFORME A SU MDICO:  Cualquier alergia que tenga.  Todos los Lyondell Chemical, incluidos vitaminas, hierbas, gotas oftlmicas, cremas y medicamentos de venta libre.  Problemas previos que usted o los UnitedHealth de su familia hayan tenido con el uso de anestsicos.  Enfermedades de Campbell Soup.  Cirugas previas.  Enfermedades. RIESGOS Y COMPLICACIONES  En general, se trata de un procedimiento seguro. Sin embargo, Engineer, technical sales, pueden surgir problemas. Estos son algunos posibles problemas:  Hemorragias.  Un mayor desgarro del perineo a medida que el beb atraviesa el canal de [place].  Acumulacin de sangre en el rea de la episiotoma (hematoma).  Infeccin.  Dolor.  Prdida del control intestinal.  Mala cicatrizacin del lugar de la episiotoma, lo que produce una cicatriz abultada.  [place]. ANTES DEL PROCEDIMIENTO  [place] su plan de parto con su  mdico. Pregntele todas las dudas que tenga sobre este procedimiento. Comunquele sus preocupaciones y entienda cundo es necesario realizar una episiotoma.  Le solicitarn que firme un formulario de consentimiento antes del [place]. El formulario implica otorgar el consentimiento para que su mdico le realice una episiotoma en caso de ser necesaria [place]. PROCEDIMIENTO   Le limpiarn el rea del perineo antes del procedimiento.  Le inyectarn un medicamento anestsico en el rea. Si le administran una epidural, tal vez no necesite el anestsico.  Existen dos tipos de incisin:  Incisin en la lnea media. La incisin se hace en forma descendente directamente entre la vagina y el ano.  Incisin mediolateral. La incisin se hace en forma descendente pero no en direccin al recto para evitar que el corte desgarre el esfnter anal.  Despus de que nace el beb (y a veces antes de expulsar la placenta), se cierra la episiotoma con puntos absorbibles (suturas). Sonoita se disolvern con el tiempo, no necesita que se las retiren.  Si es necesario, le darn medicamentos para Conservation officer, historic buildings.  Pueden colocarle hielo en el rea de la episiotoma para adormecerla y reducir la hinchazn.  Su mdico podra recomendarle que tome baos frecuentes con la cadera y las nalgas sumergidas en el agua (baos de asiento) para Theatre manager la piel del perineo limpia.  En general, la episiotoma tarda entre 4 y 9 semanas en cicatrizar. Document Released: 09/03/2011 Document Revised: 09/19/2013 [facility] Patient Information 2015 [place]. This information is not intended to replace advice given to you by your health care provider.  Make sure you discuss any questions you have with your health care provider.

## 2014-11-19 NOTE — Progress Notes (Signed)
NST reactive and AFI nl. FBS and PP in range no change in tx. Leaning toward primary cs for h/o 4th degree lac with vacuum and episiotomy. Agrees to have [place] and discuss in future.

## 2014-11-19 NOTE — Progress Notes (Signed)
Reports intermittent pelvic pressure.  Reports PNV has not been sent to pharmacy. Needs refill.

## 2014-11-20 LAB — GC/CHLAMYDIA PROBE AMP
CT PROBE, AMP APTIMA: NEGATIVE
GC PROBE AMP APTIMA: NEGATIVE

## 2014-11-20 MED ORDER — PRENATAL MULTIVITAMIN CH: 1.0000 | ORAL_TABLET | Freq: Every day | ORAL | Status: DC

## 2014-11-21 ENCOUNTER — Encounter: Payer: Self-pay | Admitting: Obstetrics & Gynecology

## 2014-11-21 LAB — CULTURE, BETA STREP (GROUP B ONLY)

## 2014-11-22 ENCOUNTER — Ambulatory Visit (INDEPENDENT_AMBULATORY_CARE_PROVIDER_SITE_OTHER): Payer: Self-pay | Admitting: *Deleted

## 2014-11-22 VITALS — BP 110/62 | HR 69

## 2014-11-22 DIAGNOSIS — O24313 Unspecified pre-existing diabetes mellitus in pregnancy, third trimester: Secondary | ICD-10-CM

## 2014-11-22 DIAGNOSIS — O24913 Unspecified diabetes mellitus in pregnancy, third trimester: Secondary | ICD-10-CM

## 2014-11-22 NOTE — Progress Notes (Signed)
NST

## 2014-11-23 NOTE — Progress Notes (Signed)
NST reviewed and reactive.  

## 2014-11-26 ENCOUNTER — Ambulatory Visit (HOSPITAL_COMMUNITY): Payer: Self-pay

## 2014-11-26 ENCOUNTER — Ambulatory Visit (INDEPENDENT_AMBULATORY_CARE_PROVIDER_SITE_OTHER): Payer: Self-pay | Admitting: Family Medicine

## 2014-11-26 VITALS — BP 106/60 | HR 65 | Temp 98.4°F | Wt 175.3 lb

## 2014-11-26 DIAGNOSIS — O24913 Unspecified diabetes mellitus in pregnancy, third trimester: Secondary | ICD-10-CM

## 2014-11-26 DIAGNOSIS — O24313 Unspecified pre-existing diabetes mellitus in pregnancy, third trimester: Secondary | ICD-10-CM

## 2014-11-26 DIAGNOSIS — O0993 Supervision of high risk pregnancy, unspecified, third trimester: Secondary | ICD-10-CM

## 2014-11-26 LAB — POCT URINALYSIS DIP (DEVICE)
Bilirubin Urine: NEGATIVE
GLUCOSE, UA: NEGATIVE mg/dL
Ketones, ur: NEGATIVE mg/dL
Leukocytes, UA: NEGATIVE
NITRITE: NEGATIVE
PH: 6.5 (ref 5.0–8.0)
Protein, ur: NEGATIVE mg/dL
Specific Gravity, Urine: 1.015 (ref 1.005–1.030)
UROBILINOGEN UA: 0.2 mg/dL (ref 0.0–1.0)

## 2014-11-26 LAB — US OB FOLLOW UP

## 2014-11-26 NOTE — Progress Notes (Deleted)
[place] for growth scheduled 3/7.

## 2014-11-26 NOTE — Progress Notes (Signed)
Fasting - controlled 2hr PP 1 of 21 elevated. Still taking glyburide 2.5mg  in evening. As follow up US for growth next week - will decide after the [place] whether to have elective primary c-section for previous 4th degree laceration. NST reactive

## 2014-11-26 NOTE — Progress Notes (Signed)
OBF/NST/AFI Pt complains of headache today, feels like it is a sinus infection. Pt has had the headache since Friday, intermittently.  [place] for growth on 3/7 - scheduled.

## 2014-11-28 ENCOUNTER — Inpatient Hospital Stay (HOSPITAL_COMMUNITY)
Admission: AD | Admit: 2014-11-28 | Discharge: 2014-11-28 | Disposition: A | Discharge status: Home / Self Care | Payer: Self-pay | Source: Ambulatory Visit | Attending: Obstetrics & Gynecology | Admitting: Obstetrics & Gynecology

## 2014-11-28 ENCOUNTER — Encounter (HOSPITAL_COMMUNITY): Payer: Self-pay | Admitting: *Deleted

## 2014-11-28 DIAGNOSIS — O24113 Pre-existing diabetes mellitus, type 2, in pregnancy, third trimester: Secondary | ICD-10-CM | POA: Insufficient documentation

## 2014-11-28 DIAGNOSIS — O9A213 Injury, poisoning and certain other consequences of external causes complicating pregnancy, third trimester: Secondary | ICD-10-CM

## 2014-11-28 DIAGNOSIS — R51 Headache: Secondary | ICD-10-CM | POA: Insufficient documentation

## 2014-11-28 DIAGNOSIS — S3991XA Unspecified injury of abdomen, initial encounter: Secondary | ICD-10-CM

## 2014-11-28 DIAGNOSIS — E119 Type 2 diabetes mellitus without complications: Secondary | ICD-10-CM | POA: Insufficient documentation

## 2014-11-28 DIAGNOSIS — O9989 Other specified diseases and conditions complicating pregnancy, childbirth and the puerperium: Secondary | ICD-10-CM | POA: Insufficient documentation

## 2014-11-28 DIAGNOSIS — Z3A37 37 weeks gestation of pregnancy: Secondary | ICD-10-CM | POA: Insufficient documentation

## 2014-11-28 DIAGNOSIS — Y92481 Parking lot as the place of occurrence of the external cause: Secondary | ICD-10-CM | POA: Insufficient documentation

## 2014-11-28 DIAGNOSIS — W19XXXA Unspecified fall, initial encounter: Secondary | ICD-10-CM

## 2014-11-28 DIAGNOSIS — W010XXA Fall on same level from slipping, tripping and stumbling without subsequent striking against object, initial encounter: Secondary | ICD-10-CM | POA: Insufficient documentation

## 2014-11-28 DIAGNOSIS — W1830XA Fall on same level, unspecified, initial encounter: Secondary | ICD-10-CM

## 2014-11-28 NOTE — MAU Note (Signed)
Patient tripped in [place] parking lot and fell on her side.  States hasn't felt the baby move.  Denies LOF or vaginal bleeding.

## 2014-11-28 NOTE — MAU Provider Note (Signed)
History     CSN: [id number]  Arrival date and time: 11/28/14 1705   First Provider Initiated Contact with Patient 11/28/14 1733      Chief Complaint  Patient presents with  . Fall   Fall Associated symptoms include headaches. Pertinent negatives include no fever.    Lynn Burnett is a 34 y.o. F, [id number], at [redacted]w[redacted]d, with a h/o gDM on glyburide, here today s/p fall.  Fall occurred approx 4pm on day of presentation, after tripping in [employer] parking lot, landed on left side; flank more than abdomen.  She reports no fetal movement for one hour; which is markedly different from normal activity, but did note normal activity at one hour (while waiting in triage).  Reports taking meds as prescribed, no N/V, bleeding, headaches.  Some LE edema b/l.    Past Medical History  Diagnosis Date  . Diabetes mellitus     prediabetes per MD no meds    History reviewed. No pertinent past surgical history.  Family History  Problem Relation Age of Onset  . Cancer Mother     cervical  . Miscarriages / Stillbirths Mother   . Cancer Maternal Aunt     History  Substance Use Topics  . Smoking status: Never Smoker   . Smokeless tobacco: Never Used  . Alcohol Use: No    Allergies: No Known Allergies  Prescriptions prior to admission  Medication Sig Dispense Refill Last Dose  . aspirin 81 MG chewable tablet Chew 81 mg by mouth daily.   Taking  . glyBURIDE (DIABETA) 2.5 MG tablet Take 1 tablet (2.5 mg total) by mouth at bedtime. 30 tablet 3 Taking  . Prenatal Vit-Fe Fumarate-FA (PRENATAL MULTIVITAMIN) TABS tablet Take 1 tablet by mouth daily. 30 tablet 3 Taking  . prenatal vitamin w/FE, FA (PRENATAL 1 + 1) 27-1 MG TABS tablet Take 1 tablet by mouth daily at 12 noon. (Patient not taking: Reported on 11/26/2014) 30 each 3 Not Taking    Review of Systems  Constitutional: Negative for fever and chills.  HENT: Positive for congestion.        Headaches/sinus pain  Eyes: Negative for blurred vision  and double vision.  Respiratory: Negative for cough and shortness of breath.   Cardiovascular: Negative for chest pain and palpitations.  Genitourinary: Negative for dysuria.  Musculoskeletal: Negative for myalgias.  Skin: Negative for rash.  Neurological: Positive for headaches. Negative for dizziness.   Physical Exam   Blood pressure 114/77, pulse 62, temperature 98.1 F (36.7 C), temperature source Oral, resp. rate 16, last menstrual period 03/14/2014, currently breastfeeding.  Physical Exam  Constitutional: She is oriented to person, place, and time. She appears well-developed and well-nourished.  HENT:  Head: Normocephalic.  Eyes: Conjunctivae and EOM are normal. Pupils are equal, round, and reactive to light.  Cardiovascular: Normal rate, regular rhythm and normal heart sounds.   Respiratory: Effort normal and breath sounds normal. No respiratory distress. She has no wheezes. She exhibits no tenderness.  GI: She exhibits distension. She exhibits no mass. There is no tenderness. There is no rebound and no guarding.  Neurological: She is alert and oriented to person, place, and time.  Skin: Skin is warm and dry.  Psychiatric: She has a normal mood and affect. Her behavior is normal. Thought content normal.    MAU Course  Procedures  MDM Monitored x 4 hrs. Fetal Monitor with baseline 130bpm, moderate variability, + accelerations, no decelerations, no contractions  Assessment and Plan   A: 33  y.o. [id number] at [redacted]w[redacted]d w/gDM presents s/p fall     Fetal heart monitor     No bleeding  P: Observation; reassurance  Stable for discharge.  Henson,Amber 11/28/2014, 5:47 PM   I have seen and examined this patient and I agree with the above. Has been on EFM x 4 hrs without incident. Denies pain, leaking or bldg and is feeling good FM now. Has NST tomorrow at clinic. Serita Grammes CNM 9:37 PM 11/28/2014

## 2014-11-28 NOTE — Discharge Instructions (Signed)
Qu debo saber acerca de las lesiones durante el embarazo? (What Do I Need to Know About Injuries During Pregnancy?) Los traumatismos son la causa ms frecuente de lesin y Emerson Electric, y tambin pueden causar daos importantes o la muerte del beb. En el vientre (tero), el beb est protegido por una bolsa llena de lquido (saco amnitico). Si se produce un traumatismo directo de alto impacto en el abdomen y la pelvis, el beb puede sufrir daos. Este tipo de traumatismo puede causar el desgarro del tero, la separacin de la placenta de la pared uterina (desprendimiento de la placenta) o la rotura del saco amnitico (ruptura de las Becker). Estas lesiones pueden disminuir o interrumpir la irrigacin de sangre al beb, o provocar el trabajo de parto antes de lo previsto. Generalmente, las cadas menores y los accidentes automovilsticos de bajo impacto no daan al beb, incluso si usted sufre lesiones muy leves. QU TIPOS DE LESIONES PUEDEN AFECTAR MI [facility]? Las causas ms frecuentes de lesin o muerte de un beb incluyen lo siguiente:  Cadas. Las cadas son ms frecuentes en el segundo y Geologist, engineering trimestre del Media planner. Los factores que aumentan el riesgo de cadas son:  Mickeal Skinner.  Cambio en el centro de gravedad.  Tropezones con un objeto que no puede ver.  Falta de rigidez Nurse, adult) de los ligamentos, lo que provoca movimientos menos coordinados (puede sentirse torpe).  Enbridge Energy se realizan actividades de alto riesgo, como equitacin o esqu.  Accidentes automovilsticos. Es importante usar Merchandiser, retail cinturn de seguridad, con el cinturn del regazo por debajo del abdomen, y conducir siempre con cuidado.  Violencia o agresin domstica.  Quemaduras (por fuego o electricidad). Las causas ms frecuentes de lesiones o muerte de las embarazadas incluyen lo siguiente:  Lesiones que causan una hemorragia grave, shock y la prdida de la  irrigacin sangunea a los principales rganos.  Lesiones en la cabeza o el cuello que causan lesiones cerebrales o espinales graves.  Traumatismos en el trax que pueden causar una lesin directa en el corazn y los pulmones, o cualquier lesin que afecta la zona de las [place]. Los traumatismos en estas zonas pueden causar un paro cardiorrespiratorio. QU PUEDO HACER PARA PROTEGERME Y PROTEGER A MI BEB DE LAS LESIONES MIENTRAS ESTOY [place]?  Quite las alfombrillas con las que puede resbalarse y los objetos sueltos en el piso que aumentan el riesgo de tropezones.  No camine sobre pisos mojados o resbalosos.  Use un calzado cmodo con suela de buena adherencia. No use zapatos con tacones altos.  Use siempre el cinturn de seguridad del modo correcto, con el cinturn del regazo por debajo del abdomen, y [place] con cuidado. No ande en motocicleta mientras est embarazada.  No participe en actividades ni practique deportes de alto impacto.  Evite encender fuego, levantar recipientes pesados que contengan lquidos calientes, o reparar problemas elctricos.  Utilice los medicamentos de venta libre o recetados para Glass blower/designer, Health and safety inspector o la fiebre, segn se lo indique el mdico.  Conozca su tipo de [place] y el tipo de sangre del padre en caso de que tenga una hemorragia vaginal o sufra una lesin debido a la cual sea necesaria una transfusin de [place].  Comunquese con el servicio de emergencias de su localidad (911 en los [place]) si es vctima de violencia o agresin domstica. El abuso a Retail banker puede ser causa significativa de traumatismo durante el [place]. Para obtener ayuda y 9, pngase en contacto con  la Lnea directa nacional para la violencia domstica (National Domestic Violence Hotline). [place]?   Se cae sobre el abdomen o sufre un accidente o una lesin muy fuertes.  Sufri una agresin (domstica o de otro  tipo).  Tuvo un accidente automovilstico.  Presenta una hemorragia vaginal abundante.  Tiene prdida de lquido por la vagina.  Siente contracciones uterinas (clicos plvicos, dolor o mucho dolor de espalda).  Se siente dbil, se desmaya o presenta vmitos persistentes luego de cualquier traumatismo.  Sufri una [place] grave, por ejemplo, [employer]Span, el cuello, las manos o los genitales, o las quemaduras son ms grandes que el tamao de la palma de la mano en Therapist, music.  Tiene rigidez o dolor en el cuello despus de una cada o debido a otro traumatismo.  Siente dolor de [place] o tiene problemas visuales despus de una cada o algn otro traumatismo.  No siente que el beb se mueve o el beb no se mueve tanto como antes de sufrir una cada u otro traumatismo. Document Released: 09/14/2005 Document Revised: 01/29/2014 [facility] Patient Information 2015 [place], [place]. This information is not intended to replace advice given to you by your health care provider. Make sure you discuss any questions you have with your health care provider.

## 2014-11-29 ENCOUNTER — Ambulatory Visit (INDEPENDENT_AMBULATORY_CARE_PROVIDER_SITE_OTHER): Payer: Self-pay | Admitting: *Deleted

## 2014-11-29 VITALS — BP 108/64 | HR 70

## 2014-11-29 DIAGNOSIS — O24313 Unspecified pre-existing diabetes mellitus in pregnancy, third trimester: Secondary | ICD-10-CM

## 2014-11-29 DIAGNOSIS — O24913 Unspecified diabetes mellitus in pregnancy, third trimester: Secondary | ICD-10-CM

## 2014-11-29 NOTE — Progress Notes (Signed)
Pt reports that she fell yesterday and had evaluation @ MAU. She denies pain or decreased fetal movement today.

## 2014-12-03 ENCOUNTER — Encounter: Payer: Self-pay | Admitting: Obstetrics and Gynecology

## 2014-12-03 ENCOUNTER — Other Ambulatory Visit (HOSPITAL_COMMUNITY): Payer: Self-pay | Admitting: Obstetrics and Gynecology

## 2014-12-03 ENCOUNTER — Encounter: Payer: Self-pay | Admitting: *Deleted

## 2014-12-03 ENCOUNTER — Ambulatory Visit (HOSPITAL_COMMUNITY)
Admission: RE | Admit: 2014-12-03 | Discharge: 2014-12-03 | Disposition: A | Discharge status: Home / Self Care | Payer: Self-pay | Source: Ambulatory Visit | Attending: Obstetrics & Gynecology | Admitting: Obstetrics & Gynecology

## 2014-12-03 ENCOUNTER — Encounter (HOSPITAL_COMMUNITY): Payer: Self-pay

## 2014-12-03 ENCOUNTER — Ambulatory Visit (INDEPENDENT_AMBULATORY_CARE_PROVIDER_SITE_OTHER): Payer: Self-pay | Admitting: Obstetrics and Gynecology

## 2014-12-03 VITALS — BP 115/71 | HR 58 | Wt 174.8 lb

## 2014-12-03 DIAGNOSIS — O24913 Unspecified diabetes mellitus in pregnancy, third trimester: Secondary | ICD-10-CM

## 2014-12-03 DIAGNOSIS — O0993 Supervision of high risk pregnancy, unspecified, third trimester: Secondary | ICD-10-CM

## 2014-12-03 DIAGNOSIS — O24113 Pre-existing diabetes mellitus, type 2, in pregnancy, third trimester: Secondary | ICD-10-CM | POA: Insufficient documentation

## 2014-12-03 DIAGNOSIS — O24313 Unspecified pre-existing diabetes mellitus in pregnancy, third trimester: Secondary | ICD-10-CM

## 2014-12-03 DIAGNOSIS — Z8632 Personal history of gestational diabetes: Secondary | ICD-10-CM

## 2014-12-03 DIAGNOSIS — O09293 Supervision of pregnancy with other poor reproductive or obstetric history, third trimester: Secondary | ICD-10-CM

## 2014-12-03 DIAGNOSIS — Z3A37 37 weeks gestation of pregnancy: Secondary | ICD-10-CM | POA: Insufficient documentation

## 2014-12-03 DIAGNOSIS — O09522 Supervision of elderly multigravida, second trimester: Secondary | ICD-10-CM

## 2014-12-03 DIAGNOSIS — O09523 Supervision of elderly multigravida, third trimester: Secondary | ICD-10-CM | POA: Insufficient documentation

## 2014-12-03 DIAGNOSIS — O24312 Unspecified pre-existing diabetes mellitus in pregnancy, second trimester: Secondary | ICD-10-CM

## 2014-12-03 DIAGNOSIS — Z3A38 38 weeks gestation of pregnancy: Secondary | ICD-10-CM

## 2014-12-03 DIAGNOSIS — E119 Type 2 diabetes mellitus without complications: Secondary | ICD-10-CM | POA: Insufficient documentation

## 2014-12-03 LAB — FETAL NONSTRESS TEST

## 2014-12-03 LAB — POCT URINALYSIS DIP (DEVICE)
BILIRUBIN URINE: NEGATIVE
GLUCOSE, UA: NEGATIVE mg/dL
KETONES UR: NEGATIVE mg/dL
Leukocytes, UA: NEGATIVE
Nitrite: NEGATIVE
Protein, ur: NEGATIVE mg/dL
SPECIFIC GRAVITY, URINE: 1.01 (ref 1.005–1.030)
Urobilinogen, UA: 0.2 mg/dL (ref 0.0–1.0)
pH: 5.5 (ref 5.0–8.0)

## 2014-12-03 NOTE — ED Notes (Signed)
Spanish interpreter Cresenciano Genre with pt.

## 2014-12-03 NOTE — Progress Notes (Signed)
[place] for growth today @ 1030.  IOL scheduled 3/16 @ 0730.

## 2014-12-03 NOTE — Progress Notes (Signed)
Patient is doing well without complaints. FM/labor precautions reviewed. CBGs reviewed and all within range. Patient desires a vaginal delivery and will be scheduled for IOL on 3/16. She has a growth ultrasound today and may change her mind to a PLTCS depending on EFW. NST reviewed and reactive

## 2014-12-03 NOTE — Progress Notes (Signed)
NST reviewed and reactive.  Nathaniel Wakeley L. Harraway-Smith, M.D., FACOG    

## 2014-12-06 ENCOUNTER — Ambulatory Visit (INDEPENDENT_AMBULATORY_CARE_PROVIDER_SITE_OTHER): Payer: Self-pay | Admitting: *Deleted

## 2014-12-06 VITALS — BP 113/69 | HR 74

## 2014-12-06 DIAGNOSIS — O24313 Unspecified pre-existing diabetes mellitus in pregnancy, third trimester: Secondary | ICD-10-CM

## 2014-12-06 DIAGNOSIS — O24913 Unspecified diabetes mellitus in pregnancy, third trimester: Secondary | ICD-10-CM

## 2014-12-07 NOTE — Progress Notes (Signed)
NST reviewed and reactive.  Sue Fernicola L. Harraway-Smith, M.D., FACOG    

## 2014-12-10 ENCOUNTER — Encounter (HOSPITAL_COMMUNITY): Payer: Self-pay | Admitting: *Deleted

## 2014-12-10 ENCOUNTER — Ambulatory Visit (INDEPENDENT_AMBULATORY_CARE_PROVIDER_SITE_OTHER): Payer: Self-pay | Admitting: Obstetrics & Gynecology

## 2014-12-10 ENCOUNTER — Ambulatory Visit (HOSPITAL_COMMUNITY): Payer: Self-pay

## 2014-12-10 ENCOUNTER — Inpatient Hospital Stay (HOSPITAL_COMMUNITY)
Admission: AD | Admit: 2014-12-10 | Discharge: 2014-12-12 | DRG: 775 | Disposition: A | Discharge status: Home / Self Care | Payer: Medicaid Other | Source: Ambulatory Visit | Attending: Obstetrics and Gynecology | Admitting: Obstetrics and Gynecology

## 2014-12-10 ENCOUNTER — Telehealth (HOSPITAL_COMMUNITY): Payer: Self-pay | Admitting: *Deleted

## 2014-12-10 VITALS — BP 110/70 | HR 61 | Wt 178.1 lb

## 2014-12-10 DIAGNOSIS — Z3A38 38 weeks gestation of pregnancy: Secondary | ICD-10-CM | POA: Diagnosis present

## 2014-12-10 DIAGNOSIS — O4103X0 Oligohydramnios, third trimester, not applicable or unspecified: Secondary | ICD-10-CM | POA: Insufficient documentation

## 2014-12-10 DIAGNOSIS — O4103X1 Oligohydramnios, third trimester, fetus 1: Secondary | ICD-10-CM

## 2014-12-10 DIAGNOSIS — Z3483 Encounter for supervision of other normal pregnancy, third trimester: Secondary | ICD-10-CM | POA: Diagnosis present

## 2014-12-10 DIAGNOSIS — O24913 Unspecified diabetes mellitus in pregnancy, third trimester: Secondary | ICD-10-CM

## 2014-12-10 DIAGNOSIS — O09523 Supervision of elderly multigravida, third trimester: Secondary | ICD-10-CM | POA: Diagnosis not present

## 2014-12-10 DIAGNOSIS — O24429 Gestational diabetes mellitus in childbirth, unspecified control: Principal | ICD-10-CM | POA: Diagnosis present

## 2014-12-10 DIAGNOSIS — O0993 Supervision of high risk pregnancy, unspecified, third trimester: Secondary | ICD-10-CM

## 2014-12-10 DIAGNOSIS — O24313 Unspecified pre-existing diabetes mellitus in pregnancy, third trimester: Secondary | ICD-10-CM

## 2014-12-10 DIAGNOSIS — O4100X0 Oligohydramnios, unspecified trimester, not applicable or unspecified: Secondary | ICD-10-CM | POA: Diagnosis present

## 2014-12-10 LAB — TYPE AND SCREEN
ABO/RH(D): B POS
Antibody Screen: NEGATIVE

## 2014-12-10 LAB — POCT URINALYSIS DIP (DEVICE)
Bilirubin Urine: NEGATIVE
GLUCOSE, UA: NEGATIVE mg/dL
KETONES UR: NEGATIVE mg/dL
LEUKOCYTES UA: NEGATIVE
Nitrite: NEGATIVE
Protein, ur: NEGATIVE mg/dL
SPECIFIC GRAVITY, URINE: 1.025 (ref 1.005–1.030)
Urobilinogen, UA: 0.2 mg/dL (ref 0.0–1.0)
pH: 6.5 (ref 5.0–8.0)

## 2014-12-10 LAB — CBC
HEMATOCRIT: 37.2 % (ref 36.0–46.0)
Hemoglobin: 13.2 g/dL (ref 12.0–15.0)
MCH: 35.4 pg — ABNORMAL HIGH (ref 26.0–34.0)
MCHC: 35.5 g/dL (ref 30.0–36.0)
MCV: 99.7 fL (ref 78.0–100.0)
Platelets: 159 10*3/uL (ref 150–400)
RBC: 3.73 MIL/uL — ABNORMAL LOW (ref 3.87–5.11)
RDW: 13.7 % (ref 11.5–15.5)
WBC: 6 10*3/uL (ref 4.0–10.5)

## 2014-12-10 LAB — US OB FOLLOW UP

## 2014-12-10 LAB — GLUCOSE, CAPILLARY
GLUCOSE-CAPILLARY: 67 mg/dL — AB (ref 70–99)
Glucose-Capillary: 156 mg/dL — ABNORMAL HIGH (ref 70–99)
Glucose-Capillary: 71 mg/dL (ref 70–99)

## 2014-12-10 LAB — ABO/RH: ABO/RH(D): B POS

## 2014-12-10 MED ORDER — LACTATED RINGERS IV SOLN: 500.0000 mL | INTRAVENOUS | Status: DC | PRN

## 2014-12-10 MED ORDER — ACETAMINOPHEN 325 MG PO TABS: 650.0000 mg | ORAL_TABLET | ORAL | Status: DC | PRN

## 2014-12-10 MED ORDER — TERBUTALINE SULFATE 1 MG/ML IJ SOLN: 0.2500 mg | Freq: Once | INTRAMUSCULAR | Status: AC | PRN

## 2014-12-10 MED ORDER — DIPHENHYDRAMINE HCL 50 MG/ML IJ SOLN: 12.5000 mg | INTRAMUSCULAR | Status: DC | PRN

## 2014-12-10 MED ORDER — OXYTOCIN BOLUS FROM INFUSION
500.0000 mL | INTRAVENOUS | Status: DC
  Administered 2014-12-11: 500 mL via INTRAVENOUS

## 2014-12-10 MED ORDER — LIDOCAINE HCL (PF) 1 % IJ SOLN
30.0000 mL | INTRAMUSCULAR | Status: DC | PRN
  Filled 2014-12-10 (×2): qty 30

## 2014-12-10 MED ORDER — LACTATED RINGERS IV SOLN
INTRAVENOUS | Status: DC
  Administered 2014-12-10 – 2014-12-11 (×3): via INTRAVENOUS

## 2014-12-10 MED ORDER — FENTANYL 2.5 MCG/ML BUPIVACAINE 1/10 % EPIDURAL INFUSION (WH - ANES)
14.0000 mL/h | INTRAMUSCULAR | Status: DC | PRN
  Filled 2014-12-10: qty 125

## 2014-12-10 MED ORDER — OXYCODONE-ACETAMINOPHEN 5-325 MG PO TABS: 1.0000 | ORAL_TABLET | ORAL | Status: DC | PRN

## 2014-12-10 MED ORDER — PHENYLEPHRINE 40 MCG/ML (10ML) SYRINGE FOR IV PUSH (FOR BLOOD PRESSURE SUPPORT)
80.0000 ug | PREFILLED_SYRINGE | INTRAVENOUS | Status: DC | PRN
  Filled 2014-12-10: qty 20

## 2014-12-10 MED ORDER — ONDANSETRON HCL 4 MG/2ML IJ SOLN: 4.0000 mg | Freq: Four times a day (QID) | INTRAMUSCULAR | Status: DC | PRN

## 2014-12-10 MED ORDER — MISOPROSTOL 25 MCG QUARTER TABLET
25.0000 ug | ORAL_TABLET | ORAL | Status: DC | PRN
  Administered 2014-12-10 – 2014-12-11 (×3): 25 ug via VAGINAL
  Filled 2014-12-10 (×3): qty 0.25

## 2014-12-10 MED ORDER — EPHEDRINE 5 MG/ML INJ: 10.0000 mg | INTRAVENOUS | Status: DC | PRN

## 2014-12-10 MED ORDER — CITRIC ACID-SODIUM CITRATE 334-500 MG/5ML PO SOLN: 30.0000 mL | ORAL | Status: DC | PRN

## 2014-12-10 MED ORDER — OXYTOCIN 40 UNITS IN LACTATED RINGERS INFUSION - SIMPLE MED
62.5000 mL/h | INTRAVENOUS | Status: DC
  Administered 2014-12-11: 62.5 mL/h via INTRAVENOUS
  Filled 2014-12-10 (×2): qty 1000

## 2014-12-10 MED ORDER — GLYBURIDE 2.5 MG PO TABS
2.5000 mg | ORAL_TABLET | Freq: Every day | ORAL | Status: DC
  Filled 2014-12-10: qty 1

## 2014-12-10 MED ORDER — LACTATED RINGERS IV SOLN
500.0000 mL | Freq: Once | INTRAVENOUS | Status: AC
  Administered 2014-12-11: 500 mL via INTRAVENOUS

## 2014-12-10 MED ORDER — PHENYLEPHRINE 40 MCG/ML (10ML) SYRINGE FOR IV PUSH (FOR BLOOD PRESSURE SUPPORT): 80.0000 ug | PREFILLED_SYRINGE | INTRAVENOUS | Status: DC | PRN

## 2014-12-10 MED ORDER — OXYCODONE-ACETAMINOPHEN 5-325 MG PO TABS: 2.0000 | ORAL_TABLET | ORAL | Status: DC | PRN

## 2014-12-10 NOTE — Progress Notes (Addendum)
Michaila Kenney is a 34 y.o. G3P1011 at [redacted]w[redacted]d by ultrasound admitted for induction of labor due to Low amniotic fluid. and Gestational A2B Diabetes.  Had a vacuum assisted delivery with ML Episiotomy which extended to a 4th degree laceration in 2013.  Subjective: Doing well. Not feeling much pain with contractions. CBG somewhat low but patient feels fine.   Objective: BP 107/70 mmHg  Pulse 60  Temp(Src) 98.2 F (36.8 C) (Oral)  Resp 18  Ht 5\' 3"  (1.6 m)  Wt 178 lb (80.74 kg)  BMI 31.54 kg/m2  LMP 03/14/2014      FHT:  FHR: 140 bpm, variability: moderate,  accelerations:  Present,  decelerations:  Absent UC:   irregular, every 3-4 minutes SVE:   Dilation: Closed Effacement (%): Thick Station: -3 Exam by:: Cecille Rubin, CNM  Labs: Lab Results  Component Value Date   WBC 6.0 12/10/2014   HGB 13.2 12/10/2014   HCT 37.2 12/10/2014   MCV 99.7 12/10/2014   PLT 159 12/10/2014    Assessment / Plan: Induction of labor due to oligohydramnios,  progressing well on pitocin  Labor: Progressing normally Preeclampsia:  n/a Fetal Wellbeing:  Category I Pain Control:  Labor support without medications I/D:  n/a Anticipated MOD:  NSVD  Brunette Lavalle 12/10/2014, 8:45 PM

## 2014-12-10 NOTE — Progress Notes (Signed)
Blood sugars are within range. NST performed today was reviewed and was found to be reactive.  However, AFI is 5 cm today, was 9.3 cm last week.  No report of ROM. Given new diagnosis of oligohydramnios at [redacted]w[redacted]d in A2/B GDM; IOL recommended. Patient reports that Dr. Lisbeth Renshaw who evaluated her last week was worried about decreasing AFI and told her IOL would be indicated in repeat AFI was concerning.  L&D team including RN in charge notified; Admitting also notified.

## 2014-12-10 NOTE — Progress Notes (Signed)
C/o pelvic pressure.   Pt has migraine H/A today - does not have pain medicine.

## 2014-12-10 NOTE — H&P (Signed)
LABOR ADMISSION HISTORY AND PHYSICAL  Lynn Burnett is a 34 y.o. female G3P1011 with IUP at [redacted]w[redacted]d by 8 week ultrasound presenting for induction of labor following AFI of 5cm noted in clinic down from 9.3cm last week.  Recommended c-section due to 4th degree laceration noted at previous delivery with similarly sized baby, however patient refuses. She reports +FMs, No LOF, no VB, no blurry vision, headaches or peripheral edema, and RUQ pain.  She plans on breast feeding. She is undecided on method of birth control.  Prenatal History/Complications:  Past Medical History: Past Medical History  Diagnosis Date  . Diabetes mellitus     prediabetes per MD no meds    Past Surgical History: History reviewed. No pertinent past surgical history.  Obstetrical History: OB History    Gravida Para Term Preterm AB TAB SAB Ectopic Multiple Living   3 1 1  0 1 0 1 0 0 1      Social History: History   Social History  . Marital Status: Married    Spouse Name: N/A  . Number of Children: N/A  . Years of Education: N/A   Social History Main Topics  . Smoking status: Never Smoker   . Smokeless tobacco: Never Used  . Alcohol Use: No  . Drug Use: No  . Sexual Activity: Yes    Birth Control/ Protection: Condom, None   Other Topics Concern  . None   Social History Narrative    Family History: Family History  Problem Relation Age of Onset  . Cancer Mother     cervical  . Miscarriages / Stillbirths Mother   . Cancer Maternal Aunt     Allergies: No Known Allergies  Prescriptions prior to admission  Medication Sig Dispense Refill Last Dose  . aspirin 81 MG chewable tablet Chew 81 mg by mouth daily.   12/09/2014 at 2200  . Phenylephrine-DM-GG-APAP 5-10-100-325 MG TABS Take 2 tablets by mouth 2 (two) times daily as needed (For cold symptoms.).   Past Month at Unknown time  . glyBURIDE (DIABETA) 2.5 MG tablet Take 1 tablet (2.5 mg total) by mouth at bedtime. 30 tablet 3 12/09/2014 at  2300  . Prenatal Vit-Fe Fumarate-FA (PRENATAL MULTIVITAMIN) TABS tablet Take 1 tablet by mouth daily. 30 tablet 3 12/09/2014 at 2200     Review of Systems   All systems reviewed and negative except as stated in HPI  Blood pressure 112/68, pulse 68, temperature 98.4 F (36.9 C), temperature source Oral, height 5\' 3"  (1.6 m), weight 80.74 kg (178 lb), last menstrual period 03/14/2014, currently breastfeeding. General appearance: alert, cooperative, appears stated age and no distress Lungs: clear to auscultation bilaterally Heart: regular rate and rhythm Abdomen: soft, non-tender; bowel sounds normal Pelvic: closed/soft/high  Extremities: Homans sign is negative, no sign of DVT DTR's +2 Presentation: cephalic Fetal monitoringBaseline: 145 bpm, Variability: Good {> 6 bpm) and Accelerations: Reactive Uterine activityNone    Prenatal labs: ABO, Rh: --/--/B POS (03/14 1255) Antibody: PENDING (03/14 1255) Rubella:   RPR: NON REAC (01/18 1204)  HBsAg: NEGATIVE (08/18 1654)  HIV: NONREACTIVE (01/18 1204)  GBS: Negative (02/22 0000)  1 hr Glucola: h/o A2/B GDM Genetic screening  Declined Anatomy [facility]  [facility]  Dating  LMP/ 8 week [place]  Genetic Screen declined  Anatomic [place] nml anatomy  GTT A2/B GDM  TDaP vaccine 10/15/14  Flu vaccine 06/11/14  GBS  negative  Contraception Undecided  Baby Food Breast  Circumcision No  Pediatrician [employer]  Person  FOB   Prenatal Labs  Blood type: B/POS/-- (08/18 1654)   Antibody:NEG (08/18 1654)  Rubella: 5.55 (08/18 1654)  RPR: NON REAC (01/18 1204)   HBsAg: NEGATIVE (08/18 1654)   HIV: NONREACTIVE (01/18 1204)   GBS: Negative  Pap:    Results for orders placed or performed during the hospital encounter of 12/10/14 (from the past 24 hour(s))  CBC   Collection Time: 12/10/14 12:55 PM  Result Value Ref Range   WBC 6.0 4.0 - 10.5 K/uL   RBC 3.73 (L) 3.87 - 5.11 MIL/uL   Hemoglobin 13.2 12.0 - 15.0 g/dL   HCT  37.2 36.0 - 46.0 %   MCV 99.7 78.0 - 100.0 fL   MCH 35.4 (H) 26.0 - 34.0 pg   MCHC 35.5 30.0 - 36.0 g/dL   RDW 13.7 11.5 - 15.5 %   Platelets 159 150 - 400 K/uL  Type and screen   Collection Time: 12/10/14 12:55 PM  Result Value Ref Range   ABO/RH(D) B POS    Antibody Screen PENDING    Sample Expiration 12/13/2014   Glucose, capillary   Collection Time: 12/10/14  1:31 PM  Result Value Ref Range   Glucose-Capillary 156 (H) 70 - 99 mg/dL  Results for orders placed or performed in visit on 12/10/14 (from the past 24 hour(s))  POCT urinalysis dip (device)   Collection Time: 12/10/14  8:58 AM  Result Value Ref Range   Glucose, UA NEGATIVE NEGATIVE mg/dL   Bilirubin Urine NEGATIVE NEGATIVE   Ketones, ur NEGATIVE NEGATIVE mg/dL   Specific Gravity, Urine 1.025 1.005 - 1.030   Hgb urine dipstick TRACE (A) NEGATIVE   pH 6.5 5.0 - 8.0   Protein, ur NEGATIVE NEGATIVE mg/dL   Urobilinogen, UA 0.2 0.0 - 1.0 mg/dL   Nitrite NEGATIVE NEGATIVE   Leukocytes, UA NEGATIVE NEGATIVE    Patient Active Problem List   Diagnosis Date Noted  . Oligohydramnios in third trimester, antepartum 12/10/2014  . Oligohydramnios 12/10/2014  . History of maternal fourth degree perineal laceration, currently pregnant 05/19/2014  . Supervision of high-risk pregnancy 05/17/2014  . Class O9/B diabetes complicating pregnancy, antepartum 03/01/2012    Assessment: Lynn Burnett is a 34 y.o. G3P1011 at [redacted]w[redacted]d here for IOL for oligohydramnios.  #Labor: Will initiate induction with vaginal cytotec #Pain: Percocet PRN. Will offer epidural at patient request when in active labor.   Check Blood glucose 4 x/day #ID:  GBS negative #MOF: Breast #MOC: Undecided #Circ:  None  [name] 12/10/2014, 1:46 PM

## 2014-12-10 NOTE — Progress Notes (Signed)
Inpatient Diabetes Program Recommendations  Diabetes Treatment Program Recommendations  ADA Standards of Care 2016 Diabetes in Pregnancy Target Glucose Ranges:  Fasting: 60 - 90 mg/dL Preprandial: 60 - 105 mg/dL 1 hr postprandial: Less than 140mg /dL (from first bite of meal) 2 hr postprandial: Less than 120 mg/dL (from first bit of meal)   Noted lab glucose today of 156 mg/dat 1333. Noted pt has been on glyburide. May want to check cbg's q 4 hrs if indicated.  Thank you Rosita Kea, RN, MSN, CDE  Diabetes Inpatient Program Office: [phone] Pager: [phone] 8:00 am to 5:00 pm

## 2014-12-10 NOTE — Progress Notes (Signed)
Lynn Burnett is a 34 y.o. G3P1011 at [redacted]w[redacted]d by LMP admitted for induction of labor due to Low amniotic fluid She is also a A2/B diabetic controlled with glyburide.  Subjective: Slightly uncomfortable with mild contractions otherwise no complaints  Objective: BP 111/71 mmHg  Pulse 62  Temp(Src) 97.8 F (36.6 C) (Oral)  Resp 18  Ht 5\' 3"  (1.6 m)  Wt 80.74 kg (178 lb)  BMI 31.54 kg/m2  LMP 03/14/2014      FHT:  FHR: 145 bpm, variability: moderate,  accelerations:  Present,  decelerations:  Absent UC:   irregular, every 4-5 minutes SVE:   Dilation: Closed Effacement (%): Thick Station: -3 Exam by:: Ronni Rumble, RN  Labs: Lab Results  Component Value Date   WBC 6.0 12/10/2014   HGB 13.2 12/10/2014   HCT 37.2 12/10/2014   MCV 99.7 12/10/2014   PLT 159 12/10/2014   Results for orders placed or performed during the hospital encounter of 12/10/14 (from the past 24 hour(s))  CBC     Status: Abnormal   Collection Time: 12/10/14 12:55 PM  Result Value Ref Range   WBC 6.0 4.0 - 10.5 K/uL   RBC 3.73 (L) 3.87 - 5.11 MIL/uL   Hemoglobin 13.2 12.0 - 15.0 g/dL   HCT 37.2 36.0 - 46.0 %   MCV 99.7 78.0 - 100.0 fL   MCH 35.4 (H) 26.0 - 34.0 pg   MCHC 35.5 30.0 - 36.0 g/dL   RDW 13.7 11.5 - 15.5 %   Platelets 159 150 - 400 K/uL  Type and screen     Status: None   Collection Time: 12/10/14 12:55 PM  Result Value Ref Range   ABO/RH(D) B POS    Antibody Screen NEG    Sample Expiration 12/13/2014   Glucose, capillary     Status: Abnormal   Collection Time: 12/10/14  1:31 PM  Result Value Ref Range   Glucose-Capillary 156 (H) 70 - 99 mg/dL   BG- 74 Taken by pt machine  Assessment / Plan: IUP @ 38+5 AMA Oligohydramnios   Cytotec vaginally - 2nd dose inserted by me Labor: Progressing normally Preeclampsia:  n/a Fetal Wellbeing:  Category I Pain Control:  Labor support without medications I/D:  n/a Anticipated MOD:  NSVD  Clemmons,Lori Grissett 12/10/2014, 7:20  PM

## 2014-12-10 NOTE — Telephone Encounter (Signed)
Preadmission screen Interpreter number [phone]

## 2014-12-10 NOTE — Progress Notes (Signed)
I stopped by patients room to check on her needs I ordered her meals, by Juliann Mule Spanish Interpreter

## 2014-12-10 NOTE — Progress Notes (Signed)
Pt states she just ate and checked her blood sugar 15 minutes before with her meter which was 74. Informed patient to call me before she eats again so i can check CBG with hospital meter.

## 2014-12-10 NOTE — Patient Instructions (Signed)
Return to clinic for any obstetric concerns or go to MAU for evaluation  

## 2014-12-11 ENCOUNTER — Inpatient Hospital Stay (HOSPITAL_COMMUNITY): Payer: Medicaid Other | Admitting: Anesthesiology

## 2014-12-11 ENCOUNTER — Encounter (HOSPITAL_COMMUNITY): Payer: Self-pay | Admitting: *Deleted

## 2014-12-11 DIAGNOSIS — O4103X0 Oligohydramnios, third trimester, not applicable or unspecified: Secondary | ICD-10-CM

## 2014-12-11 DIAGNOSIS — Z3A38 38 weeks gestation of pregnancy: Secondary | ICD-10-CM

## 2014-12-11 DIAGNOSIS — O24429 Gestational diabetes mellitus in childbirth, unspecified control: Secondary | ICD-10-CM

## 2014-12-11 DIAGNOSIS — Z79899 Other long term (current) drug therapy: Secondary | ICD-10-CM

## 2014-12-11 LAB — RPR: RPR: NONREACTIVE

## 2014-12-11 LAB — GLUCOSE, CAPILLARY: GLUCOSE-CAPILLARY: 103 mg/dL — AB (ref 70–99)

## 2014-12-11 MED ORDER — LIDOCAINE HCL (PF) 1 % IJ SOLN
INTRAMUSCULAR | Status: DC | PRN
  Administered 2014-12-11 (×2): 4 mL

## 2014-12-11 MED ORDER — TETANUS-DIPHTH-ACELL PERTUSSIS 5-2.5-18.5 LF-MCG/0.5 IM SUSP: 0.5000 mL | Freq: Once | INTRAMUSCULAR | Status: DC

## 2014-12-11 MED ORDER — DIPHENHYDRAMINE HCL 25 MG PO CAPS: 25.0000 mg | ORAL_CAPSULE | Freq: Four times a day (QID) | ORAL | Status: DC | PRN

## 2014-12-11 MED ORDER — FENTANYL 2.5 MCG/ML BUPIVACAINE 1/10 % EPIDURAL INFUSION (WH - ANES)
INTRAMUSCULAR | Status: DC | PRN
  Administered 2014-12-11: 14 mL/h via EPIDURAL

## 2014-12-11 MED ORDER — ZOLPIDEM TARTRATE 5 MG PO TABS: 5.0000 mg | ORAL_TABLET | Freq: Every evening | ORAL | Status: DC | PRN

## 2014-12-11 MED ORDER — SENNOSIDES-DOCUSATE SODIUM 8.6-50 MG PO TABS
2.0000 | ORAL_TABLET | ORAL | Status: DC
  Administered 2014-12-11: 2 via ORAL
  Filled 2014-12-11: qty 2

## 2014-12-11 MED ORDER — ONDANSETRON HCL 4 MG/2ML IJ SOLN: 4.0000 mg | INTRAMUSCULAR | Status: DC | PRN

## 2014-12-11 MED ORDER — TERBUTALINE SULFATE 1 MG/ML IJ SOLN
INTRAMUSCULAR | Status: AC
  Administered 2014-12-11: 1 mg
  Filled 2014-12-11: qty 1

## 2014-12-11 MED ORDER — WITCH HAZEL-GLYCERIN EX PADS: 1.0000 "application " | MEDICATED_PAD | CUTANEOUS | Status: DC | PRN

## 2014-12-11 MED ORDER — OXYTOCIN 40 UNITS IN LACTATED RINGERS INFUSION - SIMPLE MED
1.0000 m[IU]/min | INTRAVENOUS | Status: DC
  Administered 2014-12-11: 2 m[IU]/min via INTRAVENOUS

## 2014-12-11 MED ORDER — SIMETHICONE 80 MG PO CHEW: 80.0000 mg | CHEWABLE_TABLET | ORAL | Status: DC | PRN

## 2014-12-11 MED ORDER — DIBUCAINE 1 % RE OINT
1.0000 "application " | TOPICAL_OINTMENT | RECTAL | Status: DC | PRN
  Filled 2014-12-11: qty 28

## 2014-12-11 MED ORDER — PRENATAL MULTIVITAMIN CH
1.0000 | ORAL_TABLET | Freq: Every day | ORAL | Status: DC
  Administered 2014-12-11 – 2014-12-12 (×2): 1 via ORAL
  Filled 2014-12-11 (×2): qty 1

## 2014-12-11 MED ORDER — BENZOCAINE-MENTHOL 20-0.5 % EX AERO
1.0000 "application " | INHALATION_SPRAY | CUTANEOUS | Status: DC | PRN
  Administered 2014-12-11: 1 via TOPICAL
  Filled 2014-12-11 (×2): qty 56

## 2014-12-11 MED ORDER — IBUPROFEN 600 MG PO TABS
600.0000 mg | ORAL_TABLET | Freq: Four times a day (QID) | ORAL | Status: DC
  Administered 2014-12-11 – 2014-12-12 (×5): 600 mg via ORAL
  Filled 2014-12-11 (×5): qty 1

## 2014-12-11 MED ORDER — ONDANSETRON HCL 4 MG PO TABS: 4.0000 mg | ORAL_TABLET | ORAL | Status: DC | PRN

## 2014-12-11 MED ORDER — FENTANYL CITRATE 0.05 MG/ML IJ SOLN
100.0000 ug | INTRAMUSCULAR | Status: DC | PRN
  Administered 2014-12-11: 100 ug via INTRAVENOUS
  Filled 2014-12-11: qty 2

## 2014-12-11 MED ORDER — LANOLIN HYDROUS EX OINT: TOPICAL_OINTMENT | CUTANEOUS | Status: DC | PRN

## 2014-12-11 NOTE — Progress Notes (Signed)
Patient ID: Deniz Eskridge, female   DOB: 1981-02-08, 34 y.o.   MRN: [id number]  Doing well, Feeling contractions in her back  CBGs:  Results for HARLEA, GOETZINGER (MRN [id number]) as of 12/11/2014 00:15  Ref. Range 12/10/2014 20:40 12/10/2014 22:21  Glucose-Capillary Latest Range: 70-99 mg/dL 67 (L) 71   Night dose of Glyburide held due to low sugars  Filed Vitals:   12/10/14 1302 12/10/14 1740 12/10/14 1915 12/11/14 0015  BP: 108/65 111/71 107/70 102/65  Pulse: 63 62 60 56  Temp:  97.8 F (36.6 C) 98.2 F (36.8 C) 97.8 F (36.6 C)  TempSrc:  Oral Oral Oral  Resp: 18 18 18 18   Height:      Weight:       FHR reactive, Category I UCs every every 3-5 minutes  Dilation: Closed Effacement (%): 50 Station: -3 Presentation: Vertex Exam by:: Jimmye Norman, CNM  Cervix very soft, but still posterior and has some length to it   Will do one more dose of Cytotec

## 2014-12-11 NOTE — Progress Notes (Signed)
IFSE REMOVED WITH TIP INTACT

## 2014-12-11 NOTE — Progress Notes (Signed)
Patient ID: Lynn Burnett, female   DOB: 02-05-1981, 34 y.o.   MRN: [id number] Progressed suddenly to frequent painful contractions Crying with contractions, requesting c/s  FHR stable and reactive UCs every 1- 2 min with little resting time between  Cervix 7/100/0/vertex  Terbutaline given to relax uterus a bit  Epidural ordered and administered  Now has good relief with pressure only .   Will await complete dilation and labor down prior to pushing.

## 2014-12-11 NOTE — Progress Notes (Signed)
I stopped to check on patients need I ordered her breakfast, snack and dinner  By Juliann Mule Spanish Interpreter

## 2014-12-11 NOTE — Progress Notes (Signed)
I stopped to check on patients meals, dinner was delivered, by Juliann Mule Spanish Interpreter

## 2014-12-11 NOTE — Anesthesia Preprocedure Evaluation (Signed)
Anesthesia Evaluation  Patient identified by MRN, date of birth, ID band Patient awake    Reviewed: Allergy & Precautions, NPO status , Patient's Chart, lab work & pertinent test results  History of Anesthesia Complications Negative for: history of anesthetic complications  Airway Mallampati: II  TM Distance: >3 FB Neck ROM: Full    Dental no notable dental hx. (+) Dental Advisory Given   Pulmonary neg pulmonary ROS,  breath sounds clear to auscultation  Pulmonary exam normal       Cardiovascular negative cardio ROS  Rhythm:Regular Rate:Normal     Neuro/Psych negative neurological ROS  negative psych ROS   GI/Hepatic negative GI ROS, Neg liver ROS,   Endo/Other  diabetes, Type 2, Oral Hypoglycemic Agentsobesity  Renal/GU negative Renal ROS  negative genitourinary   Musculoskeletal negative musculoskeletal ROS (+)   Abdominal   Peds negative pediatric ROS (+)  Hematology negative hematology ROS (+)   Anesthesia Other Findings   Reproductive/Obstetrics (+) Pregnancy                             Anesthesia Physical Anesthesia Plan  ASA: II  Anesthesia Plan: Epidural   Post-op Pain Management:    Induction:   Airway Management Planned:   Additional Equipment:   Intra-op Plan:   Post-operative Plan:   Informed Consent: I have reviewed the patients History and Physical, chart, labs and discussed the procedure including the risks, benefits and alternatives for the proposed anesthesia with the patient or authorized representative who has indicated his/her understanding and acceptance.   Dental advisory given  Plan Discussed with:   Anesthesia Plan Comments:         Anesthesia Quick Evaluation

## 2014-12-11 NOTE — Anesthesia Postprocedure Evaluation (Signed)
  Anesthesia Post-op Note  Patient: Lynn Burnett  Procedure(s) Performed: * No procedures listed *  Patient Location: PACU  Anesthesia Type:Epidural  Level of Consciousness: awake  Airway and Oxygen Therapy: Patient Spontanous Breathing  Post-op Pain: none  Post-op Assessment: Post-op Vital signs reviewed, Patient's Cardiovascular Status Stable, Respiratory Function Stable, Patent Airway, No signs of Nausea or vomiting, Adequate PO intake, Pain level controlled, No headache, No backache, No residual numbness and No residual motor weakness  Post-op Vital Signs: Reviewed and stable  Last Vitals:  Filed Vitals:   12/11/14 1210  BP: 95/59  Pulse: 71  Temp: 36.8 C  Resp: 18    Complications: No apparent anesthesia complications

## 2014-12-11 NOTE — Progress Notes (Signed)
Dr Roselie Awkward called per Dr Deniece Ree to evaluate pt

## 2014-12-11 NOTE — Progress Notes (Signed)
Epidural catheter removed with tip intact.

## 2014-12-11 NOTE — Lactation Note (Signed)
This note was copied from the chart of [place]. Lactation Consultation Note  Patient Name: Boy Chanteria Haggard STMHD'Q Date: 12/11/2014 Reason for consult: Initial assessment of this mom and baby at 16 hours pp.  This mom is holding baby STS and baby asleep but baby has breastfed twice since delivery and this mom states that her older child breastfed for 2 years without difficulty.  Mom speaks English and states that she knows how to hand express her colostrum.  LC encouraged frequent STS and cue feedings but reassured mom that newborn sleepiness is normal during first 24 hours. Mom encouraged to feed baby 8-12 times/24 hours and with feeding cues. LC encouraged review of Baby and Me pp 9, 14 and 20-25 for STS and BF information.[place] provided [employer] Resource brochure in [place], and reviewed [place] services and list of community and web site resources, especially [employer] website which has information available in [place]..    Maternal Data Formula Feeding for Exclusion: No Has patient been taught Hand Expression?: Yes (experienced breastfeeding mom; states that she knows how to perform breast massage and hand expression) Does the patient have breastfeeding experience prior to this delivery?: Yes  Feeding    LATCH Score/Interventions               LATCH scores of 9 and 10 per RN assessment with this experienced breastfeeding mom       Lactation Tools Discussed/Used   STS, cue feedings, hand expression  Consult Status Consult Status: PRN Date: 12/12/14 Follow-up type: In-patient    Junious Dresser [facility] 12/11/2014, 6:21 PM

## 2014-12-12 ENCOUNTER — Inpatient Hospital Stay (HOSPITAL_COMMUNITY): Admission: RE | Admit: 2014-12-12 | Payer: Self-pay | Source: Ambulatory Visit

## 2014-12-12 LAB — CBC
HEMATOCRIT: 26.7 % — AB (ref 36.0–46.0)
HEMOGLOBIN: 9.4 g/dL — AB (ref 12.0–15.0)
MCH: 35.6 pg — ABNORMAL HIGH (ref 26.0–34.0)
MCHC: 35.2 g/dL (ref 30.0–36.0)
MCV: 101.1 fL — ABNORMAL HIGH (ref 78.0–100.0)
Platelets: 129 10*3/uL — ABNORMAL LOW (ref 150–400)
RBC: 2.64 MIL/uL — ABNORMAL LOW (ref 3.87–5.11)
RDW: 14.1 % (ref 11.5–15.5)
WBC: 9.5 10*3/uL (ref 4.0–10.5)

## 2014-12-12 LAB — GLUCOSE, CAPILLARY: GLUCOSE-CAPILLARY: 101 mg/dL — AB (ref 70–99)

## 2014-12-12 MED ORDER — IBUPROFEN 600 MG PO TABS: 600.0000 mg | ORAL_TABLET | Freq: Four times a day (QID) | ORAL | Status: DC

## 2014-12-12 MED ORDER — SENNOSIDES-DOCUSATE SODIUM 8.6-50 MG PO TABS: 2.0000 | ORAL_TABLET | ORAL | Status: DC

## 2014-12-12 NOTE — Progress Notes (Signed)
I checked on patient's needs and ordered her lunch. Normal  Interpreter.

## 2014-12-12 NOTE — Discharge Instructions (Signed)

## 2014-12-12 NOTE — Discharge Summary (Signed)
  Obstetric Discharge Summary Reason for Admission: induction of labor following oligohydramnios (AFI of 5cm noted in clinic down from 9.3 cm last week) Prenatal Procedures: none Intrapartum Procedures: vacuum Postpartum Procedures: none Complications-Operative and Postpartum: none  Delivery Note At 8:56 AM a viable female was delivered via Vaginal, Vacuum (Extractor) (Presentation: Middle Occiput Anterior).  APGAR: , 9; weight 7 lb 5.3 oz (3325 g).   Placenta status: Intact, Spontaneous.  Cord: 3 vessels with the following complications: None.  Anesthesia: Epidural  Instruments: [place] Vacuum Episiotomy: None Lacerations: 2nd degree;Perineal Suture Repair: 3.0 monocryl Est. Blood Loss (mL): 400  Mom to postpartum.  Baby to Couplet care / Skin to Skin.  Trinna Post 12/12/2014, 7:46 AM     Hospital Course:  Active Problems:   Oligohydramnios   Diabetes mellitus affecting pregnancy in third trimester   Lynn Burnett is a 34 y.o. [id number] s/p VAVD following IOL 2/2 oligohydramnios, hx of A2/B, previous SVD with 4th degree episiotomy.  Patient was admitted to L&D.  She has postpartum course that was uncomplicated including no problems with ambulating, PO intake, urination, pain, or bleeding. The pt feels ready to go home and will be discharged with outpatient follow-up.   Today: No acute events overnight.  Pt denies problems with ambulating, voiding or po intake.  She denies nausea or vomiting.  Pain is well controlled with Motrin.  She has had flatus. She has not had bowel movement.  Lochia Minimal.  Plan for birth control is  condoms.  Method of Feeding: Breast   H/H: Lab Results  Component Value Date/Time   HGB 9.4* 12/12/2014 05:43 AM   HCT 26.7* 12/12/2014 05:43 AM    Discharge Diagnoses: Term Pregnancy-delivered  Discharge Information: Date: 12/12/2014 Activity: pelvic rest Diet: routine  Medications: Motrin Breast feeding:  Yes Condition:  stable Instructions: refer to handout Discharge to: home      Medication List    STOP taking these medications        glyBURIDE 2.5 MG tablet  Commonly known as:  DIABETA      TAKE these medications        aspirin 81 MG chewable tablet  Chew 81 mg by mouth daily.     ibuprofen 600 MG tablet  Commonly known as:  ADVIL,MOTRIN  Take 1 tablet (600 mg total) by mouth every 6 (six) hours.     Phenylephrine-DM-GG-APAP 5-10-100-325 MG Tabs  Take 2 tablets by mouth 2 (two) times daily as needed (For cold symptoms.).     prenatal multivitamin Tabs tablet  Take 1 tablet by mouth daily.     senna-docusate 8.6-50 MG per tablet  Commonly known as:  Senokot-S  Take 2 tablets by mouth daily.         Trinna Post, MS3  12/12/2014,8:57 AM

## 2014-12-12 NOTE — Progress Notes (Signed)
Ur chart review completed.  

## 2014-12-12 NOTE — Lactation Note (Addendum)
This note was copied from the chart of [place]. Lactation Consultation Note  Larena Glassman RN assisting w/ discharge teaching including monitoring voids/stools. Reviewed engorgement care.  Encouraged her to call if she has questions. Mom encouraged to feed baby 8-12 times/24 hours and with feeding cues.    Patient Name: Boy Khiana Camino MEQAS'T Date: 12/12/2014 Reason for consult: Follow-up assessment   Maternal Data    Feeding Feeding Type: Breast Fed Length of feed: 45 min  LATCH Score/Interventions                      Lactation Tools Discussed/Used     Consult Status Consult Status: Complete    Carlye Grippe 12/12/2014, 9:47 AM

## 2014-12-19 ENCOUNTER — Encounter (HOSPITAL_COMMUNITY): Payer: Self-pay | Admitting: *Deleted

## 2014-12-27 ENCOUNTER — Encounter: Payer: Self-pay | Admitting: Obstetrics & Gynecology

## 2015-01-23 ENCOUNTER — Ambulatory Visit (INDEPENDENT_AMBULATORY_CARE_PROVIDER_SITE_OTHER): Payer: Self-pay | Admitting: Obstetrics & Gynecology

## 2015-01-23 VITALS — BP 110/65 | HR 69 | Temp 98.0°F | Wt 155.4 lb

## 2015-01-23 DIAGNOSIS — Z8632 Personal history of gestational diabetes: Secondary | ICD-10-CM

## 2015-01-23 NOTE — Progress Notes (Signed)
Patient ID: Aleese Kamps, female   DOB: 1981/08/01, 34 y.o.   MRN: [id number] Subjective:     Leen Tworek is a 34 y.o. G51P2012 female who presents for a postpartum visit. Patient is Spanish-speaking only, Spanish interpreter present for this encounter. She is 6 weeks postpartum following a vacuum delivery. I have fully reviewed the prenatal and intrapartum course. The delivery was at 54 gestational weeks after induction for A2/B GDM diagnosed in first trimester. Outcome: vacuum delivery due to FHR decelerations. Anesthesia: epidural. Postpartum course has been uncomplicated. Baby's course has been uncomplicated. Baby is feeding by breast. Bleeding no bleeding. Bowel function is normal. Bladder function is normal. Patient is not sexually active. Contraception method is condoms. Postpartum depression screening: negative.  The following portions of the patient's history were reviewed and updated as appropriate: allergies, current medications, past family history, past medical history, past social history, past surgical history and problem list. Last pap smear was in 2012.  Review of Systems A comprehensive review of systems was negative.   Objective:    BP 110/65 mmHg  Pulse 69  Temp(Src) 98 F (36.7 C)  Wt 155 lb 6.4 oz (70.489 kg)  General:  alert and no distress   Breasts:  inspection negative, no nipple discharge or bleeding, no masses or nodularity palpable  Lungs: clear to auscultation bilaterally  Heart:  regular rate and rhythm  Abdomen: soft, non-tender; bowel sounds normal; no masses,  no organomegaly   Vulva:  normal  Vagina: normal vagina  Cervix:  cervical motion tenderness, multiparous appearance and no cervical motion tenderness  Corpus: normal size, contour, position, consistency, mobility, non-tender  Adnexa:  normal adnexa and no mass, fullness, tenderness  Rectal Exam: Normal rectovaginal exam        Assessment:   Normal postpartum exam. Pap smear not done  at today's visit.   Plan:   1. Contraception: condoms 2. Will get pap smear at [facility] 3. Return for 2 hr GTT 4. Follow up as needed.   Verita Schneiders, MD, [place] Attending [place] for [employer], [place]

## 2015-01-23 NOTE — Patient Instructions (Signed)
Return to clinic for any obstetric concerns or go to MAU for evaluation  

## 2015-01-29 ENCOUNTER — Other Ambulatory Visit: Payer: Self-pay

## 2015-01-29 DIAGNOSIS — O24439 Gestational diabetes mellitus in the puerperium, unspecified control: Secondary | ICD-10-CM

## 2015-01-30 ENCOUNTER — Telehealth: Payer: Self-pay | Admitting: General Practice

## 2015-01-30 LAB — GLUCOSE TOLERANCE, 2 HOURS
Glucose, 2 hour: 136 mg/dL (ref 70–139)
Glucose, Fasting: 88 mg/dL (ref 70–99)

## 2015-01-30 NOTE — Telephone Encounter (Signed)
Called patient with [facility] for interpreter, no answer- left message stating we are trying to reach you, please call us back at the clinics

## 2015-01-30 NOTE — Telephone Encounter (Signed)
-----   Message from Truett Mainland, DO sent at 01/30/2015  9:48 AM EDT ----- 2hr GTT is normal - please let pt know.

## 2015-02-05 ENCOUNTER — Encounter: Payer: Self-pay | Admitting: General Practice

## 2015-02-05 NOTE — Telephone Encounter (Signed)
Called patient with [place] interpreter [phone], no answer- left message stating we are trying to reach you with results, please call us back at the clinics. Will send letter

## 2016-06-30 ENCOUNTER — Encounter (HOSPITAL_COMMUNITY): Payer: Self-pay | Admitting: Emergency Medicine

## 2016-06-30 ENCOUNTER — Emergency Department (HOSPITAL_COMMUNITY)
Admission: EM | Admit: 2016-06-30 | Discharge: 2016-06-30 | Disposition: A | Discharge status: Home / Self Care | Payer: Self-pay | Attending: Emergency Medicine | Admitting: Emergency Medicine

## 2016-06-30 DIAGNOSIS — R1033 Periumbilical pain: Secondary | ICD-10-CM

## 2016-06-30 DIAGNOSIS — K429 Umbilical hernia without obstruction or gangrene: Secondary | ICD-10-CM | POA: Insufficient documentation

## 2016-06-30 DIAGNOSIS — R110 Nausea: Secondary | ICD-10-CM

## 2016-06-30 LAB — CBC WITH DIFFERENTIAL/PLATELET
Basophils Absolute: 0 10*3/uL (ref 0.0–0.1)
Basophils Relative: 0 %
Eosinophils Absolute: 0 10*3/uL (ref 0.0–0.7)
Eosinophils Relative: 0 %
HEMATOCRIT: 40.7 % (ref 36.0–46.0)
HEMOGLOBIN: 14 g/dL (ref 12.0–15.0)
LYMPHS ABS: 1 10*3/uL (ref 0.7–4.0)
Lymphocytes Relative: 11 %
MCH: 31.9 pg (ref 26.0–34.0)
MCHC: 34.4 g/dL (ref 30.0–36.0)
MCV: 92.7 fL (ref 78.0–100.0)
MONO ABS: 0.4 10*3/uL (ref 0.1–1.0)
Monocytes Relative: 4 %
NEUTROS ABS: 8 10*3/uL — AB (ref 1.7–7.7)
NEUTROS PCT: 85 %
Platelets: 189 10*3/uL (ref 150–400)
RBC: 4.39 MIL/uL (ref 3.87–5.11)
RDW: 12.8 % (ref 11.5–15.5)
WBC: 9.4 10*3/uL (ref 4.0–10.5)

## 2016-06-30 LAB — URINALYSIS, ROUTINE W REFLEX MICROSCOPIC
BILIRUBIN URINE: NEGATIVE
GLUCOSE, UA: NEGATIVE mg/dL
HGB URINE DIPSTICK: NEGATIVE
KETONES UR: NEGATIVE mg/dL
Leukocytes, UA: NEGATIVE
Nitrite: NEGATIVE
PH: 7.5 (ref 5.0–8.0)
Protein, ur: NEGATIVE mg/dL
SPECIFIC GRAVITY, URINE: 1.01 (ref 1.005–1.030)

## 2016-06-30 LAB — COMPREHENSIVE METABOLIC PANEL
ALK PHOS: 60 U/L (ref 38–126)
ALT: 25 U/L (ref 14–54)
AST: 25 U/L (ref 15–41)
Albumin: 3.7 g/dL (ref 3.5–5.0)
Anion gap: 6 (ref 5–15)
BUN: 10 mg/dL (ref 6–20)
CALCIUM: 8.4 mg/dL — AB (ref 8.9–10.3)
CO2: 23 mmol/L (ref 22–32)
CREATININE: 0.41 mg/dL — AB (ref 0.44–1.00)
Chloride: 111 mmol/L (ref 101–111)
Glucose, Bld: 99 mg/dL (ref 65–99)
Potassium: 4.3 mmol/L (ref 3.5–5.1)
Sodium: 140 mmol/L (ref 135–145)
Total Bilirubin: 0.7 mg/dL (ref 0.3–1.2)
Total Protein: 6.6 g/dL (ref 6.5–8.1)

## 2016-06-30 LAB — I-STAT BETA HCG BLOOD, ED (MC, WL, AP ONLY): I-stat hCG, quantitative: <5 m[IU]/mL (ref <5)

## 2016-06-30 LAB — LIPASE, BLOOD: LIPASE: 28 U/L (ref 11–51)

## 2016-06-30 MED ORDER — SODIUM CHLORIDE 0.9 % IV BOLUS (SEPSIS)
1000.0000 mL | Freq: Once | INTRAVENOUS | Status: AC
  Administered 2016-06-30: 1000 mL via INTRAVENOUS

## 2016-06-30 MED ORDER — POLYETHYLENE GLYCOL 3350 17 G PO PACK
17.0000 g | PACK | Freq: Every day | ORAL | 0 refills | Status: DC | PRN
Start: 2016-06-30 — End: 2017-11-02

## 2016-06-30 MED ORDER — ONDANSETRON HCL 8 MG PO TABS: 8.0000 mg | ORAL_TABLET | Freq: Three times a day (TID) | ORAL | 0 refills | Status: DC | PRN

## 2016-06-30 MED ORDER — MORPHINE SULFATE (PF) 4 MG/ML IV SOLN
4.0000 mg | Freq: Once | INTRAVENOUS | Status: AC
  Administered 2016-06-30: 4 mg via INTRAVENOUS
  Filled 2016-06-30: qty 1

## 2016-06-30 MED ORDER — ONDANSETRON HCL 4 MG/2ML IJ SOLN
4.0000 mg | Freq: Once | INTRAMUSCULAR | Status: AC
  Administered 2016-06-30: 4 mg via INTRAVENOUS
  Filled 2016-06-30: qty 2

## 2016-06-30 NOTE — Discharge Instructions (Signed)
Your abdominal pain was related to a hernia you have in the muscles near your belly button. You will need to have surgery to repair this hernia. Avoid getting constipated, take miralax as directed as needed for constipation, and increase your water and fiber intake to ensure that your stools are soft and you don't need to strain. Use tylenol or motrin as needed for pain. Use zofran as directed as needed for nausea. Follow up with the surgeon in 1 week for recheck of symptoms and ongoing management of your hernia. Return to the ER for changes or worsening symptoms.

## 2016-06-30 NOTE — Progress Notes (Signed)
EDCM spoke to patient at bedside. Patient confirms she does not have a pcp or insurance living in [place].  [facility] provided patient with contact information to [facility], informed patient of services there and walk in times.  EDCM also provided patient with list of pcps who accept self pay patients, list of discount pharmacies and websites needymeds.org and GoodRX.com for medication assistance, phone number to inquire about the orange card, phone number to inquire about Medicaid, phone number to inquire about the [place], financial resources in the community such as local churches, [employer], urban ministries, and dental assistance for uninsured patients.  Patient thankful for resources.  No further EDCM needs at this time.

## 2016-06-30 NOTE — ED Triage Notes (Signed)
Patient here from home with complaints of abd pain around the belly button, cramping. Denies nausea, vomiting, diarrhea.

## 2016-06-30 NOTE — ED Notes (Signed)
Patient was alert, oriented and stable upon discharge. RN went over AVS and patient had no further questions.  

## 2016-06-30 NOTE — ED Notes (Signed)
Pt made aware of need for urine specimen 

## 2016-06-30 NOTE — ED Provider Notes (Signed)
[facility] DEPT Provider Note   CSN: [id number] Arrival date & time: 06/30/16  1433     History   Chief Complaint Chief Complaint  Patient presents with  . Abdominal Pain    HPI Lynn Burnett is a 35 y.o. female with a PMHx of gestational diabetes, who presents to the ED with complaints of sudden onset periumbilical abdominal pain that began around 10:30 AM when she was eating a piece of bread and some milk. Patient states that she has had similar episodes of pain in the past, most notably on 01/20/14 when she came to the ER, workup at that time was negative including a CT abdomen and pelvis which was unremarkable. She states that over the years she has had similar symptoms but it usually self resolves after several hours. This time it has persisted which is why she came to the ER. She describes the pain is 8/10 constant "pulling" in the periumbilical area, nonradiating, worse with "everything" and improved with Tylenol. She has noticed that her umbilical area is swollen and appears bruised. Associated symptoms include nausea. She has not eaten anything aside from the bread and milk at 10:30 AM.  She denies any fevers, chills, chest pain, shortness breath, vomiting, diarrhea, constipation, melena, hematochezia, obstipation, dysuria, hematuria, vaginal bleeding or discharge, numbness, tingling, focal weakness, recent travel, sick contacts, suspicious food intake, alcohol use, chronic NSAID use, or prior abdominal surgeries. LMP 06/04/16.   The history is provided by the patient and medical records. No language interpreter was used.  Abdominal Pain   This is a recurrent problem. The current episode started 3 to 5 hours ago. The problem occurs constantly. The problem has been gradually worsening. The pain is associated with eating. The pain is located in the periumbilical region. Quality: pulling. The pain is at a severity of 8/10. The pain is moderate. Associated symptoms include nausea.  Pertinent negatives include fever, diarrhea, flatus, hematochezia, melena, vomiting, constipation, dysuria, hematuria, arthralgias and myalgias. Exacerbated by: "everything" Nothing relieves the symptoms. Past workup includes CT scan (which was neg 01/20/14).    Past Medical History:  Diagnosis Date  . Diabetes mellitus    prediabetes per MD no meds    Patient Active Problem List   Diagnosis Date Noted  . History of gestational diabetes 03/01/2012    No past surgical history on file.  OB History    Gravida Para Term Preterm AB Living   3 2 2  0 1 2   SAB TAB Ectopic Multiple Live Births   1 0 0 0 2       Home Medications    Prior to Admission medications   Medication Sig Start Date End Date Taking? Authorizing Provider  oseltamivir (TAMIFLU) 75 MG capsule Take 75 mg by mouth.    Historical Provider, MD  Prenatal Vit-Fe Fumarate-FA (PRENATAL MULTIVITAMIN) TABS tablet Take 1 tablet by mouth daily. 11/20/14   Woodroe Mode, MD  senna-docusate (SENOKOT-S) 8.6-50 MG per tablet Take 2 tablets by mouth daily. 12/12/14   Lorna Few, DO    Family History Family History  Problem Relation Age of Onset  . Cancer Mother     cervical  . Miscarriages / Stillbirths Mother   . Cancer Maternal Aunt     Social History Social History  Substance Use Topics  . Smoking status: Never Smoker  . Smokeless tobacco: Never Used  . Alcohol use No     Allergies   Review of patient's allergies indicates no known allergies.  Review of Systems Review of Systems  Constitutional: Negative for chills and fever.  Respiratory: Negative for shortness of breath.   Cardiovascular: Negative for chest pain.  Gastrointestinal: Positive for abdominal distention (swollen umbilical area which is discolored), abdominal pain and nausea. Negative for blood in stool, constipation, diarrhea, flatus, hematochezia, melena and vomiting.  Genitourinary: Negative for dysuria, hematuria, vaginal bleeding and  vaginal discharge.  Musculoskeletal: Negative for arthralgias and myalgias.  Skin: Positive for color change (discolored umbilical region).  Allergic/Immunologic: Negative for immunocompromised state.  Neurological: Negative for weakness and numbness.  Psychiatric/Behavioral: Negative for confusion.   10 Systems reviewed and are negative for acute change except as noted in the HPI.   Physical Exam Updated Vital Signs BP 114/80 (BP Location: Right Arm)   Pulse 67   Temp 98.8 F (37.1 C) (Oral)   Resp 14   SpO2 98%   Physical Exam  Constitutional: She is oriented to person, place, and time. Vital signs are normal. She appears well-developed and well-nourished.  Non-toxic appearance. No distress.  Afebrile, nontoxic, NAD, although appears uncomfortable  HENT:  Head: Normocephalic and atraumatic.  Mouth/Throat: Oropharynx is clear and moist and mucous membranes are normal.  Eyes: Conjunctivae and EOM are normal. Right eye exhibits no discharge. Left eye exhibits no discharge.  Neck: Normal range of motion. Neck supple.  Cardiovascular: Normal rate, regular rhythm, normal heart sounds and intact distal pulses.  Exam reveals no gallop and no friction rub.   No murmur heard. Pulmonary/Chest: Effort normal and breath sounds normal. No respiratory distress. She has no decreased breath sounds. She has no wheezes. She has no rhonchi. She has no rales.  Abdominal: Soft. Normal appearance and bowel sounds are normal. She exhibits no distension. There is generalized tenderness and tenderness in the periumbilical area. There is guarding (mild voluntary). There is no rigidity, no rebound, no CVA tenderness, no tenderness at McBurney's point and negative Murphy's sign. A hernia (umbilical) is present.    Soft, nondistended, +BS throughout, with mild generalized TTP and exquisite periumbilical TTP, umbilical hernia with incarcerated bowel within the hernia, appears bruised/discolored, unable to reduce  manually and causes significant pain when attempting to reduce, some mild voluntary guarding, no rigidity or rebound TTP, neg murphy's, neg mcburney's, no CVA TTP   Musculoskeletal: Normal range of motion.  Neurological: She is alert and oriented to person, place, and time. She has normal strength. No sensory deficit.  Skin: Skin is warm, dry and intact. No rash noted.  Psychiatric: She has a normal mood and affect.  Nursing note and vitals reviewed.    ED Treatments / Results  Labs (all labs ordered are listed, but only abnormal results are displayed) Labs Reviewed  CBC WITH DIFFERENTIAL/PLATELET - Abnormal; Notable for the following:       Result Value   Neutro Abs 8.0 (*)    All other components within normal limits  URINALYSIS, ROUTINE W REFLEX MICROSCOPIC (NOT AT [facility]) - Abnormal; Notable for the following:    APPearance CLOUDY (*)    All other components within normal limits  COMPREHENSIVE METABOLIC PANEL - Abnormal; Notable for the following:    Creatinine, Ser 0.41 (*)    Calcium 8.4 (*)    All other components within normal limits  LIPASE, BLOOD  I-STAT BETA HCG BLOOD, ED ([facility], [facility], [facility] ONLY)    EKG  EKG Interpretation None       Radiology No results found.  Procedures Procedures (including critical care time)  Medications  Ordered in ED Medications  morphine 4 MG/ML injection 4 mg (4 mg Intravenous Given 06/30/16 1531)  ondansetron (ZOFRAN) injection 4 mg (4 mg Intravenous Given 06/30/16 1531)  sodium chloride 0.9 % bolus 1,000 mL (0 mLs Intravenous Stopped 06/30/16 1728)     Initial Impression / Assessment and Plan / ED Course  I have reviewed the triage vital signs and the nursing notes.  Pertinent labs & imaging results that were available during my care of the patient were reviewed by me and considered in my medical decision making (see chart for details).  Clinical Course    35 y.o. female here with periumbilical pain [id number] sudden onset after eating,  has had similar symptoms in the past, work up 12/2013 was neg, but states it intermittently happens and then resolves but this time hasn't gone down, and now her umbilical region is swollen and bruised appearing. On exam, periumbilical TTP with +hernia and audible bowel sounds within the hernia, definite incarcerated loops of bowel which cannot be reduced. Some mild generalized TTP in all quadrants as well, adequate bowel sounds in all other areas. Afebrile and nontoxic, but appears uncomfortable. Will get labs, give pain meds and nausea meds with fluids, and get CT abd/pelv, and then after pain meds given will reattempt reduction of incarcerated bowel, if unable to reduce then will need to consult surgery for reduction/repair. Discussed case with my attending Dr. Regenia Skeeter who agrees with plan. Will reassess shortly  3:09 PM Dr. Regenia Skeeter able to reduce hernia after some more constant pressure applied, area no longer discolored. Will cancel CT abd/pelv for now, and await labs. Will reassess shortly  6:30 PM Several delays getting labs done, unclear what happened but they have finally all resulted. CBC w/diff WNL. CMPWNL. Lipase WNL. U/A WNL. HCG neg. Pt feeling much better, no longer nauseated or in pain, and hernia has not re-incarcerated. Will need surgery referral for outpatient management. Will rx miralax to take as needed for constipation, discussed fiber/water intake to ensure she doesn't have to strain for BMs. Rx zofran for nausea. F/up with surgery in 1wk for recheck and ongoing management of her hernia. I explained the diagnosis and have given explicit precautions to return to the ER including for any other new or worsening symptoms. The patient understands and accepts the medical plan as it's been dictated and I have answered their questions. Discharge instructions concerning home care and prescriptions have been given. The patient is STABLE and is discharged to home in good condition.   Final  Clinical Impressions(s) / ED Diagnoses   Final diagnoses:  Umbilical hernia without obstruction and without gangrene  Periumbilical pain  Nausea    New Prescriptions New Prescriptions   ONDANSETRON (ZOFRAN) 8 MG TABLET    Take 1 tablet (8 mg total) by mouth every 8 (eight) hours as needed for nausea or vomiting.   POLYETHYLENE GLYCOL (MIRALAX / GLYCOLAX) PACKET    Take 17 g by mouth daily as needed for mild constipation or moderate constipation.     Jayson Waterhouse Camprubi-Soms, PA-C 06/30/16 1837    Sherwood Gambler, MD 07/02/16 1002

## 2016-07-10 ENCOUNTER — Ambulatory Visit: Payer: Self-pay | Admitting: General Surgery

## 2016-09-09 ENCOUNTER — Encounter (HOSPITAL_COMMUNITY): Payer: Self-pay

## 2017-08-26 ENCOUNTER — Inpatient Hospital Stay (HOSPITAL_COMMUNITY): Payer: Self-pay

## 2017-08-26 ENCOUNTER — Inpatient Hospital Stay (HOSPITAL_COMMUNITY)
Admission: AD | Admit: 2017-08-26 | Discharge: 2017-08-26 | Disposition: A | Discharge status: Home / Self Care | Payer: Self-pay | Source: Ambulatory Visit | Attending: Obstetrics & Gynecology | Admitting: Obstetrics & Gynecology

## 2017-08-26 ENCOUNTER — Encounter (HOSPITAL_COMMUNITY): Payer: Self-pay | Admitting: *Deleted

## 2017-08-26 DIAGNOSIS — K429 Umbilical hernia without obstruction or gangrene: Secondary | ICD-10-CM | POA: Insufficient documentation

## 2017-08-26 DIAGNOSIS — Z3A10 10 weeks gestation of pregnancy: Secondary | ICD-10-CM | POA: Insufficient documentation

## 2017-08-26 DIAGNOSIS — Z3491 Encounter for supervision of normal pregnancy, unspecified, first trimester: Secondary | ICD-10-CM

## 2017-08-26 DIAGNOSIS — O99611 Diseases of the digestive system complicating pregnancy, first trimester: Secondary | ICD-10-CM | POA: Insufficient documentation

## 2017-08-26 DIAGNOSIS — O468X1 Other antepartum hemorrhage, first trimester: Secondary | ICD-10-CM

## 2017-08-26 DIAGNOSIS — O418X10 Other specified disorders of amniotic fluid and membranes, first trimester, not applicable or unspecified: Secondary | ICD-10-CM

## 2017-08-26 DIAGNOSIS — O209 Hemorrhage in early pregnancy, unspecified: Secondary | ICD-10-CM | POA: Insufficient documentation

## 2017-08-26 HISTORY — DX: Umbilical hernia without obstruction or gangrene: K42.9

## 2017-08-26 LAB — URINALYSIS, ROUTINE W REFLEX MICROSCOPIC
BILIRUBIN URINE: NEGATIVE
Glucose, UA: NEGATIVE mg/dL
Hgb urine dipstick: NEGATIVE
KETONES UR: 5 mg/dL — AB
Nitrite: NEGATIVE
PH: 6 (ref 5.0–8.0)
Protein, ur: 30 mg/dL — AB
Specific Gravity, Urine: 1.027 (ref 1.005–1.030)

## 2017-08-26 LAB — HCG, QUANTITATIVE, PREGNANCY: hCG, Beta Chain, Quant, S: 41527 m[IU]/mL — ABNORMAL HIGH (ref <5)

## 2017-08-26 LAB — CBC
HCT: 39.6 % (ref 36.0–46.0)
Hemoglobin: 13.5 g/dL (ref 12.0–15.0)
MCH: 32.7 pg (ref 26.0–34.0)
MCHC: 34.1 g/dL (ref 30.0–36.0)
MCV: 95.9 fL (ref 78.0–100.0)
PLATELETS: 179 10*3/uL (ref 150–400)
RBC: 4.13 MIL/uL (ref 3.87–5.11)
RDW: 13.2 % (ref 11.5–15.5)
WBC: 7.3 10*3/uL (ref 4.0–10.5)

## 2017-08-26 LAB — WET PREP, GENITAL
Clue Cells Wet Prep HPF POC: NONE SEEN
Sperm: NONE SEEN
TRICH WET PREP: NONE SEEN
Yeast Wet Prep HPF POC: NONE SEEN

## 2017-08-26 LAB — POCT PREGNANCY, URINE: PREG TEST UR: POSITIVE — AB

## 2017-08-26 NOTE — MAU Provider Note (Signed)
History     CSN: [id number]  Arrival date and time: 08/26/17 1612   First Provider Initiated Contact with Patient 08/26/17 1708      Chief Complaint  Patient presents with  . Abdominal Pain   HPI  [name] is a 36 yr old female [phone] 10 wks who presents today with an umbilical pain and lower back pain. [name] she was diagnosed with an umbilical hernia after her first birth. Since then it has not given her many problems until she recently became pregnant again. Endorsed a 7/10 pain when she is up moving around and finds relief when supine. She currently is in no pain. Her lower back pain is minimal. Since last week she has been spotting dark red blood and states it is less than her normal period. States blood it in her panty liner and when she wipes. Last menstrual period was on Sept.10th.  Denies dysuria, diarrhea, abdominal pain, vaginal pain, recent trauma, and fever.    Past Medical History:  Diagnosis Date  . Diabetes mellitus    prediabetes per MD no meds  . No pertinent past medical history     Past Surgical History:  Procedure Laterality Date  . NO PAST SURGERIES      Family History  Problem Relation Age of Onset  . Cancer Mother        cervical  . Miscarriages / Stillbirths Mother   . Cancer Maternal Aunt   . Anesthesia problems Neg Hx     Social History   Tobacco Use  . Smoking status: Never Smoker  . Smokeless tobacco: Never Used  Substance Use Topics  . Alcohol use: No  . Drug use: No    Allergies: No Known Allergies  Medications Prior to Admission  Medication Sig Dispense Refill Last Dose  . acetaminophen (TYLENOL) 500 MG tablet Take 500 mg by mouth 2 (two) times daily as needed for mild pain.   06/30/2016 at Unknown time  . loratadine (CLARITIN) 10 MG tablet Take 10 mg by mouth daily as needed for allergies.   06/29/2016 at Unknown time  . Multiple Vitamins-Minerals (WOMENS MULTI VITAMIN & MINERAL) TABS Take 1 tablet by  mouth daily.   06/29/2016 at Unknown time  . ondansetron (ZOFRAN) 8 MG tablet Take 1 tablet (8 mg total) by mouth every 8 (eight) hours as needed for nausea or vomiting. 10 tablet 0   . polyethylene glycol (MIRALAX / GLYCOLAX) packet Take 17 g by mouth daily as needed for mild constipation or moderate constipation. 14 each 0   . Prenatal Vit-Fe Fumarate-FA (PRENATAL MULTIVITAMIN) TABS Take 1 tablet by mouth daily.   10/07/2011 at Unknown   Results for orders placed or performed during the hospital encounter of 08/26/17 (from the past 24 hour(s))  Urinalysis, Routine w reflex microscopic     Status: Abnormal   Collection Time: 08/26/17  4:33 PM  Result Value Ref Range   Color, Urine YELLOW YELLOW   APPearance CLOUDY (A) CLEAR   Specific Gravity, Urine 1.027 1.005 - 1.030   pH 6.0 5.0 - 8.0   Glucose, UA NEGATIVE NEGATIVE mg/dL   Hgb urine dipstick NEGATIVE NEGATIVE   Bilirubin Urine NEGATIVE NEGATIVE   Ketones, ur 5 (A) NEGATIVE mg/dL   Protein, ur 30 (A) NEGATIVE mg/dL   Nitrite NEGATIVE NEGATIVE   Leukocytes, UA SMALL (A) NEGATIVE   RBC / HPF 0-5 0 - 5 RBC/hpf   WBC, UA 6-30 0 - 5 WBC/hpf  Bacteria, UA MANY (A) NONE SEEN   Squamous Epithelial / LPF TOO NUMEROUS TO COUNT (A) NONE SEEN   Mucus PRESENT   Pregnancy, urine POC     Status: Abnormal   Collection Time: 08/26/17  4:42 PM  Result Value Ref Range   Preg Test, Ur POSITIVE (A) NEGATIVE  CBC     Status: None   Collection Time: 08/26/17  5:20 PM  Result Value Ref Range   WBC 7.3 4.0 - 10.5 K/uL   RBC 4.13 3.87 - 5.11 MIL/uL   Hemoglobin 13.5 12.0 - 15.0 g/dL   HCT 39.6 36.0 - 46.0 %   MCV 95.9 78.0 - 100.0 fL   MCH 32.7 26.0 - 34.0 pg   MCHC 34.1 30.0 - 36.0 g/dL   RDW 13.2 11.5 - 15.5 %   Platelets 179 150 - 400 K/uL  hCG, quantitative, pregnancy     Status: Abnormal   Collection Time: 08/26/17  5:20 PM  Result Value Ref Range   hCG, Beta Chain, Quant, S 41,527 (H) <5 mIU/mL  Wet prep, genital     Status: Abnormal    Collection Time: 08/26/17  5:34 PM  Result Value Ref Range   Yeast Wet Prep HPF POC NONE SEEN NONE SEEN   Trich, Wet Prep NONE SEEN NONE SEEN   Clue Cells Wet Prep HPF POC NONE SEEN NONE SEEN   WBC, Wet Prep HPF POC MODERATE (A) NONE SEEN   Sperm NONE SEEN      Review of Systems  All pertinent positives and negatives listed in HPI Physical Exam   Blood pressure 111/71, pulse 73, temperature 98.3 F (36.8 C), temperature source Oral, resp. rate 18, height 5\' 3"  (1.6 m), weight 71.2 kg (157 lb), last menstrual period 06/17/2017, SpO2 100 %, unknown if currently breastfeeding.  Physical Exam  Constitutional: She is oriented to person, place, and time. No distress.  HENT:  Head: Normocephalic.  Eyes: Pupils are equal, round, and reactive to light. Right eye exhibits no discharge. Left eye exhibits no discharge.  Cardiovascular: Normal rate, regular rhythm and normal heart sounds.  Respiratory: Effort normal and breath sounds normal.  GI: Soft. Bowel sounds are normal. She exhibits no distension. There is no tenderness. There is no rebound and no guarding.  Genitourinary: There is no rash, tenderness or lesion on the right labia. There is no rash, tenderness or lesion on the left labia. There is bleeding (brigh red blood noted on withdraw of speculum. On bimanuel no cervical tenderness noted.) in the vagina. Vaginal discharge (thin yellow discharge on speculum exam. Cervix is parus and friable without tenderness) found.  Neurological: She is alert and oriented to person, place, and time.  Skin: Skin is warm and dry. She is not diaphoretic.  Psychiatric: She has a normal mood and affect. Her behavior is normal.    MAU Course  Procedures None  MDM UA CBC US OB Transvaginal HCG quantitative  Wet Prep GC/Chlamydia screen  Assessment and Plan  1. Pregnancy of 1st trimester   -Informed patient of ultrasound results   - Discussed subchorionic hemorrhage finding on [place] and probable  cause of her               spotting. Also mentioned that spotting should decrease as pregnancy                             continues.  - Provided a list prenatal  providers in the area and confirmation of pregnancy              paperwork  -Discussed importance of prenatal care   2.Umbilical pain  - Patient has history of documented umbilical hernia with last pregnancy   - Informed patient we cannot do anything for hernia at this time and to avoid                      heavy lifting and/or prolonged straining.  - Discussed sign/symptoms  of incarcerated hernia and when to seek ED               attention   2. Lower back pain   - Discussed tylenol regimen   -Patient was discharged in stable condition  Raiya Stainback 08/26/2017, 5:09 PM

## 2017-08-26 NOTE — MAU Note (Signed)
Pt had positive HPt. Has Hx of umbilical hernia. Started having pain around that area again today and c/o back pain and nausea.Also reports some spotting for the past week

## 2017-08-26 NOTE — MAU Provider Note (Signed)
History     CSN: [id number]  Arrival date and time: 08/26/17 1612   First Provider Initiated Contact with Patient 08/26/17 1708      Chief Complaint  Patient presents with  . Abdominal Pain   HPI  Lynn Burnett is a 36 y.o. [phone] at [redacted]w[redacted]d who presents with abdominal pain, back pain, & vaginal bleeding.  Patient with history of umbilical hernia; was supposed to have surgery last year but cancelled d/t financial reasons. Reports mid abdominal pain around area of hernia since yesterday. Pain is not constant; occurs when trying to sit up.  Also reports low back pain that is achy. Rates pain 4/10. Has not treated pain. Pink/red spotting on toilet paper intermittently for the last week. Not bleeding into underwear or saturating pads.  Denies n/v/d, fever/chills, dysuria, vaginal discharge.   OB History    Gravida Para Term Preterm AB Living   4 2 2  0 1 2   SAB TAB Ectopic Multiple Live Births   1 0 0   2      Past Medical History:  Diagnosis Date  . Diabetes mellitus    prediabetes per MD no meds  . Umbilical hernia     Past Surgical History:  Procedure Laterality Date  . NO PAST SURGERIES      Family History  Problem Relation Age of Onset  . Cancer Mother        cervical  . Miscarriages / Stillbirths Mother   . Cancer Maternal Aunt   . Anesthesia problems Neg Hx     Social History   Tobacco Use  . Smoking status: Never Smoker  . Smokeless tobacco: Never Used  Substance Use Topics  . Alcohol use: No  . Drug use: No    Allergies: No Known Allergies  Medications Prior to Admission  Medication Sig Dispense Refill Last Dose  . acetaminophen (TYLENOL) 500 MG tablet Take 500 mg by mouth 2 (two) times daily as needed for mild pain.   06/30/2016 at Unknown time  . loratadine (CLARITIN) 10 MG tablet Take 10 mg by mouth daily as needed for allergies.   06/29/2016 at Unknown time  . Multiple Vitamins-Minerals (WOMENS MULTI VITAMIN & MINERAL) TABS Take 1 tablet  by mouth daily.   06/29/2016 at Unknown time  . ondansetron (ZOFRAN) 8 MG tablet Take 1 tablet (8 mg total) by mouth every 8 (eight) hours as needed for nausea or vomiting. 10 tablet 0   . polyethylene glycol (MIRALAX / GLYCOLAX) packet Take 17 g by mouth daily as needed for mild constipation or moderate constipation. 14 each 0   . Prenatal Vit-Fe Fumarate-FA (PRENATAL MULTIVITAMIN) TABS Take 1 tablet by mouth daily.   10/07/2011 at Unknown    Review of Systems  Constitutional: Negative.   Gastrointestinal: Positive for abdominal pain. Negative for constipation, diarrhea, nausea and vomiting.  Genitourinary: Positive for vaginal bleeding. Negative for dysuria and vaginal discharge.   Physical Exam   Blood pressure 111/71, pulse 73, temperature 98.3 F (36.8 C), temperature source Oral, resp. rate 18, height 5\' 3"  (1.6 m), weight 157 lb (71.2 kg), last menstrual period 06/17/2017, SpO2 100 %, unknown if currently breastfeeding.  Physical Exam  Nursing note and vitals reviewed. Constitutional: She is oriented to person, place, and time. She appears well-developed and well-nourished. No distress.  HENT:  Head: Normocephalic and atraumatic.  Eyes: Conjunctivae are normal. Right eye exhibits no discharge. Left eye exhibits no discharge. No scleral icterus.  Neck: Normal range  of motion.  Respiratory: Effort normal. No respiratory distress.  GI: Soft. Bowel sounds are normal. There is no tenderness. A hernia is present.  reducible umbilical hernia  Neurological: She is alert and oriented to person, place, and time.  Skin: Skin is warm and dry. She is not diaphoretic.  Psychiatric: She has a normal mood and affect. Her behavior is normal. Judgment and thought content normal.    MAU Course  Procedures Results for orders placed or performed during the hospital encounter of 08/26/17 (from the past 24 hour(s))  Urinalysis, Routine w reflex microscopic     Status: Abnormal   Collection Time:  08/26/17  4:33 PM  Result Value Ref Range   Color, Urine YELLOW YELLOW   APPearance CLOUDY (A) CLEAR   Specific Gravity, Urine 1.027 1.005 - 1.030   pH 6.0 5.0 - 8.0   Glucose, UA NEGATIVE NEGATIVE mg/dL   Hgb urine dipstick NEGATIVE NEGATIVE   Bilirubin Urine NEGATIVE NEGATIVE   Ketones, ur 5 (A) NEGATIVE mg/dL   Protein, ur 30 (A) NEGATIVE mg/dL   Nitrite NEGATIVE NEGATIVE   Leukocytes, UA SMALL (A) NEGATIVE   RBC / HPF 0-5 0 - 5 RBC/hpf   WBC, UA 6-30 0 - 5 WBC/hpf   Bacteria, UA MANY (A) NONE SEEN   Squamous Epithelial / LPF TOO NUMEROUS TO COUNT (A) NONE SEEN   Mucus PRESENT   Pregnancy, urine POC     Status: Abnormal   Collection Time: 08/26/17  4:42 PM  Result Value Ref Range   Preg Test, Ur POSITIVE (A) NEGATIVE  CBC     Status: None   Collection Time: 08/26/17  5:20 PM  Result Value Ref Range   WBC 7.3 4.0 - 10.5 K/uL   RBC 4.13 3.87 - 5.11 MIL/uL   Hemoglobin 13.5 12.0 - 15.0 g/dL   HCT 39.6 36.0 - 46.0 %   MCV 95.9 78.0 - 100.0 fL   MCH 32.7 26.0 - 34.0 pg   MCHC 34.1 30.0 - 36.0 g/dL   RDW 13.2 11.5 - 15.5 %   Platelets 179 150 - 400 K/uL  hCG, quantitative, pregnancy     Status: Abnormal   Collection Time: 08/26/17  5:20 PM  Result Value Ref Range   hCG, Beta Chain, Quant, S 41,527 (H) <5 mIU/mL  Wet prep, genital     Status: Abnormal   Collection Time: 08/26/17  5:34 PM  Result Value Ref Range   Yeast Wet Prep HPF POC NONE SEEN NONE SEEN   Trich, Wet Prep NONE SEEN NONE SEEN   Clue Cells Wet Prep HPF POC NONE SEEN NONE SEEN   WBC, Wet Prep HPF POC MODERATE (A) NONE SEEN   Sperm NONE SEEN     MDM +UPT UA, wet prep, GC/chlamydia, CBC, ABO/Rh, quant hCG, HIV, and [place] today to rule out ectopic pregnancy B positive Hernia reducible & CBC wnl; no evidence of incarceration or gangrene Minimal amount of blood on exam & cervix closed Ultrasound shows SIUP with cardiac activity & small [facility]  Assessment and Plan  A: 1. Subchorionic hematoma in first  trimester, single or unspecified fetus   2. Vaginal bleeding in pregnancy, first trimester   3. Normal IUP (intrauterine pregnancy) on prenatal ultrasound, first trimester   4. Umbilical hernia without obstruction and without gangrene    P: Discharge home Discussed reasons to return to MAU vs ED Avoid heavy lifting or straining Start prenatal care  GC/CT & urine culture pending  Jorje Guild 08/26/2017, 5:15 PM

## 2017-08-26 NOTE — Discharge Instructions (Signed)
Hematoma subcorinico (Subchorionic Hematoma) Un hematoma subcorinico es una acumulacin de sangre entre la pared externa de la placenta y la pared interna del la matriz (tero). La placenta es el rgano que conecta el feto a la pared del tero. La placenta realiza la funcin de alimentacin, respiracin (oxgeno al feto) y el trabajo de eliminacin de desechos (excrecin) del feto. Un hematoma subcorinico es la anormalidad ms frecuente encontrada en una ecografa durante el primer trimestre o principios del segundo trimestre del embarazo. Si ha habido poca o ninguna hemorragia vaginal, generalmente los pequeos hematomas se reducen por su propia cuenta y no afectan al beb ni al [place]. La sangre es absorbida gradualmente durante una o [place]. Cuando la hemorragia comienza ms tarde en el embarazo o el hematoma es ms grande o se produce en una paciente de edad avanzada, el resultado puede no ser tan bueno. Los grandes hematomas pueden agrandarse an ms y [place] las posibilidades de aborto espontneo. El hematoma subcorinico tambin aumenta el riesgo de desprendimiento precoz de la placenta del tero, muerte fetal y Environmental education officer. INSTRUCCIONES PARA EL CUIDADO EN EL HOGAR  Repose en cama si el mdico se lo recomienda. Aunque el reposo en cama no evitar la hemorragia o un aborto espontneo, su mdico puede recomendarlo.  Evite levantar objetos pesados (ms de 10 libras [4,5 kg]), hacer ejercicio, tener relaciones sexuales o realizar duchas vaginales segn se lo indique el profesional.  Lleve un registro de la cantidad y Energy manager de remojo (saturacin) de las toallas higinicas que Medical laboratory scientific officer. Anote esta informacin.  No use tampones.  Cumpla con todas las visitas de control, segn le indique su mdico. El profesional podr pedirle que se realice anlisis de seguimiento, pruebas de [place] o [employer].  SOLICITE ATENCIN MDICA DE INMEDIATO SI:  Siente calambres intensos en el  estmago, en la espalda, en el abdomen o en la pelvis.  Tiene fiebre.  Elimina cogulos o tejidos grandes. Guarde los tejidos para que su mdico los vea.  Si la hemorragia aumenta o siente mareos, debilidad o tiene episodios de [place].  Esta informacin no tiene Marine scientist el consejo del mdico. Asegrese de hacerle al mdico cualquier pregunta que tenga. Document Released: 12/31/2008 Document Revised: 07/05/2013 Document Reviewed: 04/13/2013 Elsevier Interactive Patient Education  2017 [place] umbilical en los adultos (Umbilical Hernia, Adult) Una hernia es una protrusin de tejido que sobresale a travs de una abertura [employer]. Una hernia umbilical se produce en el abdomen, cerca del ombligo. La hernia puede contener tejidos del intestino delgado, el intestino grueso o tejido graso que recubre el intestino (epipln). Las hernias [employer] en los adultos suelen empeorar con el tiempo y requieren tratamiento quirrgico. Hay varios tipos de hernias umbilicales. Puede tener:  Una hernia ubicada justo por debajo o por arriba del ombligo (hernia inguinal indirecta). Es el tipo de hernia umbilical ms frecuente en los adultos.  Una hernia que se forma a travs de una abertura hecha por el ombligo (hernia inguinal directa).  Una hernia que aparece y desaparece (hernia reducible). Una hernia reducible puede ser visible solo al hacer fuerza, levantar objetos pesados o toser. Este tipo de hernia se puede reintroducir en el abdomen (reducir).  Una hernia que aprisiona tejido abdominal (hernia encarcelada). Este tipo de hernia es irreducible.  Una hernia que interrumpe el flujo de sangre a los tejidos en su interior (hernia estrangulada). Si esto ocurre, los tejidos Radio broadcast assistant a Pharmacologist. Este tipo  de hernia requiere tratamiento de emergencia. CAUSAS Una hernia umbilical se produce cuando el tejido dentro del abdomen ejerce presin sobre una zona  debilitada de los msculos abdominales. FACTORES DE RIESGO Puede correr un mayor riesgo de tener esta afeccin en los siguientes casos:  Tiene obesidad.  Tuvo varios embarazos.  Tiene una acumulacin de lquido dentro del abdomen (ascitis).  Se someti a una ciruga que [employer] abdominales. SNTOMAS El principal sntoma de esta afeccin es un bulto en el ombligo o cerca de este que no causa dolor. Una hernia reducible puede ser visible solo al hacer fuerza, levantar objetos pesados o toser. Otros sntomas pueden incluir los siguientes:  Dolor sordo.  Sensacin de opresin. Los sntomas de una hernia estrangulada pueden incluir los siguientes:  Dolor que se vuelve cada vez ms intenso.  Nuseas y vmitos.  Dolor al ejercer presin sobre la hernia.  Cambio de color de la piel que recubre la hernia que se torna roja o violcea.  Estreimiento.  Sangre en la materia fecal (heces). DIAGNSTICO Esta afeccin se puede diagnosticar en funcin de lo siguiente:  Un examen fsico. Pueden pedirle que tosa o que haga fuerza mientras est de pie. Estas acciones aumentan la presin dentro del abdomen y empujan la hernia a travs de la abertura en los msculos. El mdico puede ejercer presin sobre la hernia para tratar de reducirla.  Los sntomas y antecedentes mdicos. TRATAMIENTO La ciruga es el nico tratamiento para una hernia umbilical. En el caso de que la hernia est estrangulada, esta se realiza lo antes posible. Si tiene una hernia pequea que no est encarcelada, tal vez tenga que bajar de peso antes de la [place]. INSTRUCCIONES PARA EL CUIDADO EN EL HOGAR  Baje de peso, si se lo indic el mdico.  No trate de reintroducir la hernia.  Observe si la hernia cambia de color o de tamao. Infrmele al mdico si se producen cambios.  Tal vez deba evitar las actividades que aumentan la presin sobre la hernia.  No levante objetos que pesen ms de 10libras (4,5kg)  hasta que el mdico le diga que es seguro.  Tome los medicamentos de venta libre y los recetados solamente como se lo haya indicado el mdico.  Consulting civil engineer a todas las visitas de control como se lo haya indicado el mdico. Esto es importante. SOLICITE ATENCIN MDICA SI:  La hernia se agranda.  La hernia le causa dolor. SOLICITE ATENCIN MDICA DE INMEDIATO SI:  Tiene un dolor intenso y repentino cerca de la zona de la hernia.  Tiene dolor, as como nuseas o vmitos.  Tiene dolor y la piel que recubre la hernia cambia de color.  Tiene fiebre. Esta informacin no tiene Marine scientist el consejo del mdico. Asegrese de hacerle al mdico cualquier pregunta que tenga. Document Released: 02/14/2016 Document Revised: 02/14/2016 Document Reviewed: 02/14/2016 Elsevier Interactive Patient Education  [employer].

## 2017-08-27 LAB — GC/CHLAMYDIA PROBE AMP (~~LOC~~) NOT AT ARMC
CHLAMYDIA, DNA PROBE: NEGATIVE
NEISSERIA GONORRHEA: NEGATIVE

## 2017-08-28 LAB — CULTURE, OB URINE: Culture: >=100000 — AB

## 2017-08-29 ENCOUNTER — Telehealth: Payer: Self-pay | Admitting: Student

## 2017-08-29 DIAGNOSIS — O2341 Unspecified infection of urinary tract in pregnancy, first trimester: Secondary | ICD-10-CM

## 2017-08-29 MED ORDER — AMPICILLIN 500 MG PO CAPS: 500.0000 mg | ORAL_CAPSULE | Freq: Four times a day (QID) | ORAL | 0 refills | Status: DC

## 2017-08-29 NOTE — Addendum Note (Signed)
Addended by: Jorje Guild B on: 08/29/2017 10:35 AM   Modules accepted: Orders

## 2017-08-29 NOTE — Telephone Encounter (Signed)
Left voicemail. Needs tx for + urine culture.

## 2017-08-29 NOTE — Telephone Encounter (Signed)
Pt returned phone call. Verified by name & DOB. Notified of positive urine culture & need for tx. Verified allergies & pharmacy. Ampicillin rx sent to [facility] on [address] per patient request.   Jorje Guild, NP

## 2017-09-13 ENCOUNTER — Encounter: Payer: Self-pay | Admitting: Obstetrics and Gynecology

## 2017-09-13 DIAGNOSIS — O0990 Supervision of high risk pregnancy, unspecified, unspecified trimester: Secondary | ICD-10-CM | POA: Insufficient documentation

## 2017-09-14 ENCOUNTER — Encounter: Payer: Self-pay | Admitting: Obstetrics and Gynecology

## 2017-09-14 ENCOUNTER — Encounter: Payer: Self-pay | Admitting: *Deleted

## 2017-09-14 ENCOUNTER — Ambulatory Visit (INDEPENDENT_AMBULATORY_CARE_PROVIDER_SITE_OTHER): Payer: Self-pay | Admitting: Obstetrics and Gynecology

## 2017-09-14 DIAGNOSIS — O09521 Supervision of elderly multigravida, first trimester: Secondary | ICD-10-CM | POA: Insufficient documentation

## 2017-09-14 DIAGNOSIS — Z3480 Encounter for supervision of other normal pregnancy, unspecified trimester: Secondary | ICD-10-CM

## 2017-09-14 DIAGNOSIS — Z3481 Encounter for supervision of other normal pregnancy, first trimester: Secondary | ICD-10-CM

## 2017-09-14 DIAGNOSIS — O09299 Supervision of pregnancy with other poor reproductive or obstetric history, unspecified trimester: Secondary | ICD-10-CM | POA: Insufficient documentation

## 2017-09-14 NOTE — Progress Notes (Signed)
New OB Note  09/14/2017   Clinic: [facility]  Chief Complaint: NOB  Transfer of Care Patient: no  History of Present Illness: [name] is a 36 y.o. [id number] @ 12/5 weeks ([place] 6/27, based on Patient's last menstrual period was 06/17/2017.=10wk u/s).  Preg complicated by has History of gestational diabetes; History of maternal fourth degree perineal laceration, currently pregnant; Supervision of other normal pregnancy, antepartum; AMA (advanced maternal age) multigravida 67+, first trimester; Language barrier; History of oligohydramnios in prior pregnancy, currently pregnant; and History of delivery by vacuum extraction, currently pregnant on their problem list.   Any events prior to today's visit: no Her periods were: regular, qmonth She was using no method when she conceived.  She has Negative signs or symptoms of nausea/vomiting of pregnancy. She has Negative signs or symptoms of miscarriage or preterm labor On any medications around the time she conceived/early pregnancy: No   ROS: A 12-point review of systems was performed and negative, except as stated in the above HPI.  OBGYN History: As per HPI. OB History  Gravida Para Term Preterm AB Living  4 2 2  0 1 2  SAB TAB Ectopic Multiple Live Births  1 0 0   2    # Outcome Date GA Lbr Len/2nd Weight Sex Delivery Anes PTL Lv  4 Current           3 Term 12/11/14 [redacted]w[redacted]d 03:13 / 02:43 7 lb 5.3 oz (3.325 kg) M Vag-Vacuum EPI  LIV  2 Term 01/28/12 [redacted]w[redacted]d / 07:26 7 lb 7 oz (3.374 kg) M Vag-Vacuum EPI, Local  LIV     Birth Comments: mongolian spots on left shoulder, 4th degree repaired  1 SAB 11/2010 [redacted]w[redacted]d            Birth Comments: No D&C      Any issues with any prior pregnancies: yes, GDMa2 Prior children are healthy, doing well, and without any problems or issues: yes History of pap smears: Yes. Last pap smear this year and results were negative   Past Medical History: Past Medical History:   Diagnosis Date  . Diabetes mellitus    prediabetes per MD no meds  . Umbilical hernia     Past Surgical History: Past Surgical History:  Procedure Laterality Date  . NO PAST SURGERIES      Family History:  Family History  Problem Relation Age of Onset  . Cancer Mother        cervical  . Miscarriages / Stillbirths Mother   . Cancer Maternal Aunt   . Anesthesia problems Neg Hx    She denies any history of mental retardation, birth defects or genetic disorders in her or the FOB's history  Social History:  Social History   Socioeconomic History  . Marital status: Married    Spouse name: Not on file  . Number of children: Not on file  . Years of education: Not on file  . Highest education level: Not on file  Social Needs  . Financial resource strain: Not on file  . Food insecurity - worry: Not on file  . Food insecurity - inability: Not on file  . Transportation needs - medical: Not on file  . Transportation needs - non-medical: Not on file  Occupational History  . Not on file  Tobacco Use  . Smoking status: Never Smoker  . Smokeless tobacco: Never Used  Substance and Sexual Activity  . Alcohol use: No  . Drug use: No  .  Sexual activity: Yes    Birth control/protection: Condom, None  Other Topics Concern  . Not on file  Social History Narrative   ** Merged History Encounter **        Allergy: No Known Allergies  Health Maintenance:  Mammogram Up to Date: not applicable  Current Outpatient Medications: PNV, flonase, claritin  Physical Exam:   BP 110/72   Pulse 72   Wt 157 lb 9.6 oz (71.5 kg)   LMP 06/17/2017   BMI 27.92 kg/m  Body mass index is 27.92 kg/m.   Vag. Bleeding: None. Fundal height: not applicable FHTs: 329J  General appearance: Well nourished, well developed female in no acute distress.  Neck:  Supple, normal appearance, and no thyromegaly  Cardiovascular: S1, S2 normal, no murmur, rub or gallop, regular rate and  rhythm Respiratory:  Clear to auscultation bilateral. Normal respiratory effort Abdomen: positive bowel sounds and no masses, hernias; diffusely non tender to palpation, non distended Breasts:  Pt denies any s/s Neuro/Psych:  Normal mood and affect.  Skin:  Warm and dry.   Laboratory: none  Imaging:  SLIUP with normal FHR c/w 12wks  Assessment: pt doing well  Plan: 1. Supervision of other normal pregnancy, antepartum Routine care. Pt would like to get pap records and let us know.  - Obstetric Panel, Including HIV - Culture, OB Urine - TSH - CMP and Liver - Protein / creatinine ratio, urine - Glucose tolerance, 1 hour  2. AMA Declines genetics. Baseline pre-x labs and tsh  3. H/o GDMa2 Will do 1hr today  4. H/o oligo G3 IOL for this. Consider serial u/s in third trimester  5. H/o 4th degree See PL. D/w pt later in pregnancy re: delivery moe and any s/s of bowel dysfunction.   Interpreter used  Problem list reviewed and updated.  Follow up in 3 weeks.   >50% of 25 min visit spent on counseling and coordination of care.     Durene Romans MD Attending [facility])

## 2017-09-14 NOTE — Progress Notes (Signed)
Needs letter for flu shot

## 2017-09-15 LAB — OBSTETRIC PANEL, INCLUDING HIV
Antibody Screen: NEGATIVE
BASOS ABS: 0 10*3/uL (ref 0.0–0.2)
Basos: 0 %
EOS (ABSOLUTE): 0 10*3/uL (ref 0.0–0.4)
Eos: 1 %
HIV SCREEN 4TH GENERATION: NONREACTIVE
Hematocrit: 37.4 % (ref 34.0–46.6)
Hemoglobin: 12.9 g/dL (ref 11.1–15.9)
Hepatitis B Surface Ag: NEGATIVE
IMMATURE GRANULOCYTES: 0 %
Immature Grans (Abs): 0 10*3/uL (ref 0.0–0.1)
LYMPHS ABS: 1.4 10*3/uL (ref 0.7–3.1)
Lymphs: 24 %
MCH: 32.7 pg (ref 26.6–33.0)
MCHC: 34.5 g/dL (ref 31.5–35.7)
MCV: 95 fL (ref 79–97)
MONOS ABS: 0.5 10*3/uL (ref 0.1–0.9)
Monocytes: 8 %
NEUTROS PCT: 67 %
Neutrophils Absolute: 4 10*3/uL (ref 1.4–7.0)
PLATELETS: 165 10*3/uL (ref 150–379)
RBC: 3.95 x10E6/uL (ref 3.77–5.28)
RDW: 14.5 % (ref 12.3–15.4)
RH TYPE: POSITIVE
RPR Ser Ql: NONREACTIVE
Rubella Antibodies, IGG: 15 index (ref >0.99)
WBC: 5.9 10*3/uL (ref 3.4–10.8)

## 2017-09-15 LAB — PROTEIN / CREATININE RATIO, URINE
Creatinine, Urine: 48.6 mg/dL
PROTEIN UR: 5.8 mg/dL
PROTEIN/CREAT RATIO: 119 mg/g{creat} (ref 0–200)

## 2017-09-15 LAB — CMP AND LIVER
ALT: 13 IU/L (ref 0–32)
AST: 14 IU/L (ref 0–40)
Albumin: 3.8 g/dL (ref 3.5–5.5)
Alkaline Phosphatase: 51 IU/L (ref 39–117)
BILIRUBIN, DIRECT: 0.05 mg/dL (ref 0.00–0.40)
BUN: 7 mg/dL (ref 6–20)
Bilirubin Total: 0.2 mg/dL (ref 0.0–1.2)
CALCIUM: 8.7 mg/dL (ref 8.7–10.2)
CO2: 21 mmol/L (ref 20–29)
Chloride: 106 mmol/L (ref 96–106)
Creatinine, Ser: 0.42 mg/dL — ABNORMAL LOW (ref 0.57–1.00)
GFR calc non Af Amer: 131 mL/min/{1.73_m2} (ref >59)
GFR, EST AFRICAN AMERICAN: 151 mL/min/{1.73_m2} (ref >59)
GLUCOSE: 76 mg/dL (ref 65–99)
Potassium: 4.3 mmol/L (ref 3.5–5.2)
Sodium: 140 mmol/L (ref 134–144)
Total Protein: 6.3 g/dL (ref 6.0–8.5)

## 2017-09-15 LAB — TSH: TSH: 2.41 u[IU]/mL (ref 0.450–4.500)

## 2017-09-15 LAB — GLUCOSE TOLERANCE, 1 HOUR: GLUCOSE, 1HR PP: 195 mg/dL (ref 65–199)

## 2017-09-16 ENCOUNTER — Encounter: Payer: Self-pay | Admitting: Obstetrics and Gynecology

## 2017-09-16 LAB — URINE CULTURE, OB REFLEX

## 2017-09-16 LAB — CULTURE, OB URINE

## 2017-09-17 ENCOUNTER — Encounter: Payer: Self-pay | Admitting: Radiology

## 2017-09-28 NOTE — L&D Delivery Note (Signed)
Faculty Note  In to room for recurrent deep variability, patient pushing and found to be 10/100/+2. She has been pushing over 2 hours with minimal further descent of head despite maternal effort and recurrent deep variables noted. Patient counseled regarding recommendation for vacuum assisted vaginal delivery. Reviewed risks of vacuum assisted delivery including risk of cephalohematoma, subgaleal hemorrhage, failed vacuum or dystocia requiring emergency c-section, risks of prolonged hypoxia in fetus, risks of damage to maternal tissue. She verbalizes understanding of the above and is agreeable to a vacuum assisted delivery.  Bell suction cup applied to fetal head, 2 cm anterior to the posterior fontanelle and equidistant across the sagittal sutures. The cup was inspected to ensure to vaginal tissue was free. Suction increased to 450 mm Hg and gentle traction placed on vacuum device during contraction to assist maternal expulsive forces. Total applied pressure time 40 with 4 pulls. 0 popoffs occurred. Suction released in between pulls and with delivery of fetal head. Infant delivered easily from LOT position. Infant vigorous from delivery with tight cord around shoulders. Delayed cord clamping done, nose and mouth bulb suctione, cord clamped and cut and infant handed to waiting pedi staff who inspected infant and found no evidence of cephalohematoma or similar injury.  Placenta delivered spontaneously intact, 3VC. Brisk bleed noted with 2nd degree laceration which was repaired in running locked fashion with 2-0 Vicryl. IM methergine given for boggy uterus with some clots that improved after methergine and manual clearance and massage. Patient tolerated well. EBL: 500 mL    K. Arvilla Meres, M.D. Attending [place] for [employer] [facility], [place]

## 2017-10-05 ENCOUNTER — Ambulatory Visit (INDEPENDENT_AMBULATORY_CARE_PROVIDER_SITE_OTHER): Payer: Self-pay | Admitting: Obstetrics & Gynecology

## 2017-10-05 VITALS — BP 112/68 | HR 74 | Wt 160.2 lb

## 2017-10-05 DIAGNOSIS — Z789 Other specified health status: Secondary | ICD-10-CM

## 2017-10-05 DIAGNOSIS — O09521 Supervision of elderly multigravida, first trimester: Secondary | ICD-10-CM

## 2017-10-05 DIAGNOSIS — Z3480 Encounter for supervision of other normal pregnancy, unspecified trimester: Secondary | ICD-10-CM

## 2017-10-05 DIAGNOSIS — O24419 Gestational diabetes mellitus in pregnancy, unspecified control: Secondary | ICD-10-CM

## 2017-10-05 MED ORDER — GLUCOSE BLOOD VI STRP: ORAL_STRIP | 12 refills | Status: DC

## 2017-10-05 MED ORDER — ACCU-CHEK FASTCLIX LANCETS MISC: 1.0000 [IU] | Freq: Four times a day (QID) | 12 refills | Status: DC

## 2017-10-05 NOTE — Progress Notes (Signed)
   PRENATAL VISIT NOTE  Subjective:  Lynn Burnett is a 37 y.o. [phone] at [redacted]w[redacted]d being seen today for ongoing prenatal care.  She is currently monitored for the following issues for this high-risk pregnancy and has GDM (gestational diabetes mellitus); History of gestational diabetes; History of maternal fourth degree perineal laceration, currently pregnant; Supervision of other normal pregnancy, antepartum; AMA (advanced maternal age) multigravida 104+, first trimester; Language barrier; History of oligohydramnios in prior pregnancy, currently pregnant; and History of delivery by vacuum extraction, currently pregnant on their problem list.  Patient reports no complaints.  Contractions: Not present. Vag. Bleeding: None.  Movement: Present. Denies leaking of fluid.   The following portions of the patient's history were reviewed and updated as appropriate: allergies, current medications, past family history, past medical history, past social history, past surgical history and problem list. Problem list updated.  Objective:   Vitals:   10/05/17 0956  BP: 112/68  Pulse: 74  Weight: 160 lb 3.2 oz (72.7 kg)    Fetal Status:     Movement: Present     General:  Alert, oriented and cooperative. Patient is in no acute distress.  Skin: Skin is warm and dry. No rash noted.   Cardiovascular: Normal heart rate noted  Respiratory: Normal respiratory effort, no problems with respiration noted  Abdomen: Soft, gravid, appropriate for gestational age.  Pain/Pressure: Absent     Pelvic: Cervical exam deferred        Extremities: Normal range of motion.  Edema: None  Mental Status:  Normal mood and affect. Normal behavior. Normal judgment and thought content.   Assessment and Plan:  Pregnancy: [id number] at [redacted]w[redacted]d  1. Supervision of other normal pregnancy, antepartum - anatomy u/s with MFM ordered  2. Language barrier - Interpretor present for visit  3. Gestational diabetes mellitus (GDM) in second  trimester, gestational diabetes method of control unspecified - She took an oral hypogylcemic with last pregnancy. She tells me that she still has her glucometer and supplies and all the notes from her visit to the dietician. She declines a visit with the dietician again. We discussed rec'd weight gain. She will start checking her sugars again today. We will evaluate her sugars in 2 weeks and see if she needs meds. - anatomy u/s  4. AMA (advanced maternal age) multigravida 81+, first trimester - She declines genetic testing - rec MSAFP at next visit  Preterm labor symptoms and general obstetric precautions including but not limited to vaginal bleeding, contractions, leaking of fluid and fetal movement were reviewed in detail with the patient. Please refer to After Visit Summary for other counseling recommendations.  Return in about 2 weeks (around 10/19/2017).   Emily Filbert, MD

## 2017-10-19 ENCOUNTER — Encounter: Payer: Self-pay | Admitting: Obstetrics & Gynecology

## 2017-10-20 ENCOUNTER — Ambulatory Visit (INDEPENDENT_AMBULATORY_CARE_PROVIDER_SITE_OTHER): Payer: Self-pay | Admitting: Obstetrics and Gynecology

## 2017-10-20 VITALS — BP 112/61 | HR 72 | Wt 158.5 lb

## 2017-10-20 DIAGNOSIS — Z789 Other specified health status: Secondary | ICD-10-CM

## 2017-10-20 DIAGNOSIS — O09292 Supervision of pregnancy with other poor reproductive or obstetric history, second trimester: Secondary | ICD-10-CM

## 2017-10-20 DIAGNOSIS — O24419 Gestational diabetes mellitus in pregnancy, unspecified control: Secondary | ICD-10-CM

## 2017-10-20 DIAGNOSIS — K429 Umbilical hernia without obstruction or gangrene: Secondary | ICD-10-CM

## 2017-10-20 DIAGNOSIS — O09299 Supervision of pregnancy with other poor reproductive or obstetric history, unspecified trimester: Secondary | ICD-10-CM

## 2017-10-20 DIAGNOSIS — O09521 Supervision of elderly multigravida, first trimester: Secondary | ICD-10-CM

## 2017-10-20 DIAGNOSIS — Z3480 Encounter for supervision of other normal pregnancy, unspecified trimester: Secondary | ICD-10-CM

## 2017-10-20 DIAGNOSIS — O09522 Supervision of elderly multigravida, second trimester: Secondary | ICD-10-CM

## 2017-10-20 LAB — POCT URINALYSIS DIP (DEVICE)
Bilirubin Urine: NEGATIVE
GLUCOSE, UA: NEGATIVE mg/dL
Hgb urine dipstick: NEGATIVE
Ketones, ur: NEGATIVE mg/dL
Leukocytes, UA: NEGATIVE
Nitrite: NEGATIVE
PH: 6.5 (ref 5.0–8.0)
Protein, ur: NEGATIVE mg/dL
Specific Gravity, Urine: 1.015 (ref 1.005–1.030)
UROBILINOGEN UA: 0.2 mg/dL (ref 0.0–1.0)

## 2017-10-20 MED ORDER — GLYBURIDE 2.5 MG PO TABS: 2.5000 mg | ORAL_TABLET | Freq: Every day | ORAL | 3 refills | Status: DC

## 2017-10-20 MED ORDER — ASPIRIN EC 81 MG PO TBEC: 81.0000 mg | DELAYED_RELEASE_TABLET | Freq: Every day | ORAL | 2 refills | Status: DC

## 2017-10-20 NOTE — Progress Notes (Addendum)
   PRENATAL VISIT NOTE  Subjective:  Shadee Rathod is a 37 y.o. [phone] at [redacted]w[redacted]d being seen today for ongoing prenatal care.  She is currently monitored for the following issues for this high-risk pregnancy and has GDM (gestational diabetes mellitus); History of gestational diabetes; History of maternal fourth degree perineal laceration, currently pregnant; Supervision of other normal pregnancy, antepartum; AMA (advanced maternal age) multigravida 65+, first trimester; Language barrier; History of oligohydramnios in prior pregnancy, currently pregnant; and History of delivery by vacuum extraction, currently pregnant on their problem list.  Patient reports some hip pain.  Contractions: Not present. Vag. Bleeding: None.  Movement: Present. Denies leaking of fluid.   DM (dx on early 1 hr) started checking sugars after last visit, not on meds FG: 90-123, mostly > 100 PP: 92-151, mostly < 120  The following portions of the patient's history were reviewed and updated as appropriate: allergies, current medications, past family history, past medical history, past social history, past surgical history and problem list. Problem list updated.  Objective:   Vitals:   10/20/17 1417  BP: 112/61  Pulse: 72  Weight: 158 lb 8 oz (71.9 kg)    Fetal Status: Fetal Heart Rate (bpm): 151   Movement: Present     General:  Alert, oriented and cooperative. Patient is in no acute distress.  Skin: Skin is warm and dry. No rash noted.   Cardiovascular: Normal heart rate noted  Respiratory: Normal respiratory effort, no problems with respiration noted  Abdomen: Soft, gravid, appropriate for gestational age.  Pain/Pressure: Absent     Pelvic: Cervical exam deferred        Extremities: Normal range of motion.  Edema: None  Mental Status:  Normal mood and affect. Normal behavior. Normal judgment and thought content.   Assessment and Plan:  Pregnancy: [id number] at [redacted]w[redacted]d  1. Gestational diabetes mellitus  (GDM) in second trimester, gestational diabetes method of control unspecified + early 1 hr Elevated fasting BG Will start glyburide 2.5 mg QHS To start baby ASA  2. Supervision of other normal pregnancy, antepartum  3. History of maternal fourth degree perineal laceration, currently pregnant  4. AMA (advanced maternal age) multigravida 18+, first trimester  6. History of delivery by vacuum extraction, currently pregnant  7. History of oligohydramnios in prior pregnancy, currently pregnant  8. Umbilical hernia - soft, non-tender Reviewed s/s to present to MAU  Preterm labor symptoms and general obstetric precautions including but not limited to vaginal bleeding, contractions, leaking of fluid and fetal movement were reviewed in detail with the patient. Please refer to After Visit Summary for other counseling recommendations.  Return in about 2 weeks (around 11/03/2017) for OB visit (MD).   Sloan Leiter, MD

## 2017-10-21 ENCOUNTER — Encounter (HOSPITAL_COMMUNITY): Payer: Self-pay | Admitting: Obstetrics & Gynecology

## 2017-10-28 ENCOUNTER — Ambulatory Visit (HOSPITAL_COMMUNITY)
Admission: RE | Admit: 2017-10-28 | Discharge: 2017-10-28 | Disposition: A | Discharge status: Home / Self Care | Payer: Self-pay | Source: Ambulatory Visit | Attending: Obstetrics & Gynecology | Admitting: Obstetrics & Gynecology

## 2017-10-28 ENCOUNTER — Encounter (HOSPITAL_COMMUNITY): Payer: Self-pay

## 2017-10-28 ENCOUNTER — Other Ambulatory Visit: Payer: Self-pay | Admitting: Obstetrics & Gynecology

## 2017-10-28 DIAGNOSIS — Z363 Encounter for antenatal screening for malformations: Secondary | ICD-10-CM

## 2017-10-28 DIAGNOSIS — K429 Umbilical hernia without obstruction or gangrene: Secondary | ICD-10-CM

## 2017-10-28 DIAGNOSIS — Z3480 Encounter for supervision of other normal pregnancy, unspecified trimester: Secondary | ICD-10-CM

## 2017-10-28 DIAGNOSIS — Z3A19 19 weeks gestation of pregnancy: Secondary | ICD-10-CM

## 2017-10-28 DIAGNOSIS — O09522 Supervision of elderly multigravida, second trimester: Secondary | ICD-10-CM | POA: Insufficient documentation

## 2017-10-28 DIAGNOSIS — O24415 Gestational diabetes mellitus in pregnancy, controlled by oral hypoglycemic drugs: Secondary | ICD-10-CM | POA: Insufficient documentation

## 2017-10-29 ENCOUNTER — Other Ambulatory Visit (HOSPITAL_COMMUNITY): Payer: Self-pay | Admitting: *Deleted

## 2017-10-29 DIAGNOSIS — O24119 Pre-existing diabetes mellitus, type 2, in pregnancy, unspecified trimester: Secondary | ICD-10-CM

## 2017-10-29 DIAGNOSIS — Z7984 Long term (current) use of oral hypoglycemic drugs: Principal | ICD-10-CM

## 2017-11-02 ENCOUNTER — Ambulatory Visit (INDEPENDENT_AMBULATORY_CARE_PROVIDER_SITE_OTHER): Payer: Self-pay | Admitting: Family Medicine

## 2017-11-02 VITALS — BP 105/63 | HR 75 | Wt 160.0 lb

## 2017-11-02 DIAGNOSIS — O0990 Supervision of high risk pregnancy, unspecified, unspecified trimester: Secondary | ICD-10-CM

## 2017-11-02 DIAGNOSIS — O24419 Gestational diabetes mellitus in pregnancy, unspecified control: Secondary | ICD-10-CM

## 2017-11-02 DIAGNOSIS — O09521 Supervision of elderly multigravida, first trimester: Secondary | ICD-10-CM

## 2017-11-02 LAB — POCT URINALYSIS DIP (DEVICE)
Bilirubin Urine: NEGATIVE
GLUCOSE, UA: 100 mg/dL — AB
Hgb urine dipstick: NEGATIVE
Leukocytes, UA: NEGATIVE
NITRITE: NEGATIVE
PROTEIN: NEGATIVE mg/dL
Specific Gravity, Urine: 1.025 (ref 1.005–1.030)
UROBILINOGEN UA: 0.2 mg/dL (ref 0.0–1.0)
pH: 6 (ref 5.0–8.0)

## 2017-11-02 NOTE — Progress Notes (Signed)
   PRENATAL VISIT NOTE  Subjective:  Lynn Burnett is a 37 y.o. [phone] at [redacted]w[redacted]d being seen today for ongoing prenatal care.  She is currently monitored for the following issues for this high-risk pregnancy and has GDM, class A2; History of gestational diabetes; History of maternal fourth degree perineal laceration, currently pregnant; Supervision of high risk pregnancy, antepartum; AMA (advanced maternal age) multigravida 81+, first trimester; Language barrier; History of oligohydramnios in prior pregnancy, currently pregnant; History of delivery by vacuum extraction, currently pregnant; and Umbilical hernia on their problem list.  Patient reports no complaints.  Contractions: Not present. Vag. Bleeding: None.  Movement: Present. Denies leaking of fluid.   Fasting blood sugars improved, all around 80s now. Only 1 in 90s. 2 hour postprandial improved as well, almost all under 115 and one at 130. She had 1 episode of hypoglycemia to 62 and drank juice and felt better.   The following portions of the patient's history were reviewed and updated as appropriate: allergies, current medications, past family history, past medical history, past social history, past surgical history and problem list. Problem list updated.  Objective:   Vitals:   11/02/17 0921  BP: 105/63  Pulse: 75  Weight: 160 lb (72.6 kg)    Fetal Status: Fetal Heart Rate (bpm): 143   Movement: Present     General:  Alert, oriented and cooperative. Patient is in no acute distress.  Skin: Skin is warm and dry. No rash noted.   Cardiovascular: Normal heart rate noted  Respiratory: Normal respiratory effort, no problems with respiration noted  Abdomen: Soft, gravid, appropriate for gestational age.  Pain/Pressure: Absent     Pelvic: Cervical exam deferred        Extremities: Normal range of motion.  Edema: None  Mental Status:  Normal mood and affect. Normal behavior. Normal judgment and thought content.   Assessment and  Plan:  Pregnancy: [id number] at [redacted]w[redacted]d  1. Supervision of high risk pregnancy, antepartum Doing well  2. GDM, class A2 Continue glyberide 2.5 nightly.   3. AMA (advanced maternal age) multigravida 47+, first trimester    Please refer to After Visit Summary for other counseling recommendations.  Return in about 4 weeks (around 11/30/2017).   Dannielle Huh, DO

## 2017-11-02 NOTE — Patient Instructions (Signed)

## 2017-11-30 ENCOUNTER — Ambulatory Visit (INDEPENDENT_AMBULATORY_CARE_PROVIDER_SITE_OTHER): Payer: Self-pay | Admitting: Obstetrics and Gynecology

## 2017-11-30 ENCOUNTER — Encounter: Payer: Self-pay | Admitting: Obstetrics and Gynecology

## 2017-11-30 VITALS — BP 108/61 | HR 72 | Wt 162.4 lb

## 2017-11-30 DIAGNOSIS — O24419 Gestational diabetes mellitus in pregnancy, unspecified control: Secondary | ICD-10-CM

## 2017-11-30 DIAGNOSIS — O09299 Supervision of pregnancy with other poor reproductive or obstetric history, unspecified trimester: Secondary | ICD-10-CM

## 2017-11-30 DIAGNOSIS — O0990 Supervision of high risk pregnancy, unspecified, unspecified trimester: Secondary | ICD-10-CM

## 2017-11-30 DIAGNOSIS — O09521 Supervision of elderly multigravida, first trimester: Secondary | ICD-10-CM

## 2017-11-30 NOTE — Patient Instructions (Signed)
Segundo trimestre de Media planner (Second Trimester of Pregnancy) El segundo trimestre va desde la semana13 hasta la 64, desde el cuarto hasta el sexto mes, y suele ser el momento en el que mejor se siente. En general, las nuseas matutinas han disminuido o han desaparecido completamente. Tendr ms energa y podr aumentarle el apetito. El beb por nacer (feto) se desarrolla rpidamente. Hacia el final del sexto mes, el beb mide aproximadamente 9 pulgadas (23 cm) y pesa alrededor de 1 libras (700 g). Es probable que sienta al beb moverse (dar pataditas) entre las 18 y 77 semanas del Media planner. CUIDADOS EN EL HOGAR  No fume, no consuma hierbas ni beba alcohol. No tome frmacos que el mdico no haya autorizado.  No consuma ningn producto que contenga tabaco, lo que incluye cigarrillos, tabaco de Higher education careers adviser o Psychologist, sport and exercise. Si necesita ayuda para dejar de fumar, consulte al [employer]. Puede recibir asesoramiento u otro tipo de apoyo para dejar de fumar.  Tome los medicamentos solamente como se lo haya indicado el mdico. Algunos medicamentos son seguros para tomar durante el Media planner y otros no lo son.  Haga ejercicios solamente como se lo haya indicado el mdico. Interrumpa la actividad fsica si comienza a tener calambres.  Ingiera alimentos saludables de [place] regular.  Use un sostn que le brinde buen soporte si sus mamas estn sensibles.  No se d baos de inmersin en agua caliente, baos turcos ni saunas.  Colquese el cinturn de seguridad cuando conduzca.  No coma carne cruda ni queso sin cocinar; evite el contacto con las bandejas sanitarias de los gatos y la tierra que estos animales usan.  Seama.  Tome entre 1500 y 2082m de calcio diariamente comenzando en la [id number] embarazo h[place]ins[place]  Pruebe tomar un medicamento que la ayude a defecar (un laxante suave) si el mdico lo autoriza. Consuma ms fibra, que se encuentra en las frutas y  verduras frescas y los cereales integrales. Beba suficiente lquido para mantener el pis (orina) claro o de color amarillo plido.  Dese baos de asiento con agua tibia para aBest boyo las molestias causadas por las hemorroides. Use una crema para las hemorroides si el mdico la autoriza.  Si se le hinchan las venas (venas varicosas), use medias de descanso. Levante (eleve) los pies durante 168mutos, 3 o 4veces por daTraining and development officerLimite el consumo de sal en su dieta.  No levante objetos pesados, use zapatos de tacones bajos y sintese derecha.  Descanse con las piernas elevadas si tiene calambres o dolor de cintura.  Visite a su dentista si no lo ha heQuarry managerUse un cepillo de cerdas suaves para cepillarse los dientes. Psese el hilo dental con suavidad.  Puede seguir maAmerican Electric Powera menos que el mdico le indique lo contrario.  Concurra a los controles mdicos.  SOLICITE AYUDA SI:  Siente mareos.  Sufre calambres o presin leves en la parte baja del vientre (abdomen).  Sufre un dolor persistente en el abdomen.  Tiene maHigher education careers advisernuseas), vmitos, o tiene deposiciones acuosas (diarrea).  Advierte un olor ftido que proviene de la vagina.  Siente dolor al orContinental Airlines SOLICITE AYUDA DE INMEDIATO SI:  Tiene fiebre.  Tiene una prdida de lquido por la vagina.  Tiene sangrado o pequeas prdidas vaginales.  Siente dolor intenso o clicos en el abdomen.  Sube o baja de peso rpidamente.  Tiene dificultades para recuperar el aliento y siente dolor en el pecho.  Sbitamente se  le hinchan mucho el rostro, las manos, los tobillos, los pies o las piernas.  No ha sentido los movimientos del beb durante una hora.  Siente un dolor de cabeza intenso que no se alivia con medicamentos.  Su visin se modifica.  Esta informacin no tiene como fin reemplazar el consejo del mdico. Asegrese de hacerle al mdico cualquier pregunta que  tenga. Document Released: 05/17/2013 Document Revised: 10/05/2014 Document Reviewed: 11/15/2012 Elsevier Interactive Patient Education  2017 Elsevier Inc.  

## 2017-11-30 NOTE — Progress Notes (Signed)
Subjective:  Lynn Burnett is a 37 y.o. [phone] at [redacted]w[redacted]d being seen today for ongoing prenatal care.  She is currently monitored for the following issues for this high-risk pregnancy and has GDM, class A2; History of gestational diabetes; History of maternal fourth degree perineal laceration, currently pregnant; Supervision of high risk pregnancy, antepartum; AMA (advanced maternal age) multigravida 97+, first trimester; Language barrier; History of oligohydramnios in prior pregnancy, currently pregnant; History of delivery by vacuum extraction, currently pregnant; and Umbilical hernia on their problem list.  Patient reports no complaints.  Contractions: Not present. Vag. Bleeding: None.  Movement: Present. Denies leaking of fluid.   The following portions of the patient's history were reviewed and updated as appropriate: allergies, current medications, past family history, past medical history, past social history, past surgical history and problem list. Problem list updated.  Objective:   Vitals:   11/30/17 0829  BP: 108/61  Pulse: 72  Weight: 73.7 kg (162 lb 6.4 oz)    Fetal Status: Fetal Heart Rate (bpm): 149 Fundal Height: 24 cm Movement: Present     General:  Alert, oriented and cooperative. Patient is in no acute distress.  Skin: Skin is warm and dry. No rash noted.   Cardiovascular: Normal heart rate noted  Respiratory: Normal respiratory effort, no problems with respiration noted  Abdomen: Soft, gravid, appropriate for gestational age. Pain/Pressure: Absent     Pelvic:  Cervical exam deferred        Extremities: Normal range of motion.  Edema: None  Mental Status: Normal mood and affect. Normal behavior. Normal judgment and thought content.   Urinalysis:      Assessment and Plan:  Pregnancy: [id number] at [redacted]w[redacted]d  1. Supervision of high risk pregnancy, antepartum Stable  2. GDM, class A2 CBG'S in goal range Continue with Glyburide U/S next week - Hemoglobin A1c - US  Fetal Echocardiography; Future  3. AMA (advanced maternal age) multigravida 41+, first trimester Declined genetic testing  4. History of maternal fourth degree perineal laceration, currently pregnant Will need to discuss mode of delivery at later appts  Preterm labor symptoms and general obstetric precautions including but not limited to vaginal bleeding, contractions, leaking of fluid and fetal movement were reviewed in detail with the patient. Please refer to After Visit Summary for other counseling recommendations.  Return in about 4 weeks (around 12/28/2017) for OB visit.   Chancy Milroy, MD

## 2017-12-01 LAB — HEMOGLOBIN A1C
ESTIMATED AVERAGE GLUCOSE: 91 mg/dL
HEMOGLOBIN A1C: 4.8 % (ref 4.8–5.6)

## 2017-12-07 ENCOUNTER — Ambulatory Visit (HOSPITAL_COMMUNITY)
Admission: RE | Admit: 2017-12-07 | Discharge: 2017-12-07 | Disposition: A | Discharge status: Home / Self Care | Payer: Self-pay | Source: Ambulatory Visit | Attending: Obstetrics and Gynecology | Admitting: Obstetrics and Gynecology

## 2017-12-07 ENCOUNTER — Encounter (HOSPITAL_COMMUNITY): Payer: Self-pay

## 2017-12-07 ENCOUNTER — Other Ambulatory Visit (HOSPITAL_COMMUNITY): Payer: Self-pay | Admitting: Maternal and Fetal Medicine

## 2017-12-07 ENCOUNTER — Other Ambulatory Visit (HOSPITAL_COMMUNITY): Payer: Self-pay | Admitting: *Deleted

## 2017-12-07 DIAGNOSIS — O24415 Gestational diabetes mellitus in pregnancy, controlled by oral hypoglycemic drugs: Secondary | ICD-10-CM

## 2017-12-07 DIAGNOSIS — O24119 Pre-existing diabetes mellitus, type 2, in pregnancy, unspecified trimester: Secondary | ICD-10-CM

## 2017-12-07 DIAGNOSIS — D259 Leiomyoma of uterus, unspecified: Secondary | ICD-10-CM | POA: Insufficient documentation

## 2017-12-07 DIAGNOSIS — O09292 Supervision of pregnancy with other poor reproductive or obstetric history, second trimester: Secondary | ICD-10-CM | POA: Insufficient documentation

## 2017-12-07 DIAGNOSIS — Z362 Encounter for other antenatal screening follow-up: Secondary | ICD-10-CM | POA: Insufficient documentation

## 2017-12-07 DIAGNOSIS — O3412 Maternal care for benign tumor of corpus uteri, second trimester: Secondary | ICD-10-CM

## 2017-12-07 DIAGNOSIS — Z3A24 24 weeks gestation of pregnancy: Secondary | ICD-10-CM | POA: Insufficient documentation

## 2017-12-07 DIAGNOSIS — O09522 Supervision of elderly multigravida, second trimester: Secondary | ICD-10-CM

## 2017-12-07 DIAGNOSIS — O09529 Supervision of elderly multigravida, unspecified trimester: Secondary | ICD-10-CM

## 2017-12-07 DIAGNOSIS — Z7984 Long term (current) use of oral hypoglycemic drugs: Secondary | ICD-10-CM

## 2017-12-28 ENCOUNTER — Telehealth: Payer: Self-pay

## 2017-12-28 ENCOUNTER — Ambulatory Visit (INDEPENDENT_AMBULATORY_CARE_PROVIDER_SITE_OTHER): Payer: Self-pay | Admitting: Obstetrics and Gynecology

## 2017-12-28 VITALS — BP 106/51 | HR 79 | Wt 163.5 lb

## 2017-12-28 DIAGNOSIS — O24419 Gestational diabetes mellitus in pregnancy, unspecified control: Secondary | ICD-10-CM

## 2017-12-28 DIAGNOSIS — O0990 Supervision of high risk pregnancy, unspecified, unspecified trimester: Secondary | ICD-10-CM

## 2017-12-28 DIAGNOSIS — Z23 Encounter for immunization: Secondary | ICD-10-CM

## 2017-12-28 DIAGNOSIS — O09292 Supervision of pregnancy with other poor reproductive or obstetric history, second trimester: Secondary | ICD-10-CM

## 2017-12-28 DIAGNOSIS — O09521 Supervision of elderly multigravida, first trimester: Secondary | ICD-10-CM

## 2017-12-28 DIAGNOSIS — O0992 Supervision of high risk pregnancy, unspecified, second trimester: Secondary | ICD-10-CM

## 2017-12-28 DIAGNOSIS — O09522 Supervision of elderly multigravida, second trimester: Secondary | ICD-10-CM

## 2017-12-28 DIAGNOSIS — O09299 Supervision of pregnancy with other poor reproductive or obstetric history, unspecified trimester: Secondary | ICD-10-CM

## 2017-12-28 NOTE — Progress Notes (Signed)
Tdap given in left arm @ 9:07 on 12/28/17

## 2017-12-28 NOTE — Telephone Encounter (Signed)
Called pt to inform her of her Echocardiogram appointment scheduled on 01/25/18 at 10:30am. Pt verbalized understanding and had no questions.

## 2017-12-28 NOTE — Progress Notes (Signed)
Subjective:  Lynn Burnett is a 37 y.o. [phone] at [redacted]w[redacted]d being seen today for ongoing prenatal care.  She is currently monitored for the following issues for this high-risk pregnancy and has GDM, class A2; History of gestational diabetes; History of maternal fourth degree perineal laceration, currently pregnant; Supervision of high risk pregnancy, antepartum; AMA (advanced maternal age) multigravida 38+, first trimester; History of oligohydramnios in prior pregnancy, currently pregnant; History of delivery by vacuum extraction, currently pregnant; and Umbilical hernia on their problem list.  Patient reports no complaints.  Contractions: Not present. Vag. Bleeding: None.  Movement: Present. Denies leaking of fluid.   The following portions of the patient's history were reviewed and updated as appropriate: allergies, current medications, past family history, past medical history, past social history, past surgical history and problem list. Problem list updated.  Objective:   Vitals:   12/28/17 0902  BP: (!) 106/51  Pulse: 79  Weight: 74.2 kg (163 lb 8 oz)    Fetal Status: Fetal Heart Rate (bpm): 140 Fundal Height: 29 cm Movement: Present     General:  Alert, oriented and cooperative. Patient is in no acute distress.  Skin: Skin is warm and dry. No rash noted.   Cardiovascular: Normal heart rate noted  Respiratory: Normal respiratory effort, no problems with respiration noted  Abdomen: Soft, gravid, appropriate for gestational age. Pain/Pressure: Absent     Pelvic: Vag. Bleeding: None     Cervical exam deferred        Extremities: Normal range of motion.  Edema: None  Mental Status: Normal mood and affect. Normal behavior. Normal judgment and thought content.   Urinalysis:      Assessment and Plan:  Pregnancy: [id number] at [redacted]w[redacted]d  1. Supervision of high risk pregnancy, antepartum Doing well. 28wk labs collected. Tdap received - CBC - HIV antibody - RPR - Tdap vaccine greater  than or equal to 7yo IM  2. GDM, class A2 CBGs within goal range with ~3 out of range postprandial. Continue glyburide. [place] scheduled for next week. Fetal echo scheduled. Antenatal testing starting at 32 weeks.   3. History of maternal fourth degree perineal laceration, currently pregnant Desires to have another vaginal delivery.   4. AMA (advanced maternal age) multigravida 72+, first trimester Declined genetics  Preterm labor symptoms and general obstetric precautions including but not limited to vaginal bleeding, contractions, leaking of fluid and fetal movement were reviewed in detail with the patient. Please refer to After Visit Summary for other counseling recommendations.  Return in about 2 weeks (around 01/11/2018) for ob visit.   Katheren Shams, DO

## 2017-12-28 NOTE — Patient Instructions (Signed)
Diagnstico de diabetes mellitus gestacional  (Gestational Diabetes Mellitus, Diagnosis)  La diabetes gestacional (diabetes mellitus gestacional) es una forma de diabetes a corto plazo (temporal) que puede aparecer durante el embarazo. Este cuadro desaparece despus del parto. Puede deberse a uno de estos problemas o a ambos:   El cuerpo no produce la cantidad suficiente de una hormona llamada insulina.   El cuerpo no responde de forma normal a la insulina que produce.  La insulina permite que los ciertos azcares (glucosa) ingresen a las clulas del cuerpo. Esto le proporciona la energa. Cuando se tiene diabetes, la glucosa no puede ingresar a las clulas. Esto produce un aumento del nivel de glucosa en la sangre (hiperglucemia).  Si la diabetes se trata, es posible que ni usted ni el beb se vean afectados. El mdico fijar los objetivos del tratamiento para usted. Generalmente, los resultados de la glucemia deben ser los siguientes:   Despus de no comer durante mucho tiempo (ayunar): 95mg/dl (5,3mmol/l).   Despus de las comidas (posprandial):  ? Una hora despus de una comida: igual o menor que 140mg/dl (7,8mmol/l).  ? Dos horas despus de una comida: igual o menor que 120mg/dl (6,7mmol/l).   Nivel de A1c (hemoglobinaA1c): del 6% al 6,5%.  CUIDADOS EN EL HOGAR  Preguntas para hacerle al mdico  Puede hacer las siguientes preguntas:   Debo reunirme con un instructor para el cuidado de la diabetes?   Dnde puedo encontrar un grupo de apoyo para personas diabticas?   Qu equipos necesitar para cuidarme en casa?   Qu medicamentos para la diabetes necesito? Cundo debo tomarlos?   Con qu frecuencia debo controlarme el nivel de glucosa en la sangre?   A qu nmero puedo llamar si tengo preguntas?   Cundo es la prxima cita con el mdico?  Instrucciones generales   Tome los medicamentos de venta libre y los recetados solamente como se lo haya indicado el mdico.   Mantenga un peso  saludable durante el embarazo.   Concurra a todas las visitas de control como se lo haya indicado el mdico. Esto es importante.  SOLICITE AYUDA SI:   Su nivel de glucosa en la sangre es igual o superior a 240mg/dl (13,3mmol/dl).   Su nivel de glucosa en la sangre es igual o superior a 200mg/dl (11,1mmol/l), y tiene cetonas en la orina.   Ha estado enferma o ha tenido fiebre durante 2o ms das y no mejora.   Si tiene alguno de estos problemas durante ms de 6horas:  ? No puede comer ni beber.  ? Siente malestar estomacal (nuseas).  ? Vomita.  ? La materia fecal es lquida (diarrea).  SOLICITE AYUDA DE INMEDIATO SI:   El nivel de glucosa en la sangre est por debajo de 54mg/dl (3mmol/l).   Est confundida.   Tiene dificultad para hacer lo siguiente:  ? Pensar con claridad.  ? Respirar.   El beb se mueve menos de lo normal.   Tiene los siguientes sntomas:  ? Niveles moderados o altos de cetonas en la orina.  ? Hemorragia vaginal.  ? Secrecin de un lquido fuera de lo comn de la vagina.  ? Contracciones prematuras. Estas pueden causar una sensacin de opresin en el vientre.  Esta informacin no tiene como fin reemplazar el consejo del mdico. Asegrese de hacerle al mdico cualquier pregunta que tenga.  Document Released: 01/06/2016 Document Revised: 01/06/2016 Document Reviewed: 10/18/2015  Elsevier Interactive Patient Education  2018 Elsevier Inc.

## 2017-12-29 LAB — HIV ANTIBODY (ROUTINE TESTING W REFLEX): HIV Screen 4th Generation wRfx: NONREACTIVE

## 2017-12-29 LAB — CBC
HEMATOCRIT: 34.9 % (ref 34.0–46.6)
Hemoglobin: 11.9 g/dL (ref 11.1–15.9)
MCH: 34.3 pg — ABNORMAL HIGH (ref 26.6–33.0)
MCHC: 34.1 g/dL (ref 31.5–35.7)
MCV: 101 fL — AB (ref 79–97)
Platelets: 158 10*3/uL (ref 150–379)
RBC: 3.47 x10E6/uL — AB (ref 3.77–5.28)
RDW: 14.4 % (ref 12.3–15.4)
WBC: 5.1 10*3/uL (ref 3.4–10.8)

## 2017-12-29 LAB — RPR: RPR Ser Ql: NONREACTIVE

## 2018-01-04 ENCOUNTER — Ambulatory Visit (HOSPITAL_COMMUNITY)
Admission: RE | Admit: 2018-01-04 | Discharge: 2018-01-04 | Disposition: A | Discharge status: Home / Self Care | Payer: Self-pay | Source: Ambulatory Visit | Attending: Obstetrics and Gynecology | Admitting: Obstetrics and Gynecology

## 2018-01-04 ENCOUNTER — Encounter (HOSPITAL_COMMUNITY): Payer: Self-pay

## 2018-01-04 ENCOUNTER — Other Ambulatory Visit (HOSPITAL_COMMUNITY): Payer: Self-pay | Admitting: *Deleted

## 2018-01-04 ENCOUNTER — Other Ambulatory Visit (HOSPITAL_COMMUNITY): Payer: Self-pay | Admitting: Maternal and Fetal Medicine

## 2018-01-04 DIAGNOSIS — O3413 Maternal care for benign tumor of corpus uteri, third trimester: Secondary | ICD-10-CM

## 2018-01-04 DIAGNOSIS — Z3A28 28 weeks gestation of pregnancy: Secondary | ICD-10-CM | POA: Insufficient documentation

## 2018-01-04 DIAGNOSIS — D259 Leiomyoma of uterus, unspecified: Secondary | ICD-10-CM

## 2018-01-04 DIAGNOSIS — O09523 Supervision of elderly multigravida, third trimester: Secondary | ICD-10-CM

## 2018-01-04 DIAGNOSIS — O09529 Supervision of elderly multigravida, unspecified trimester: Secondary | ICD-10-CM

## 2018-01-04 DIAGNOSIS — O09299 Supervision of pregnancy with other poor reproductive or obstetric history, unspecified trimester: Secondary | ICD-10-CM

## 2018-01-04 DIAGNOSIS — O0990 Supervision of high risk pregnancy, unspecified, unspecified trimester: Secondary | ICD-10-CM

## 2018-01-04 DIAGNOSIS — O09293 Supervision of pregnancy with other poor reproductive or obstetric history, third trimester: Secondary | ICD-10-CM | POA: Insufficient documentation

## 2018-01-04 DIAGNOSIS — O24415 Gestational diabetes mellitus in pregnancy, controlled by oral hypoglycemic drugs: Secondary | ICD-10-CM | POA: Insufficient documentation

## 2018-01-04 DIAGNOSIS — Z362 Encounter for other antenatal screening follow-up: Secondary | ICD-10-CM | POA: Insufficient documentation

## 2018-01-04 DIAGNOSIS — K429 Umbilical hernia without obstruction or gangrene: Secondary | ICD-10-CM

## 2018-01-04 NOTE — Addendum Note (Signed)
Encounter addended by: Novella Rob, RDMS on: 01/04/2018 10:01 AM  Actions taken: Imaging Exam ended

## 2018-01-10 ENCOUNTER — Ambulatory Visit (INDEPENDENT_AMBULATORY_CARE_PROVIDER_SITE_OTHER): Payer: Self-pay | Admitting: Obstetrics and Gynecology

## 2018-01-10 ENCOUNTER — Encounter: Payer: Self-pay | Admitting: Obstetrics and Gynecology

## 2018-01-10 VITALS — BP 107/56 | HR 62 | Wt 164.0 lb

## 2018-01-10 DIAGNOSIS — O24419 Gestational diabetes mellitus in pregnancy, unspecified control: Secondary | ICD-10-CM

## 2018-01-10 DIAGNOSIS — O09299 Supervision of pregnancy with other poor reproductive or obstetric history, unspecified trimester: Secondary | ICD-10-CM

## 2018-01-10 DIAGNOSIS — O0990 Supervision of high risk pregnancy, unspecified, unspecified trimester: Secondary | ICD-10-CM

## 2018-01-10 DIAGNOSIS — O09521 Supervision of elderly multigravida, first trimester: Secondary | ICD-10-CM

## 2018-01-10 LAB — POCT URINALYSIS DIP (DEVICE)
BILIRUBIN URINE: NEGATIVE
GLUCOSE, UA: 100 mg/dL — AB
Ketones, ur: NEGATIVE mg/dL
Leukocytes, UA: NEGATIVE
Nitrite: NEGATIVE
Protein, ur: NEGATIVE mg/dL
SPECIFIC GRAVITY, URINE: 1.015 (ref 1.005–1.030)
UROBILINOGEN UA: 0.2 mg/dL (ref 0.0–1.0)
pH: 6.5 (ref 5.0–8.0)

## 2018-01-10 NOTE — Progress Notes (Signed)
Subjective:  Lynn Burnett is a 37 y.o. [phone] at [redacted]w[redacted]d being seen today for ongoing prenatal care.  She is currently monitored for the following issues for this high-risk pregnancy and has GDM, class A2; History of gestational diabetes; History of maternal fourth degree perineal laceration, currently pregnant; Supervision of high risk pregnancy, antepartum; AMA (advanced maternal age) multigravida 29+, first trimester; History of oligohydramnios in prior pregnancy, currently pregnant; History of delivery by vacuum extraction, currently pregnant; and Umbilical hernia on their problem list.  Patient reports no complaints.  Contractions: Not present. Vag. Bleeding: None.  Movement: Present. Denies leaking of fluid.   The following portions of the patient's history were reviewed and updated as appropriate: allergies, current medications, past family history, past medical history, past social history, past surgical history and problem list. Problem list updated.  Objective:   Vitals:   01/10/18 1029  BP: (!) 107/56  Pulse: 62  Weight: 164 lb (74.4 kg)    Fetal Status: Fetal Heart Rate (bpm): 150   Movement: Present     General:  Alert, oriented and cooperative. Patient is in no acute distress.  Skin: Skin is warm and dry. No rash noted.   Cardiovascular: Normal heart rate noted  Respiratory: Normal respiratory effort, no problems with respiration noted  Abdomen: Soft, gravid, appropriate for gestational age. Pain/Pressure: Absent     Pelvic:  Cervical exam deferred        Extremities: Normal range of motion.  Edema: None  Mental Status: Normal mood and affect. Normal behavior. Normal judgment and thought content.   Urinalysis: Urine Protein: Negative Urine Glucose: 1+  Assessment and Plan:  Pregnancy: [id number] at [redacted]w[redacted]d  1. GDM, class A2 CBG's goal range except a few outliners related to food choices Diet reviewed U/S 01/04/18 73 % growth F/U ordered Fetal ECHO 01/25/18 Continue  with Glyburide Start antenatal testing at 32 weeks  2. Supervision of high risk pregnancy, antepartum Stable  3. History of maternal fourth degree perineal laceration, currently pregnant Desires vaginal dleivery  4. AMA (advanced maternal age) multigravida 48+, first trimester Decline genetic studies  Preterm labor symptoms and general obstetric precautions including but not limited to vaginal bleeding, contractions, leaking of fluid and fetal movement were reviewed in detail with the patient. Please refer to After Visit Summary for other counseling recommendations.  Return in about 2 weeks (around 01/24/2018) for OB visit.   Chancy Milroy, MD

## 2018-01-24 ENCOUNTER — Ambulatory Visit (INDEPENDENT_AMBULATORY_CARE_PROVIDER_SITE_OTHER): Payer: Self-pay | Admitting: Obstetrics & Gynecology

## 2018-01-24 ENCOUNTER — Ambulatory Visit: Payer: Self-pay | Admitting: Obstetrics & Gynecology

## 2018-01-24 VITALS — BP 99/60 | HR 69 | Wt 166.8 lb

## 2018-01-24 DIAGNOSIS — O24419 Gestational diabetes mellitus in pregnancy, unspecified control: Secondary | ICD-10-CM

## 2018-01-24 DIAGNOSIS — O0990 Supervision of high risk pregnancy, unspecified, unspecified trimester: Secondary | ICD-10-CM

## 2018-01-24 DIAGNOSIS — O09299 Supervision of pregnancy with other poor reproductive or obstetric history, unspecified trimester: Secondary | ICD-10-CM

## 2018-01-24 DIAGNOSIS — O09521 Supervision of elderly multigravida, first trimester: Secondary | ICD-10-CM

## 2018-01-24 LAB — POCT URINALYSIS DIP (DEVICE)
BILIRUBIN URINE: NEGATIVE
Glucose, UA: NEGATIVE mg/dL
Hgb urine dipstick: NEGATIVE
KETONES UR: NEGATIVE mg/dL
Leukocytes, UA: NEGATIVE
Nitrite: NEGATIVE
PROTEIN: NEGATIVE mg/dL
SPECIFIC GRAVITY, URINE: 1.01 (ref 1.005–1.030)
Urobilinogen, UA: 0.2 mg/dL (ref 0.0–1.0)
pH: 6 (ref 5.0–8.0)

## 2018-01-24 NOTE — Patient Instructions (Signed)

## 2018-01-24 NOTE — Progress Notes (Signed)
   PRENATAL VISIT NOTE  Subjective:  Lynn Burnett is a 37 y.o. [phone] at [redacted]w[redacted]d being seen today for ongoing prenatal care.  She is currently monitored for the following issues for this high-risk pregnancy and has GDM, class A2; History of gestational diabetes; History of maternal fourth degree perineal laceration, currently pregnant; Supervision of high risk pregnancy, antepartum; AMA (advanced maternal age) multigravida 52+, first trimester; History of oligohydramnios in prior pregnancy, currently pregnant; History of delivery by vacuum extraction, currently pregnant; and Umbilical hernia on their problem list.  Patient reports no complaints.  Contractions: Not present. Vag. Bleeding: None.  Movement: Present. Denies leaking of fluid.   The following portions of the patient's history were reviewed and updated as appropriate: allergies, current medications, past family history, past medical history, past social history, past surgical history and problem list. Problem list updated.  Objective:   Vitals:   01/24/18 1548  BP: 99/60  Pulse: 69  Weight: 166 lb 12.8 oz (75.7 kg)    Fetal Status: Fetal Heart Rate (bpm): 135   Movement: Present     General:  Alert, oriented and cooperative. Patient is in no acute distress.  Skin: Skin is warm and dry. No rash noted.   Cardiovascular: Normal heart rate noted  Respiratory: Normal respiratory effort, no problems with respiration noted  Abdomen: Soft, gravid, appropriate for gestational age.  Pain/Pressure: Present     Pelvic: Cervical exam deferred        Extremities: Normal range of motion.  Edema: None  Mental Status: Normal mood and affect. Normal behavior. Normal judgment and thought content.   Assessment and Plan:  Pregnancy: [id number] at [redacted]w[redacted]d  1. Supervision of high risk pregnancy, antepartum Next week - [place] MFM FETAL BPP WO NON STRESS; Future  2. GDM, class A2 FBS 75% in range and PP 90% nl - [place] MFM FETAL BPP WO NON STRESS;  Future  3. AMA (advanced maternal age) multigravida 55+, first trimester  - [place] MFM FETAL BPP WO NON STRESS; Future  Preterm labor symptoms and general obstetric precautions including but not limited to vaginal bleeding, contractions, leaking of fluid and fetal movement were reviewed in detail with the patient. Please refer to After Visit Summary for other counseling recommendations.  Return in about 8 days (around 02/01/2018) for NST only and [facility] - has [place] @ 0815.  Future Appointments  Date Time Provider [place]  01/24/2018  4:15 PM Woodroe Mode, MD [facility]  02/01/2018  8:15 AM [place] 4 [id number] [id number]    Emeterio Reeve, MD

## 2018-01-24 NOTE — Progress Notes (Signed)
   PRENATAL VISIT NOTE  Subjective:  Lynn Burnett is a 37 y.o. [phone] at [redacted]w[redacted]d being seen today for ongoing prenatal care.  She is currently monitored for the following issues for this high-risk pregnancy and has GDM, class A2; History of gestational diabetes; History of maternal fourth degree perineal laceration, currently pregnant; Supervision of high risk pregnancy, antepartum; AMA (advanced maternal age) multigravida 69+, first trimester; History of oligohydramnios in prior pregnancy, currently pregnant; History of delivery by vacuum extraction, currently pregnant; and Umbilical hernia on their problem list.  Patient reports no complaints.  Contractions: Not present. Vag. Bleeding: None.  Movement: Present. Denies leaking of fluid.   The following portions of the patient's history were reviewed and updated as appropriate: allergies, current medications, past family history, past medical history, past social history, past surgical history and problem list. Problem list updated.  Objective:   Vitals:   01/24/18 1548  BP: 99/60  Pulse: 69  Weight: 166 lb 12.8 oz (75.7 kg)    Fetal Status: Fetal Heart Rate (bpm): 135 Fundal Height: 34 cm Movement: Present     General:  Alert, oriented and cooperative. Patient is in no acute distress.  Skin: Skin is warm and dry. No rash noted.   Cardiovascular: Normal heart rate noted  Respiratory: Normal respiratory effort, no problems with respiration noted  Abdomen: Soft, gravid, appropriate for gestational age.  Pain/Pressure: Present     Pelvic: Cervical exam deferred        Extremities: Normal range of motion.  Edema: None  Mental Status: Normal mood and affect. Normal behavior. Normal judgment and thought content.   Assessment and Plan:  Pregnancy: [id number] at [redacted]w[redacted]d  1. Supervision of high risk pregnancy, antepartum  - [place] MFM FETAL BPP WO NON STRESS; Future  2. GDM, class A2  - [place] MFM FETAL BPP WO NON STRESS; Future  3. AMA  (advanced maternal age) multigravida 34+, first trimester  - [place] MFM FETAL BPP WO NON STRESS; Future  4. History of maternal fourth degree perineal laceration, currently pregnant No change of medications  Preterm labor symptoms and general obstetric precautions including but not limited to vaginal bleeding, contractions, leaking of fluid and fetal movement were reviewed in detail with the patient. Please refer to After Visit Summary for other counseling recommendations.  Return in about 8 days (around 02/01/2018) for NST only and [facility] - has [place] @ 0815.  Future Appointments  Date Time Provider [place]  01/24/2018  4:15 PM Woodroe Mode, MD [facility] WOC  02/01/2018  8:15 AM [place] 4 WH-MFCUS MFC-US    Emeterio Reeve, MD

## 2018-01-25 NOTE — Progress Notes (Deleted)
Opened in error

## 2018-02-01 ENCOUNTER — Ambulatory Visit (INDEPENDENT_AMBULATORY_CARE_PROVIDER_SITE_OTHER): Payer: Self-pay | Admitting: *Deleted

## 2018-02-01 ENCOUNTER — Ambulatory Visit (INDEPENDENT_AMBULATORY_CARE_PROVIDER_SITE_OTHER): Payer: Self-pay | Admitting: Nurse Practitioner

## 2018-02-01 ENCOUNTER — Ambulatory Visit (HOSPITAL_COMMUNITY)
Admission: RE | Admit: 2018-02-01 | Discharge: 2018-02-01 | Disposition: A | Discharge status: Home / Self Care | Payer: Self-pay | Source: Ambulatory Visit | Attending: Obstetrics and Gynecology | Admitting: Obstetrics and Gynecology

## 2018-02-01 ENCOUNTER — Other Ambulatory Visit (HOSPITAL_COMMUNITY): Payer: Self-pay | Admitting: *Deleted

## 2018-02-01 ENCOUNTER — Encounter (HOSPITAL_COMMUNITY): Payer: Self-pay

## 2018-02-01 VITALS — BP 99/59 | HR 72 | Wt 164.3 lb

## 2018-02-01 DIAGNOSIS — O09521 Supervision of elderly multigravida, first trimester: Secondary | ICD-10-CM

## 2018-02-01 DIAGNOSIS — O0990 Supervision of high risk pregnancy, unspecified, unspecified trimester: Secondary | ICD-10-CM

## 2018-02-01 DIAGNOSIS — O24419 Gestational diabetes mellitus in pregnancy, unspecified control: Secondary | ICD-10-CM

## 2018-02-01 DIAGNOSIS — O24415 Gestational diabetes mellitus in pregnancy, controlled by oral hypoglycemic drugs: Secondary | ICD-10-CM | POA: Insufficient documentation

## 2018-02-01 DIAGNOSIS — Z3A32 32 weeks gestation of pregnancy: Secondary | ICD-10-CM | POA: Insufficient documentation

## 2018-02-01 DIAGNOSIS — O09293 Supervision of pregnancy with other poor reproductive or obstetric history, third trimester: Secondary | ICD-10-CM | POA: Insufficient documentation

## 2018-02-01 DIAGNOSIS — D259 Leiomyoma of uterus, unspecified: Secondary | ICD-10-CM | POA: Insufficient documentation

## 2018-02-01 DIAGNOSIS — O3413 Maternal care for benign tumor of corpus uteri, third trimester: Secondary | ICD-10-CM | POA: Insufficient documentation

## 2018-02-01 DIAGNOSIS — O09523 Supervision of elderly multigravida, third trimester: Secondary | ICD-10-CM | POA: Insufficient documentation

## 2018-02-01 LAB — POCT URINALYSIS DIP (DEVICE)
BILIRUBIN URINE: NEGATIVE
Glucose, UA: NEGATIVE mg/dL
KETONES UR: NEGATIVE mg/dL
Leukocytes, UA: NEGATIVE
NITRITE: NEGATIVE
PH: 6.5 (ref 5.0–8.0)
Protein, ur: NEGATIVE mg/dL
Specific Gravity, Urine: 1.015 (ref 1.005–1.030)
Urobilinogen, UA: 0.2 mg/dL (ref 0.0–1.0)

## 2018-02-01 NOTE — Progress Notes (Signed)
    Subjective:  Lynn Burnett is a 37 y.o. [phone] at [redacted]w[redacted]d being seen today for ongoing prenatal care.  She is currently monitored for the following issues for this high-risk pregnancy and has GDM, class A2; History of gestational diabetes; History of maternal fourth degree perineal laceration, currently pregnant; Supervision of high risk pregnancy, antepartum; AMA (advanced maternal age) multigravida 31+, first trimester; History of oligohydramnios in prior pregnancy, currently pregnant; History of delivery by vacuum extraction, currently pregnant; and Umbilical hernia on their problem list.  Patient reports no complaints.  Contractions: Not present. Vag. Bleeding: None.  Movement: Present. Denies leaking of fluid.   The following portions of the patient's history were reviewed and updated as appropriate: allergies, current medications, past family history, past medical history, past social history, past surgical history and problem list. Problem list updated.  Objective:   Vitals:   02/01/18 1016  BP: (!) 99/59  Pulse: 72  Weight: 164 lb 4.8 oz (74.5 kg)    Fetal Status: Fetal Heart Rate (bpm): NST   Movement: Present     General:  Alert, oriented and cooperative. Patient is in no acute distress.  Skin: Skin is warm and dry. No rash noted.   Cardiovascular: Normal heart rate noted  Respiratory: Normal respiratory effort, no problems with respiration noted  Abdomen: Soft, gravid, appropriate for gestational age. Pain/Pressure: Present     Pelvic:  Cervical exam deferred        Extremities: Normal range of motion.  Edema: None  Mental Status: Normal mood and affect. Normal behavior. Normal judgment and thought content.   Urinalysis: Urine Protein: Negative Urine Glucose: Negative  Assessment and Plan:  Pregnancy: [id number] at [redacted]w[redacted]d  1. Supervision of high risk pregnancy, antepartum Had NST and BPP done today and will continue weekly ROB in 2 weeks BPP 8/8 today and EFW at  79%  2. GDM, class A2 Reviewed booklet of glucose testing - no changes are indicated in her sugars are in normal range 98% of the time  3. AMA (advanced maternal age) multigravida 50+, See above  Preterm labor symptoms and general obstetric precautions including but not limited to vaginal bleeding, contractions, leaking of fluid and fetal movement were reviewed in detail with the patient. Please refer to After Visit Summary for other counseling recommendations.  Return in about 1 week (around 02/08/2018) for NST/BPP and HOB weekly.  Earlie Server, RN, MSN, NP-BC Nurse Practitioner, [facility] for [employer], [place] [facility] 02/01/2018 11:00 AM

## 2018-02-01 NOTE — Progress Notes (Signed)
Pt states Fetal echo done last week- was told all normal.  [place] for growth and BPP done today.

## 2018-02-08 ENCOUNTER — Ambulatory Visit: Payer: Self-pay

## 2018-02-08 ENCOUNTER — Ambulatory Visit (INDEPENDENT_AMBULATORY_CARE_PROVIDER_SITE_OTHER): Payer: Self-pay | Admitting: *Deleted

## 2018-02-08 VITALS — BP 103/61 | HR 72 | Wt 164.5 lb

## 2018-02-08 DIAGNOSIS — O24419 Gestational diabetes mellitus in pregnancy, unspecified control: Secondary | ICD-10-CM

## 2018-02-08 NOTE — Progress Notes (Signed)

## 2018-02-08 NOTE — Progress Notes (Addendum)
NST:  Baseline: 135 bpm, Variability: Good {> 6 bpm), Accelerations: Reactive and Decelerations: Absent   

## 2018-02-15 ENCOUNTER — Ambulatory Visit (INDEPENDENT_AMBULATORY_CARE_PROVIDER_SITE_OTHER): Payer: Self-pay | Admitting: Obstetrics & Gynecology

## 2018-02-15 ENCOUNTER — Ambulatory Visit (INDEPENDENT_AMBULATORY_CARE_PROVIDER_SITE_OTHER): Payer: Self-pay | Admitting: *Deleted

## 2018-02-15 ENCOUNTER — Ambulatory Visit: Payer: Self-pay

## 2018-02-15 VITALS — BP 103/63 | HR 70 | Wt 164.0 lb

## 2018-02-15 DIAGNOSIS — O24419 Gestational diabetes mellitus in pregnancy, unspecified control: Secondary | ICD-10-CM

## 2018-02-15 DIAGNOSIS — O09523 Supervision of elderly multigravida, third trimester: Secondary | ICD-10-CM

## 2018-02-15 DIAGNOSIS — O0990 Supervision of high risk pregnancy, unspecified, unspecified trimester: Secondary | ICD-10-CM

## 2018-02-15 DIAGNOSIS — O0993 Supervision of high risk pregnancy, unspecified, third trimester: Secondary | ICD-10-CM

## 2018-02-15 DIAGNOSIS — O09521 Supervision of elderly multigravida, first trimester: Secondary | ICD-10-CM

## 2018-02-15 MED ORDER — GLYBURIDE 2.5 MG PO TABS: 2.5000 mg | ORAL_TABLET | Freq: Every day | ORAL | 3 refills | Status: DC

## 2018-02-15 NOTE — Progress Notes (Signed)
Pt did not take Glyburide last night - ran out of medication - will obtain from pharmacy today. [place] for growth and BPP scheduled on 6/4

## 2018-02-15 NOTE — Progress Notes (Signed)
I have reviewed this chart and agree with the RN/CMA assessment and management.    Rodney Yera C Denijah Karrer, MD, FACOG Attending Physician, [facility]  

## 2018-02-15 NOTE — Progress Notes (Signed)
   PRENATAL VISIT NOTE  Subjective:  Lynn Burnett is a 37 y.o. [phone] at [redacted]w[redacted]d being seen today for ongoing prenatal care.  She is currently monitored for the following issues for this high-risk pregnancy and has GDM, class A2; History of gestational diabetes; History of maternal fourth degree perineal laceration, currently pregnant; Supervision of high risk pregnancy, antepartum; AMA (advanced maternal age) multigravida 60+, first trimester; History of oligohydramnios in prior pregnancy, currently pregnant; History of delivery by vacuum extraction, currently pregnant; and Umbilical hernia on their problem list.  Patient reports no complaints.  Contractions: Irregular. Vag. Bleeding: None.  Movement: Present. Denies leaking of fluid.   The following portions of the patient's history were reviewed and updated as appropriate: allergies, current medications, past family history, past medical history, past social history, past surgical history and problem list. Problem list updated.  Objective:   Vitals:   02/15/18 0958  BP: 103/63  Pulse: 70  Weight: 164 lb (74.4 kg)    Fetal Status: Fetal Heart Rate (bpm): NST Fundal Height: 36 cm Movement: Present     General:  Alert, oriented and cooperative. Patient is in no acute distress.  Skin: Skin is warm and dry. No rash noted.   Cardiovascular: Normal heart rate noted  Respiratory: Normal respiratory effort, no problems with respiration noted  Abdomen: Soft, gravid, appropriate for gestational age.  Pain/Pressure: Present     Pelvic: Cervical exam deferred        Extremities: Normal range of motion.  Edema: Trace  Mental Status: Normal mood and affect. Normal behavior. Normal judgment and thought content.   Assessment and Plan:  Pregnancy: [id number] at [redacted]w[redacted]d  1. AMA (advanced maternal age) multigravida 103+, first trimester   2. GDM, class A2 - Her sugars are fantastic  3. Supervision of high risk pregnancy, antepartum   Preterm  labor symptoms and general obstetric precautions including but not limited to vaginal bleeding, contractions, leaking of fluid and fetal movement were reviewed in detail with the patient. Please refer to After Visit Summary for other counseling recommendations.  Return in about 1 week (around 02/22/2018) for as scheduled.  Future Appointments  Date Time Provider [place]  02/22/2018  9:15 AM WOC-WOCA NST WOC-WOCA WOC  03/01/2018  8:30 AM WH-MFC [place] 1 WH-MFCUS MFC-US  03/01/2018 10:15 AM Constant, Vickii Chafe, MD [place] WOC  03/01/2018 11:15 AM WOC-WOCA NST WOC-WOCA WOC  03/08/2018  9:15 AM WOC-WOCA NST WOC-WOCA WOC  03/08/2018 10:15 AM Donnamae Jude, MD [place] WOC  03/15/2018  9:15 AM WOC-WOCA NST WOC-WOCA WOC  03/15/2018 10:15 AM Aletha Halim, MD WOC-WOCA WOC    Emily Filbert, MD

## 2018-02-15 NOTE — Progress Notes (Signed)

## 2018-02-22 ENCOUNTER — Ambulatory Visit (INDEPENDENT_AMBULATORY_CARE_PROVIDER_SITE_OTHER): Payer: Self-pay | Admitting: *Deleted

## 2018-02-22 ENCOUNTER — Ambulatory Visit: Payer: Self-pay

## 2018-02-22 VITALS — BP 95/58 | HR 63 | Wt 167.7 lb

## 2018-02-22 DIAGNOSIS — O09521 Supervision of elderly multigravida, first trimester: Secondary | ICD-10-CM

## 2018-02-22 DIAGNOSIS — O24419 Gestational diabetes mellitus in pregnancy, unspecified control: Secondary | ICD-10-CM

## 2018-02-22 NOTE — Progress Notes (Signed)
  NST:  Baseline: 140 bpm, Variability: Good {> 6 bpm), Accelerations: Reactive and Decelerations: Absent   

## 2018-02-22 NOTE — Progress Notes (Signed)
Pt informed that the ultrasound is considered a limited OB ultrasound and is not intended to be a complete ultrasound exam.  Patient also informed that the ultrasound is not being completed with the intent of assessing for fetal or placental anomalies or any pelvic abnormalities.  Explained that the purpose of today's ultrasound is to assess for presentation, BPP and amniotic fluid volume.  Patient acknowledges the purpose of the exam and the limitations of the study.    CBG log reviewed - 2 PP values out of range (130, 124) due to diet per pt.

## 2018-03-01 ENCOUNTER — Encounter: Payer: Self-pay | Admitting: Obstetrics and Gynecology

## 2018-03-01 ENCOUNTER — Other Ambulatory Visit: Payer: Self-pay

## 2018-03-01 ENCOUNTER — Other Ambulatory Visit (HOSPITAL_COMMUNITY): Payer: Self-pay | Admitting: Maternal and Fetal Medicine

## 2018-03-01 ENCOUNTER — Encounter (HOSPITAL_COMMUNITY): Payer: Self-pay

## 2018-03-01 ENCOUNTER — Ambulatory Visit (HOSPITAL_COMMUNITY)
Admission: RE | Admit: 2018-03-01 | Discharge: 2018-03-01 | Disposition: A | Discharge status: Home / Self Care | Payer: Self-pay | Source: Ambulatory Visit | Attending: Obstetrics and Gynecology | Admitting: Obstetrics and Gynecology

## 2018-03-01 ENCOUNTER — Ambulatory Visit (INDEPENDENT_AMBULATORY_CARE_PROVIDER_SITE_OTHER): Payer: Self-pay | Admitting: Obstetrics and Gynecology

## 2018-03-01 ENCOUNTER — Ambulatory Visit (INDEPENDENT_AMBULATORY_CARE_PROVIDER_SITE_OTHER): Payer: Self-pay | Admitting: *Deleted

## 2018-03-01 VITALS — BP 106/65 | HR 61 | Wt 172.0 lb

## 2018-03-01 DIAGNOSIS — O24419 Gestational diabetes mellitus in pregnancy, unspecified control: Secondary | ICD-10-CM

## 2018-03-01 DIAGNOSIS — Z3A36 36 weeks gestation of pregnancy: Secondary | ICD-10-CM | POA: Insufficient documentation

## 2018-03-01 DIAGNOSIS — O09523 Supervision of elderly multigravida, third trimester: Secondary | ICD-10-CM

## 2018-03-01 DIAGNOSIS — Z113 Encounter for screening for infections with a predominantly sexual mode of transmission: Secondary | ICD-10-CM

## 2018-03-01 DIAGNOSIS — Z362 Encounter for other antenatal screening follow-up: Secondary | ICD-10-CM

## 2018-03-01 DIAGNOSIS — O0990 Supervision of high risk pregnancy, unspecified, unspecified trimester: Secondary | ICD-10-CM

## 2018-03-01 DIAGNOSIS — O09299 Supervision of pregnancy with other poor reproductive or obstetric history, unspecified trimester: Secondary | ICD-10-CM | POA: Insufficient documentation

## 2018-03-01 DIAGNOSIS — O24415 Gestational diabetes mellitus in pregnancy, controlled by oral hypoglycemic drugs: Secondary | ICD-10-CM

## 2018-03-01 DIAGNOSIS — O09521 Supervision of elderly multigravida, first trimester: Secondary | ICD-10-CM

## 2018-03-01 NOTE — Progress Notes (Signed)
   PRENATAL VISIT NOTE  Subjective:  Lynn Burnett is a 37 y.o. [phone] at [redacted]w[redacted]d being seen today for ongoing prenatal care.  She is currently monitored for the following issues for this high-risk pregnancy and has GDM, class A2; History of gestational diabetes; History of maternal fourth degree perineal laceration, currently pregnant; Supervision of high risk pregnancy, antepartum; AMA (advanced maternal age) multigravida 48+, first trimester; History of oligohydramnios in prior pregnancy, currently pregnant; History of delivery by vacuum extraction, currently pregnant; and Umbilical hernia on their problem list.  Patient reports no complaints.  Contractions: Irregular. Vag. Bleeding: None.  Movement: Present. Denies leaking of fluid.   The following portions of the patient's history were reviewed and updated as appropriate: allergies, current medications, past family history, past medical history, past social history, past surgical history and problem list. Problem list updated.  Objective:   Vitals:   03/01/18 1025  BP: 106/65  Pulse: 61  Weight: 172 lb (78 kg)    Fetal Status: Fetal Heart Rate (bpm): NST/US Fundal Height: 37 cm Movement: Present     General:  Alert, oriented and cooperative. Patient is in no acute distress.  Skin: Skin is warm and dry. No rash noted.   Cardiovascular: Normal heart rate noted  Respiratory: Normal respiratory effort, no problems with respiration noted  Abdomen: Soft, gravid, appropriate for gestational age.  Pain/Pressure: Present     Pelvic: Cervical exam performed Dilation: 1 Effacement (%): 50 Station: Ballotable  Extremities: Normal range of motion.  Edema: Mild pitting, slight indentation  Mental Status: Normal mood and affect. Normal behavior. Normal judgment and thought content.   Assessment and Plan:  Pregnancy: [id number] at [redacted]w[redacted]d  1. Supervision of high risk pregnancy, antepartum Patient is doing well without complaints Cultures  collected  2. GDM, class A2 CBGs reviewed and all within range Continue glyburide 03/01/2018 ultrasound EFW 3367 gm (88%tile) Reviewed ultrasound results with the patient Continue antenatal testing NST reviewed and reactive with baseline 140, mod variability, +accels, no decels   3. AMA (advanced maternal age) multigravida 63+, first trimester   Preterm labor symptoms and general obstetric precautions including but not limited to vaginal bleeding, contractions, leaking of fluid and fetal movement were reviewed in detail with the patient. Please refer to After Visit Summary for other counseling recommendations.  No follow-ups on file.  Future Appointments  Date Time Provider [place]  03/01/2018 11:15 AM WOC-WOCA NST WOC-WOCA WOC  03/08/2018  9:15 AM WOC-WOCA NST WOC-WOCA WOC  03/08/2018 10:15 AM Donnamae Jude, MD WOC-WOCA WOC  03/15/2018  9:15 AM WOC-WOCA NST WOC-WOCA WOC  03/15/2018 10:15 AM Aletha Halim, MD [facility] WOC    Mora Bellman, MD

## 2018-03-02 LAB — GC/CHLAMYDIA PROBE AMP (~~LOC~~) NOT AT ARMC
CHLAMYDIA, DNA PROBE: NEGATIVE
Neisseria Gonorrhea: NEGATIVE

## 2018-03-05 LAB — CULTURE, BETA STREP (GROUP B ONLY): Strep Gp B Culture: NEGATIVE

## 2018-03-08 ENCOUNTER — Telehealth (HOSPITAL_COMMUNITY): Payer: Self-pay | Admitting: *Deleted

## 2018-03-08 ENCOUNTER — Ambulatory Visit (INDEPENDENT_AMBULATORY_CARE_PROVIDER_SITE_OTHER): Payer: Self-pay | Admitting: *Deleted

## 2018-03-08 ENCOUNTER — Ambulatory Visit (INDEPENDENT_AMBULATORY_CARE_PROVIDER_SITE_OTHER): Payer: Self-pay | Admitting: Family Medicine

## 2018-03-08 ENCOUNTER — Ambulatory Visit: Payer: Self-pay

## 2018-03-08 VITALS — BP 101/61 | HR 58 | Wt 171.5 lb

## 2018-03-08 DIAGNOSIS — O0990 Supervision of high risk pregnancy, unspecified, unspecified trimester: Secondary | ICD-10-CM

## 2018-03-08 DIAGNOSIS — O24419 Gestational diabetes mellitus in pregnancy, unspecified control: Secondary | ICD-10-CM

## 2018-03-08 DIAGNOSIS — O0993 Supervision of high risk pregnancy, unspecified, third trimester: Secondary | ICD-10-CM

## 2018-03-08 LAB — POCT URINALYSIS DIP (DEVICE)
Bilirubin Urine: NEGATIVE
Glucose, UA: NEGATIVE mg/dL
Ketones, ur: NEGATIVE mg/dL
Nitrite: NEGATIVE
Protein, ur: NEGATIVE mg/dL
Specific Gravity, Urine: 1.01 (ref 1.005–1.030)
Urobilinogen, UA: 0.2 mg/dL (ref 0.0–1.0)
pH: 6 (ref 5.0–8.0)

## 2018-03-08 NOTE — Progress Notes (Signed)
IOL scheduled on 6/20 @ 0730

## 2018-03-08 NOTE — Patient Instructions (Signed)

## 2018-03-08 NOTE — Telephone Encounter (Signed)
Preadmission screen  

## 2018-03-08 NOTE — Progress Notes (Addendum)
   PRENATAL VISIT NOTE  Subjective:  Lynn Burnett is a 37 y.o. [phone] at [redacted]w[redacted]d being seen today for ongoing prenatal care.  She is currently monitored for the following issues for this high-risk pregnancy and has GDM, class A2; History of gestational diabetes; History of maternal fourth degree perineal laceration, currently pregnant; Supervision of high risk pregnancy, antepartum; AMA (advanced maternal age) multigravida 19+, first trimester; History of oligohydramnios in prior pregnancy, currently pregnant; History of delivery by vacuum extraction, currently pregnant; and Umbilical hernia on their problem list.  Patient reports no complaints.  Contractions: Irregular. Vag. Bleeding: None.  Movement: Present. Denies leaking of fluid.   The following portions of the patient's history were reviewed and updated as appropriate: allergies, current medications, past family history, past medical history, past social history, past surgical history and problem list. Problem list updated.  Objective:   Vitals:   03/08/18 0955  BP: 101/61  Pulse: (!) 58  Weight: 171 lb 8 oz (77.8 kg)    Fetal Status: Fetal Heart Rate (bpm): NST   Movement: Present     General:  Alert, oriented and cooperative. Patient is in no acute distress.  Skin: Skin is warm and dry. No rash noted.   Cardiovascular: Normal heart rate noted  Respiratory: Normal respiratory effort, no problems with respiration noted  Abdomen: Soft, gravid, appropriate for gestational age.  Pain/Pressure: Present     Pelvic: Cervical exam deferred        Extremities: Normal range of motion.  Edema: Trace  Mental Status: Normal mood and affect. Normal behavior. Normal judgment and thought content.  FBS 68-119 (2 of 8 out of range) 2 hour pp 77-167 (2 of 28 out of range) NST:  Baseline: 120 bpm, Variability: Good {> 6 bpm), Accelerations: Reactive and Decelerations: Absent 03/01/18 U/s vtx, nml AFI, 3367 gms, 7 lb 7 oz 88% Assessment and  Plan:  Pregnancy: [id number] at 106w5d  1. Supervision of high risk pregnancy, antepartum   2. GDM, class A2 CBGs ok For IOL @ 39 wks, testing is WNL today  Term labor symptoms and general obstetric precautions including but not limited to vaginal bleeding, contractions, leaking of fluid and fetal movement were reviewed in detail with the patient. Please refer to After Visit Summary for other counseling recommendations.  Return in about 1 week (around 03/15/2018) for as scheduled.  Future Appointments  Date Time Provider [place]  03/15/2018  9:15 AM WOC-WOCA NST WOC-WOCA WOC  03/15/2018 10:15 AM Aletha Halim, MD WOC-WOCA WOC  03/17/2018  7:30 AM WH-BSSCHED ROOM WH-BSSCHED None    Donnamae Jude, MD

## 2018-03-08 NOTE — Progress Notes (Signed)

## 2018-03-14 ENCOUNTER — Encounter (HOSPITAL_COMMUNITY): Payer: Self-pay | Admitting: *Deleted

## 2018-03-14 ENCOUNTER — Telehealth (HOSPITAL_COMMUNITY): Payer: Self-pay | Admitting: *Deleted

## 2018-03-14 NOTE — Telephone Encounter (Signed)
Preadmission screen  

## 2018-03-15 ENCOUNTER — Ambulatory Visit: Payer: Self-pay

## 2018-03-15 ENCOUNTER — Ambulatory Visit (INDEPENDENT_AMBULATORY_CARE_PROVIDER_SITE_OTHER): Payer: Self-pay | Admitting: Obstetrics and Gynecology

## 2018-03-15 ENCOUNTER — Encounter: Payer: Self-pay | Admitting: Obstetrics and Gynecology

## 2018-03-15 ENCOUNTER — Ambulatory Visit (INDEPENDENT_AMBULATORY_CARE_PROVIDER_SITE_OTHER): Payer: Self-pay | Admitting: *Deleted

## 2018-03-15 VITALS — BP 112/64 | HR 66 | Wt 172.9 lb

## 2018-03-15 DIAGNOSIS — O24419 Gestational diabetes mellitus in pregnancy, unspecified control: Secondary | ICD-10-CM

## 2018-03-15 DIAGNOSIS — O09521 Supervision of elderly multigravida, first trimester: Secondary | ICD-10-CM

## 2018-03-15 DIAGNOSIS — O0993 Supervision of high risk pregnancy, unspecified, third trimester: Secondary | ICD-10-CM

## 2018-03-15 DIAGNOSIS — O0990 Supervision of high risk pregnancy, unspecified, unspecified trimester: Secondary | ICD-10-CM

## 2018-03-15 DIAGNOSIS — O320XX0 Maternal care for unstable lie, not applicable or unspecified: Secondary | ICD-10-CM

## 2018-03-15 DIAGNOSIS — O09523 Supervision of elderly multigravida, third trimester: Secondary | ICD-10-CM

## 2018-03-15 NOTE — Progress Notes (Signed)
Pt informed that the ultrasound is considered a limited OB ultrasound and is not intended to be a complete ultrasound exam.  Patient also informed that the ultrasound is not being completed with the intent of assessing for fetal or placental anomalies or any pelvic abnormalities.  Explained that the purpose of today's ultrasound is to assess for presentation, and amniotic fluid volume.  Patient acknowledges the purpose of the exam and the limitations of the study.

## 2018-03-15 NOTE — Progress Notes (Signed)
Prenatal Visit Note Date: 03/15/2018 Clinic: [facility]  Subjective:  Lynn Burnett is a 37 y.o. [phone] at [redacted]w[redacted]d being seen today for ongoing prenatal care.  She is currently monitored for the following issues for this high-risk pregnancy and has GDM, class A2; History of maternal fourth degree perineal laceration, currently pregnant; Supervision of high risk pregnancy, antepartum; AMA (advanced maternal age) multigravida 67+, first trimester; History of oligohydramnios in prior pregnancy, currently pregnant; History of delivery by vacuum extraction, currently pregnant; Umbilical hernia; and Unstable lie of fetus on their problem list.  Patient reports no complaints.   Contractions: Irregular. Vag. Bleeding: None.  Movement: Present. Denies leaking of fluid.   The following portions of the patient's history were reviewed and updated as appropriate: allergies, current medications, past family history, past medical history, past social history, past surgical history and problem list. Problem list updated.  Objective:   Vitals:   03/15/18 0927  BP: 112/64  Pulse: 66  Weight: 172 lb 14.4 oz (78.4 kg)    Fetal Status: Fetal Heart Rate (bpm): NST   Movement: Present     General:  Alert, oriented and cooperative. Patient is in no acute distress.  Skin: Skin is warm and dry. No rash noted.   Cardiovascular: Normal heart rate noted  Respiratory: Normal respiratory effort, no problems with respiration noted  Abdomen: Soft, gravid, appropriate for gestational age. Pain/Pressure: Present     Pelvic:  Cervical exam deferred        Extremities: Normal range of motion.     Mental Status: Normal mood and affect. Normal behavior. Normal judgment and thought content.   Urinalysis:      Assessment and Plan:  Pregnancy: [id number] at [redacted]w[redacted]d  1. Supervision of high risk pregnancy, antepartum Routine care.   2. GDM, class A2 Normal BS values on glyburide qhs. rNST and  normal AFI today. Borderline high 6/4 u/s (EFW 88%, 3367g, AC 96%).   3. AMA (advanced maternal age) multigravida 59+, first trimester No issues  4. Unstable fetal lie, single or unspecified fetus Transverse, head maternal right and back down. For IOL on 6/20. I told pt to hold her 6/19 qhs glyburide and be npo after MN. Pt prefers ecv. D/w her that if ecv is unsuccessful then would be for c-section. If for c-section. Ask pt if she would like btl (pt has no insurance)  Term labor symptoms and general obstetric precautions including but not limited to vaginal bleeding, contractions, leaking of fluid and fetal movement were reviewed in detail with the patient. Please refer to After Visit Summary for other counseling recommendations.  Return in about 6 weeks (around 04/26/2018) for PP visit and 2hr GTT.  IOL on 6/20.   Aletha Halim, MD

## 2018-03-17 ENCOUNTER — Inpatient Hospital Stay (HOSPITAL_COMMUNITY): Payer: Medicaid Other | Admitting: Anesthesiology

## 2018-03-17 ENCOUNTER — Encounter (HOSPITAL_COMMUNITY): Payer: Self-pay

## 2018-03-17 ENCOUNTER — Inpatient Hospital Stay (HOSPITAL_COMMUNITY)
Admission: RE | Admit: 2018-03-17 | Discharge: 2018-03-19 | DRG: 807 | Disposition: A | Discharge status: Home / Self Care | Payer: Medicaid Other | Attending: Obstetrics and Gynecology | Admitting: Obstetrics and Gynecology

## 2018-03-17 VITALS — BP 94/57 | HR 59 | Temp 98.6°F | Resp 18 | Ht 63.0 in | Wt 172.2 lb

## 2018-03-17 DIAGNOSIS — O09299 Supervision of pregnancy with other poor reproductive or obstetric history, unspecified trimester: Secondary | ICD-10-CM

## 2018-03-17 DIAGNOSIS — O24425 Gestational diabetes mellitus in childbirth, controlled by oral hypoglycemic drugs: Secondary | ICD-10-CM | POA: Diagnosis present

## 2018-03-17 DIAGNOSIS — Z8759 Personal history of other complications of pregnancy, childbirth and the puerperium: Secondary | ICD-10-CM

## 2018-03-17 DIAGNOSIS — O24419 Gestational diabetes mellitus in pregnancy, unspecified control: Secondary | ICD-10-CM

## 2018-03-17 DIAGNOSIS — O09521 Supervision of elderly multigravida, first trimester: Secondary | ICD-10-CM | POA: Diagnosis present

## 2018-03-17 DIAGNOSIS — O0990 Supervision of high risk pregnancy, unspecified, unspecified trimester: Secondary | ICD-10-CM

## 2018-03-17 DIAGNOSIS — O320XX0 Maternal care for unstable lie, not applicable or unspecified: Secondary | ICD-10-CM | POA: Diagnosis present

## 2018-03-17 DIAGNOSIS — Z3A39 39 weeks gestation of pregnancy: Secondary | ICD-10-CM

## 2018-03-17 LAB — CBC
HCT: 36.5 % (ref 36.0–46.0)
Hemoglobin: 13 g/dL (ref 12.0–15.0)
MCH: 36.1 pg — ABNORMAL HIGH (ref 26.0–34.0)
MCHC: 35.6 g/dL (ref 30.0–36.0)
MCV: 101.4 fL — ABNORMAL HIGH (ref 78.0–100.0)
PLATELETS: 130 10*3/uL — AB (ref 150–400)
RBC: 3.6 MIL/uL — ABNORMAL LOW (ref 3.87–5.11)
RDW: 13.9 % (ref 11.5–15.5)
WBC: 5.2 10*3/uL (ref 4.0–10.5)

## 2018-03-17 LAB — GLUCOSE, CAPILLARY
Glucose-Capillary: 101 mg/dL — ABNORMAL HIGH (ref 65–99)
Glucose-Capillary: 108 mg/dL — ABNORMAL HIGH (ref 65–99)
Glucose-Capillary: 127 mg/dL — ABNORMAL HIGH (ref 65–99)
Glucose-Capillary: 84 mg/dL (ref 65–99)

## 2018-03-17 LAB — TYPE AND SCREEN
ABO/RH(D): B POS
Antibody Screen: NEGATIVE

## 2018-03-17 LAB — RPR: RPR: NONREACTIVE

## 2018-03-17 MED ORDER — LACTATED RINGERS IV SOLN
INTRAVENOUS | Status: DC
  Administered 2018-03-17 (×2): via INTRAVENOUS

## 2018-03-17 MED ORDER — FLEET ENEMA 7-19 GM/118ML RE ENEM: 1.0000 | ENEMA | RECTAL | Status: DC | PRN

## 2018-03-17 MED ORDER — FENTANYL 2.5 MCG/ML BUPIVACAINE 1/10 % EPIDURAL INFUSION (WH - ANES)
14.0000 mL/h | INTRAMUSCULAR | Status: DC | PRN
  Administered 2018-03-17: 12 mL/h via EPIDURAL
  Filled 2018-03-17: qty 100

## 2018-03-17 MED ORDER — LACTATED RINGERS IV SOLN
500.0000 mL | INTRAVENOUS | Status: DC | PRN
  Administered 2018-03-17: 500 mL via INTRAVENOUS

## 2018-03-17 MED ORDER — OXYTOCIN BOLUS FROM INFUSION
500.0000 mL | Freq: Once | INTRAVENOUS | Status: AC
  Administered 2018-03-17: 500 mL via INTRAVENOUS

## 2018-03-17 MED ORDER — MISOPROSTOL 200 MCG PO TABS: 800.0000 ug | ORAL_TABLET | Freq: Once | ORAL | Status: DC

## 2018-03-17 MED ORDER — EPHEDRINE 5 MG/ML INJ
10.0000 mg | INTRAVENOUS | Status: DC | PRN
  Filled 2018-03-17: qty 2

## 2018-03-17 MED ORDER — MISOPROSTOL 25 MCG QUARTER TABLET
25.0000 ug | ORAL_TABLET | ORAL | Status: DC | PRN
  Filled 2018-03-17: qty 1

## 2018-03-17 MED ORDER — SOD CITRATE-CITRIC ACID 500-334 MG/5ML PO SOLN: 30.0000 mL | ORAL | Status: DC | PRN

## 2018-03-17 MED ORDER — OXYTOCIN 40 UNITS IN LACTATED RINGERS INFUSION - SIMPLE MED: 10.0000 [IU]/h | INTRAVENOUS | Status: DC

## 2018-03-17 MED ORDER — PHENYLEPHRINE 40 MCG/ML (10ML) SYRINGE FOR IV PUSH (FOR BLOOD PRESSURE SUPPORT)
80.0000 ug | PREFILLED_SYRINGE | INTRAVENOUS | Status: DC | PRN
  Filled 2018-03-17: qty 5

## 2018-03-17 MED ORDER — LACTATED RINGERS IV SOLN
500.0000 mL | Freq: Once | INTRAVENOUS | Status: AC
  Administered 2018-03-17: 500 mL via INTRAVENOUS

## 2018-03-17 MED ORDER — OXYCODONE-ACETAMINOPHEN 5-325 MG PO TABS: 2.0000 | ORAL_TABLET | ORAL | Status: DC | PRN

## 2018-03-17 MED ORDER — OXYTOCIN 40 UNITS IN LACTATED RINGERS INFUSION - SIMPLE MED: 2.5000 [IU]/h | INTRAVENOUS | Status: DC

## 2018-03-17 MED ORDER — LACTATED RINGERS IV SOLN: 500.0000 mL | Freq: Once | INTRAVENOUS | Status: DC

## 2018-03-17 MED ORDER — PHENYLEPHRINE 40 MCG/ML (10ML) SYRINGE FOR IV PUSH (FOR BLOOD PRESSURE SUPPORT)
80.0000 ug | PREFILLED_SYRINGE | INTRAVENOUS | Status: DC | PRN
  Filled 2018-03-17: qty 5
  Filled 2018-03-17: qty 10

## 2018-03-17 MED ORDER — LIDOCAINE HCL (PF) 1 % IJ SOLN
30.0000 mL | INTRAMUSCULAR | Status: DC | PRN
  Filled 2018-03-17: qty 30

## 2018-03-17 MED ORDER — DIPHENHYDRAMINE HCL 50 MG/ML IJ SOLN: 12.5000 mg | INTRAMUSCULAR | Status: DC | PRN

## 2018-03-17 MED ORDER — METHYLERGONOVINE MALEATE 0.2 MG/ML IJ SOLN
0.2000 mg | Freq: Once | INTRAMUSCULAR | Status: AC
Start: 2018-03-17 — End: 2018-03-17
  Administered 2018-03-17: 0.2 mg via INTRAMUSCULAR

## 2018-03-17 MED ORDER — CARBOPROST TROMETHAMINE 250 MCG/ML IM SOLN: 250.0000 ug | INTRAMUSCULAR | Status: DC | PRN

## 2018-03-17 MED ORDER — OXYCODONE-ACETAMINOPHEN 5-325 MG PO TABS: 1.0000 | ORAL_TABLET | ORAL | Status: DC | PRN

## 2018-03-17 MED ORDER — TERBUTALINE SULFATE 1 MG/ML IJ SOLN
0.2500 mg | Freq: Once | INTRAMUSCULAR | Status: DC | PRN
  Filled 2018-03-17: qty 1

## 2018-03-17 MED ORDER — ACETAMINOPHEN 325 MG PO TABS: 650.0000 mg | ORAL_TABLET | ORAL | Status: DC | PRN

## 2018-03-17 MED ORDER — LIDOCAINE HCL (PF) 1 % IJ SOLN
INTRAMUSCULAR | Status: DC | PRN
  Administered 2018-03-17: 3 mL via EPIDURAL
  Administered 2018-03-17: 2 mL via EPIDURAL
  Administered 2018-03-17: 5 mL via EPIDURAL

## 2018-03-17 MED ORDER — FENTANYL CITRATE (PF) 100 MCG/2ML IJ SOLN: 100.0000 ug | INTRAMUSCULAR | Status: DC | PRN

## 2018-03-17 MED ORDER — MISOPROSTOL 50MCG HALF TABLET
50.0000 ug | ORAL_TABLET | ORAL | Status: DC
  Administered 2018-03-17 (×2): 50 ug via ORAL
  Filled 2018-03-17 (×5): qty 1

## 2018-03-17 MED ORDER — OXYTOCIN 40 UNITS IN LACTATED RINGERS INFUSION - SIMPLE MED
1.0000 m[IU]/min | INTRAVENOUS | Status: DC
  Administered 2018-03-17: 2 m[IU]/min via INTRAVENOUS
  Filled 2018-03-17: qty 1000

## 2018-03-17 MED ORDER — ONDANSETRON HCL 4 MG/2ML IJ SOLN: 4.0000 mg | Freq: Four times a day (QID) | INTRAMUSCULAR | Status: DC | PRN

## 2018-03-17 NOTE — Anesthesia Pain Management Evaluation Note (Signed)
  CRNA Pain Management Visit Note  Patient: Lynn Burnett, 37 y.o., female  "Hello I am a member of the anesthesia team at [facility]. We have an anesthesia team available at all times to provide care throughout the hospital, including epidural management and anesthesia for C-section. I don't know your plan for the delivery whether it a natural birth, water birth, IV sedation, nitrous supplementation, doula or epidural, but we want to meet your pain goals."   1.Was your pain managed to your expectations on prior hospitalizations?   Yes   2.What is your expectation for pain management during this hospitalization?     Epidural  3.How can we help you reach that goal? epidural  Record the patient's initial score and the patient's pain goal.   Pain: 0  Pain Goal: 4 [facility] wants you to be able to say your pain was always managed very well.  Chevez Sambrano 03/17/2018

## 2018-03-17 NOTE — Anesthesia Procedure Notes (Signed)
Epidural Patient location during procedure: OB Start time: 03/17/2018 3:28 PM End time: 03/17/2018 3:33 PM  Staffing Anesthesiologist: Catalina Gravel, MD Performed: anesthesiologist   Preanesthetic Checklist Completed: patient identified, pre-op evaluation, timeout performed, IV checked, risks and benefits discussed and monitors and equipment checked  Epidural Patient position: sitting Prep: DuraPrep Patient monitoring: blood pressure and continuous pulse ox Approach: midline Location: L3-L4 Injection technique: LOR air  Needle:  Needle type: Tuohy  Needle gauge: 17 G Needle length: 9 cm Needle insertion depth: 4 cm Catheter size: 19 Gauge Catheter at skin depth: 10 cm Test dose: negative and Other (1% Lidocaine)  Additional Notes Patient identified.  Risk benefits discussed including failed block, incomplete pain control, headache, nerve damage, paralysis, blood pressure changes, nausea, vomiting, reactions to medication both toxic or allergic, and postpartum back pain.  Patient expressed understanding and wished to proceed.  All questions were answered.  Sterile technique used throughout procedure and epidural site dressed with sterile barrier dressing. No paresthesia or other complications noted. The patient did not experience any signs of intravascular injection such as tinnitus or metallic taste in mouth nor signs of intrathecal spread such as rapid motor block. Please see nursing notes for vital signs. Reason for block:procedure for pain

## 2018-03-17 NOTE — Progress Notes (Signed)
Faculty Note  Patient with unstable lie, scanned at bedside prior to induction today. She is cephalic.  Will start induction.   Feliz Beam, M.D. Attending [place], [facility] for [employer], [place]

## 2018-03-17 NOTE — Anesthesia Preprocedure Evaluation (Signed)
Anesthesia Evaluation  Patient identified by MRN, date of birth, ID band Patient awake    Reviewed: Allergy & Precautions, NPO status , Patient's Chart, lab work & pertinent test results  Airway Mallampati: II  TM Distance: >3 FB Neck ROM: Full    Dental  (+) Teeth Intact, Dental Advisory Given   Pulmonary neg pulmonary ROS,    Pulmonary exam normal breath sounds clear to auscultation       Cardiovascular negative cardio ROS Normal cardiovascular exam Rhythm:Regular Rate:Normal     Neuro/Psych negative neurological ROS     GI/Hepatic negative GI ROS, Neg liver ROS,   Endo/Other  diabetes, Gestational, Oral Hypoglycemic AgentsObesity   Renal/GU negative Renal ROS     Musculoskeletal negative musculoskeletal ROS (+)   Abdominal   Peds  Hematology  (+) Blood dyscrasia (Plt 130k), ,   Anesthesia Other Findings Day of surgery medications reviewed with the patient.  Reproductive/Obstetrics (+) Pregnancy                             Anesthesia Physical Anesthesia Plan  ASA: III  Anesthesia Plan: Epidural   Post-op Pain Management:    Induction:   PONV Risk Score and Plan: 2 and Treatment may vary due to age or medical condition  Airway Management Planned:   Additional Equipment:   Intra-op Plan:   Post-operative Plan:   Informed Consent: I have reviewed the patients History and Physical, chart, labs and discussed the procedure including the risks, benefits and alternatives for the proposed anesthesia with the patient or authorized representative who has indicated his/her understanding and acceptance.   Dental advisory given  Plan Discussed with:   Anesthesia Plan Comments: (Patient identified. Risks/Benefits/Options discussed with patient including but not limited to bleeding, infection, nerve damage, paralysis, failed block, incomplete pain control, headache, blood pressure  changes, nausea, vomiting, reactions to medication both or allergic, itching and postpartum back pain. Confirmed with bedside nurse the patient's most recent platelet count. Confirmed with patient that they are not currently taking any anticoagulation, have any bleeding history or any family history of bleeding disorders. Patient expressed understanding and wished to proceed. All questions were answered. )        Anesthesia Quick Evaluation

## 2018-03-17 NOTE — Progress Notes (Signed)
LABOR PROGRESS NOTE  Lynn Burnett is a 37 y.o. [phone] at [redacted]w[redacted]d  admitted for IOL for A2GDM  Subjective: Pt is comfortable, no complaints  Objective: BP 102/68   Pulse 62   Temp 98 F (36.7 C) (Oral)   Resp 18   Ht 5\' 3"  (1.6 m)   Wt 172 lb 3.2 oz (78.1 kg)   LMP 06/17/2017   SpO2 95%   BMI 30.50 kg/m  or  Vitals:   03/17/18 2030 03/17/18 2105 03/17/18 2132 03/17/18 2201  BP: 115/67 (!) 90/50 112/69 102/68  Pulse: (!) 55 (!) 50 64 62  Resp: 18 18 18 18   Temp:  98 F (36.7 C)    TempSrc:  Oral    SpO2:      Weight:      Height:         Dilation: 10 Dilation Complete Date: 03/17/18 Dilation Complete Time: 1937 Effacement (%): 100 Cervical Position: Posterior Station: Plus 1 Presentation: Vertex Exam by:: Renato Battles, CNM FHT: 140, variables and early, accel Uterine activity: 2-19min  Labs: Lab Results  Component Value Date   WBC 5.2 03/17/2018   HGB 13.0 03/17/2018   HCT 36.5 03/17/2018   MCV 101.4 (H) 03/17/2018   PLT 130 (L) 03/17/2018    Patient Active Problem List   Diagnosis Date Noted  . Gestational diabetes mellitus 03/17/2018  . Unstable lie of fetus 03/15/2018  . Umbilical hernia 66/59/9357  . AMA (advanced maternal age) multigravida 30+, first trimester 09/14/2017  . History of oligohydramnios in prior pregnancy, currently pregnant 09/14/2017  . History of delivery by vacuum extraction, currently pregnant 09/14/2017  . Supervision of high risk pregnancy, antepartum 09/13/2017  . History of maternal fourth degree perineal laceration, currently pregnant 05/19/2014  . GDM, class A2 06/17/2011    Assessment / Plan: 37 y.o. [id number] at [redacted]w[redacted]d here for IOL for HTN  Labor: active labor, s/p AROM, pit d/c due to fetal intolerance. Slow progression with pushing Fetal Wellbeing:  Cat I, but does have decels with contractions, tried multiple positions O2, allowing to rest right not Pain Control:  epidural Anticipated MOD:  Guarded SVD  Bonnita Hollow, MD PGY-1 6/20/201910:37 PM

## 2018-03-17 NOTE — H&P (Signed)
LABOR AND DELIVERY ADMISSION HISTORY AND PHYSICAL NOTE  Lynn Burnett is a 37 y.o. female [phone] with IUP at [redacted]w[redacted]d by LMP presenting for IOL for [place].  She reports positive fetal movement. She denies leakage of fluid or vaginal bleeding.  Prenatal History/Complications: PNC at [place] Pregnancy complications:  - AMA, GDMA2, unstable lie in this pregnancy (cephalic presentation confirmed by Dr. Rosana Burnett 6/20), h/o VAVD x 2, h/o 4th degree perineal laceration, h/o SAB  Past Medical History: Past Medical History:  Diagnosis Date  . Diabetes mellitus    prediabetes per MD no meds  . Gestational diabetes    glyburide  . History of gestational diabetes 03/01/2012   Diagnosed around 9 weeks in last pregnancy [ ]  2 hr GTT postpartum    . Umbilical hernia     Past Surgical History: Past Surgical History:  Procedure Laterality Date  . NO PAST SURGERIES      Obstetrical History: OB History    Gravida  4   Para  2   Term  2   Preterm  0   AB  1   Living  2     SAB  1   TAB  0   Ectopic  0   Multiple      Live Births  2           Social History: Social History   Socioeconomic History  . Marital status: Married    Spouse name: Not on file  . Number of children: Not on file  . Years of education: Not on file  . Highest education level: Not on file  Occupational History  . Not on file  Social Needs  . Financial resource strain: Not on file  . Food insecurity:    Worry: Not on file    Inability: Not on file  . Transportation needs:    Medical: Not on file    Non-medical: Not on file  Tobacco Use  . Smoking status: Never Smoker  . Smokeless tobacco: Never Used  Substance and Sexual Activity  . Alcohol use: No  . Drug use: No  . Sexual activity: Yes    Birth control/protection: None  Lifestyle  . Physical activity:    Days per week: Not on file    Minutes per session: Not on file  . Stress: Not on file  Relationships  . Social connections:    Talks on phone: Not on file    Gets together: Not on file    Attends religious service: Not on file    Active member of club or organization: Not on file    Attends meetings of clubs or organizations: Not on file    Relationship status: Not on file  Other Topics Concern  . Not on file  Social History Narrative   ** Merged History Encounter **        Family History: Family History  Problem Relation Age of Onset  . Cancer Mother        cervical  . Miscarriages / Stillbirths Mother   . Cancer Maternal Aunt   . Anesthesia problems Neg Hx     Allergies: No Known Allergies  Medications Prior to Admission  Medication Sig Dispense Refill Last Dose  . ACCU-CHEK FASTCLIX LANCETS MISC 1 Units by Percutaneous route 4 (four) times daily. 100 each 12 Taking  . acetaminophen (TYLENOL) 500 MG tablet Take 500 mg by mouth 2 (two) times daily as needed for mild pain.   Not  Taking  . aspirin EC 81 MG tablet Take 1 tablet (81 mg total) by mouth daily. Take after 12 weeks for prevention of preeclampsia later in pregnancy (Patient not taking: Reported on 03/08/2018) 300 tablet 2 Not Taking  . glucose blood test strip Use as instructed 100 each 12 Taking  . glyBURIDE (DIABETA) 2.5 MG tablet Take 1 tablet (2.5 mg total) by mouth at bedtime. 30 tablet 3 Taking  . loratadine (CLARITIN) 10 MG tablet Take 10 mg by mouth daily as needed for allergies.   Not Taking  . Prenatal Vit-Fe Fumarate-FA (PRENATAL MULTIVITAMIN) TABS Take 1 tablet by mouth daily.   Taking     Review of Systems  All systems reviewed and negative except as stated in HPI  Physical Exam Blood pressure 113/68, pulse (!) 57, temperature 97.6 F (36.4 C), temperature source Oral, resp. rate 18, height 5\' 3"  (1.6 m), weight 172 lb 3.2 oz (78.1 kg), last menstrual period 06/17/2017, unknown if currently breastfeeding. General appearance: alert, cooperative and no distress Lungs: clear to auscultation bilaterally Heart: regular rate and  rhythm Abdomen: soft, non-tender; bowel sounds normal Extremities: No calf swelling or tenderness Presentation: cephalic presentation confirmed by BS U/S by Dr. Rosana Burnett Fetal monitoring: 135/moderate variability/accels present/no decels Uterine activity: Occ UC's with UI Dilation: 1 Effacement (%): Thick Station: -3 Exam by:: Lynn Burnett, CNM  Prenatal labs: ABO, Rh: B/Positive/-- (12/18 1035) Antibody: Negative (12/18 1035) Rubella: 15.00 (12/18 1035) RPR: Non Reactive (04/02 0934)  HBsAg: Negative (12/18 1035)  HIV: Non Reactive (04/02 0934)  GC/Chlamydia: Neg/Neg GBS:  Negative Early GTT: abnormal Genetic screening:  declined Anatomy US: normal  Prenatal Transfer Tool  Maternal Diabetes: Yes:  Diabetes Type:  Diet controlled Genetic Screening: Declined Maternal Ultrasounds/Referrals: Normal Fetal Ultrasounds or other Referrals:  None Maternal Substance Abuse:  No Significant Maternal Medications:  None Significant Maternal Lab Results: Lab values include: Group B Strep negative  Results for orders placed or performed during the hospital encounter of 03/17/18 (from the past 24 hour(s))  Glucose, capillary   Collection Time: 03/17/18  8:02 AM  Result Value Ref Range   Glucose-Capillary 101 (H) 65 - 99 mg/dL  CBC   Collection Time: 03/17/18  8:04 AM  Result Value Ref Range   WBC 5.2 4.0 - 10.5 K/uL   RBC 3.60 (L) 3.87 - 5.11 MIL/uL   Hemoglobin 13.0 12.0 - 15.0 g/dL   HCT 36.5 36.0 - 46.0 %   MCV 101.4 (H) 78.0 - 100.0 fL   MCH 36.1 (H) 26.0 - 34.0 pg   MCHC 35.6 30.0 - 36.0 g/dL   RDW 13.9 11.5 - 15.5 %   Platelets 130 (L) 150 - 400 K/uL    Patient Active Problem List   Diagnosis Date Noted  . Gestational diabetes mellitus 03/17/2018  . Unstable lie of fetus 03/15/2018  . Umbilical hernia 22/29/7989  . AMA (advanced maternal age) multigravida 63+, first trimester 09/14/2017  . History of oligohydramnios in prior pregnancy, currently pregnant 09/14/2017  .  History of delivery by vacuum extraction, currently pregnant 09/14/2017  . Supervision of high risk pregnancy, antepartum 09/13/2017  . History of maternal fourth degree perineal laceration, currently pregnant 05/19/2014  . GDM, class A2 06/17/2011    Assessment: Lynn Hollander is a 37 y.o. [id number] at [redacted]w[redacted]d here for IOL for [place]  #Labor: none. Cytotec 50 mcg placed buccally #Pain: None at this time. Planning epidural. #FWB: Category I #ID:  n/a #MOF: breast #MOC: condoms #Circ:  NO  Laury Deep, MSN, CNM 03/17/2018, 9:18 AM

## 2018-03-18 ENCOUNTER — Encounter (HOSPITAL_COMMUNITY): Payer: Self-pay

## 2018-03-18 LAB — CBC
HCT: 36.8 % (ref 36.0–46.0)
HEMATOCRIT: 40.3 % (ref 36.0–46.0)
Hemoglobin: 13 g/dL (ref 12.0–15.0)
Hemoglobin: 14 g/dL (ref 12.0–15.0)
MCH: 35.6 pg — ABNORMAL HIGH (ref 26.0–34.0)
MCH: 35.9 pg — AB (ref 26.0–34.0)
MCHC: 34.7 g/dL (ref 30.0–36.0)
MCHC: 35.3 g/dL (ref 30.0–36.0)
MCV: 101.7 fL — AB (ref 78.0–100.0)
MCV: 102.5 fL — AB (ref 78.0–100.0)
Platelets: 117 10*3/uL — ABNORMAL LOW (ref 150–400)
Platelets: 118 10*3/uL — ABNORMAL LOW (ref 150–400)
RBC: 3.62 MIL/uL — AB (ref 3.87–5.11)
RBC: 3.93 MIL/uL (ref 3.87–5.11)
RDW: 13.7 % (ref 11.5–15.5)
RDW: 13.8 % (ref 11.5–15.5)
WBC: 14.6 10*3/uL — AB (ref 4.0–10.5)
WBC: 9.8 10*3/uL (ref 4.0–10.5)

## 2018-03-18 LAB — GLUCOSE, CAPILLARY: GLUCOSE-CAPILLARY: 125 mg/dL — AB (ref 65–99)

## 2018-03-18 MED ORDER — SIMETHICONE 80 MG PO CHEW: 80.0000 mg | CHEWABLE_TABLET | ORAL | Status: DC | PRN

## 2018-03-18 MED ORDER — WITCH HAZEL-GLYCERIN EX PADS: 1.0000 "application " | MEDICATED_PAD | CUTANEOUS | Status: DC | PRN

## 2018-03-18 MED ORDER — IBUPROFEN 600 MG PO TABS
600.0000 mg | ORAL_TABLET | Freq: Four times a day (QID) | ORAL | Status: DC
  Administered 2018-03-18 – 2018-03-19 (×5): 600 mg via ORAL
  Filled 2018-03-18 (×5): qty 1

## 2018-03-18 MED ORDER — OXYCODONE HCL 5 MG PO TABS: 5.0000 mg | ORAL_TABLET | ORAL | Status: DC | PRN

## 2018-03-18 MED ORDER — ONDANSETRON HCL 4 MG/2ML IJ SOLN: 4.0000 mg | INTRAMUSCULAR | Status: DC | PRN

## 2018-03-18 MED ORDER — COCONUT OIL OIL
1.0000 "application " | TOPICAL_OIL | Status: DC | PRN
  Administered 2018-03-18: 1 via TOPICAL
  Filled 2018-03-18: qty 120

## 2018-03-18 MED ORDER — DIPHENHYDRAMINE HCL 25 MG PO CAPS: 25.0000 mg | ORAL_CAPSULE | Freq: Four times a day (QID) | ORAL | Status: DC | PRN

## 2018-03-18 MED ORDER — ONDANSETRON HCL 4 MG PO TABS: 4.0000 mg | ORAL_TABLET | ORAL | Status: DC | PRN

## 2018-03-18 MED ORDER — SENNOSIDES-DOCUSATE SODIUM 8.6-50 MG PO TABS
2.0000 | ORAL_TABLET | ORAL | Status: DC
  Administered 2018-03-18 – 2018-03-19 (×2): 2 via ORAL
  Filled 2018-03-18 (×2): qty 2

## 2018-03-18 MED ORDER — DIBUCAINE 1 % RE OINT: 1.0000 "application " | TOPICAL_OINTMENT | RECTAL | Status: DC | PRN

## 2018-03-18 MED ORDER — OXYCODONE HCL 5 MG PO TABS: 10.0000 mg | ORAL_TABLET | ORAL | Status: DC | PRN

## 2018-03-18 MED ORDER — PRENATAL MULTIVITAMIN CH
1.0000 | ORAL_TABLET | Freq: Every day | ORAL | Status: DC
  Administered 2018-03-18: 1 via ORAL
  Filled 2018-03-18: qty 1

## 2018-03-18 MED ORDER — BENZOCAINE-MENTHOL 20-0.5 % EX AERO
1.0000 "application " | INHALATION_SPRAY | CUTANEOUS | Status: DC | PRN
  Administered 2018-03-18: 1 via TOPICAL
  Filled 2018-03-18: qty 56

## 2018-03-18 MED ORDER — TETANUS-DIPHTH-ACELL PERTUSSIS 5-2.5-18.5 LF-MCG/0.5 IM SUSP: 0.5000 mL | Freq: Once | INTRAMUSCULAR | Status: DC

## 2018-03-18 MED ORDER — ZOLPIDEM TARTRATE 5 MG PO TABS: 5.0000 mg | ORAL_TABLET | Freq: Every evening | ORAL | Status: DC | PRN

## 2018-03-18 MED ORDER — ACETAMINOPHEN 325 MG PO TABS
650.0000 mg | ORAL_TABLET | ORAL | Status: DC | PRN
  Administered 2018-03-18 (×2): 650 mg via ORAL
  Filled 2018-03-18 (×2): qty 2

## 2018-03-18 NOTE — Anesthesia Postprocedure Evaluation (Signed)
Anesthesia Post Note  Patient: Lynn Burnett  Procedure(s) Performed: AN AD [place]     Patient location during evaluation: Mother Baby Anesthesia Type: Epidural Level of consciousness: awake and alert Pain management: pain level controlled Vital Signs Assessment: post-procedure vital signs reviewed and stable Respiratory status: spontaneous breathing, nonlabored ventilation and respiratory function stable Cardiovascular status: stable Postop Assessment: no headache, no backache, epidural receding, no apparent nausea or vomiting, patient able to bend at knees, able to ambulate and adequate PO intake Anesthetic complications: no    Last Vitals:  Vitals:   03/18/18 0314 03/18/18 0724  BP: 96/62 99/70  Pulse: 63 (!) 58  Resp: 18 18  Temp: 36.8 C 37.2 C  SpO2: 97% 97%    Last Pain:  Vitals:   03/18/18 0724  TempSrc: Oral  PainSc: 3    Pain Goal:                 Jabier Mutton

## 2018-03-18 NOTE — Progress Notes (Signed)
Post Partum Day 1 Subjective: no complaints, up ad lib, voiding, tolerating PO and + flatus  Objective: Blood pressure 96/62, pulse 63, temperature 98.3 F (36.8 C), resp. rate 18, height 5\' 3"  (1.6 m), weight 172 lb 3.2 oz (78.1 kg), last menstrual period 06/17/2017, SpO2 97 %, unknown if currently breastfeeding.  Physical Exam:  General: alert, cooperative and no distress Lochia: appropriate Uterine Fundus: firm Incision: n/a DVT Evaluation: No evidence of DVT seen on physical exam.  Recent Labs    03/18/18 0012 03/18/18 0518  HGB 14.0 13.0  HCT 40.3 36.8    Assessment/Plan: Plan for discharge tomorrow   LOS: 1 day   Bonnita Hollow 03/18/2018, 7:23 AM

## 2018-03-18 NOTE — Progress Notes (Signed)
Post Partum Day 1 Subjective: no complaints, up ad lib, voiding and tolerating PO  Objective: Blood pressure 99/70, pulse (!) 58, temperature 98.9 F (37.2 C), temperature source Oral, resp. rate 18, height 5\' 3"  (1.6 m), weight 172 lb 3.2 oz (78.1 kg), last menstrual period 06/17/2017, SpO2 97 %, unknown if currently breastfeeding.  Physical Exam:  General: alert, cooperative and appears stated age 37: appropriate Uterine Fundus: firm DVT Evaluation: No evidence of DVT seen on physical exam.  Recent Labs    03/18/18 0012 03/18/18 0518  HGB 14.0 13.0  HCT 40.3 36.8    Assessment/Plan: Plan for discharge tomorrow, Breastfeeding and Contraception condoms   LOS: 1 day   Lynn Burnett 03/18/2018, 3:24 PM

## 2018-03-18 NOTE — Progress Notes (Signed)
Dr. Rosana Hoes notified regarding patient fundal assessment at 5/U, firm with massage. Patient voided prior and states she feels as she has emptied her bladder. No significant bleeding at assessment. Slight pain. Dr. Rosana Hoes says an attending will come by to see patient.  She also complains of heaviness in right leg, able to ambulate but very cautious. Night RN says it is improving. Will continue to monitor. Jackson Latino, RN     03/18/18 0724  Fundal Assessment  Fundal Tone Firm with massage  Fundal Position Midline  Fundus 3/U (5 fingers above)  Lochia  Color Rubra  Amount Small  Odor None  Clots None  Epidural/Spinal Function  Cutaneous sensation Normal sensation (right leg still heavy)  Patient able to flex knees Yes  Patient able to lift hips off bed Yes  Back pain beyond tenderness at insertion site No  Progressively worsening motor and/or sensory loss No  Bowel and/or bladder incontinence post epidural No

## 2018-03-18 NOTE — Lactation Note (Signed)
This note was copied from a baby's chart. Lactation Consultation Note  Patient Name: Lynn Burnett Today's Date: 03/18/2018 Reason for consult: Initial assessment;Term;Maternal endocrine disorder(GDM-Glyburide) Type of Endocrine Disorder?: Diabetes   Initial consult with Exp BF mom of 28 hour old infant. Infant with 5 BF for 15-30 minutes, 3 voids and 2 stools since birth. LATCH Scores 8-10. Infant weight 7 pounds 9.7 ounces with weight loss of 0% since birth.   Mom had infant latched to the left breast in the cradle hold. Infant was latched deeply with active suckling. Infant did unlatch at one point, mom able to relatch independently. Mom denied pain with feeding. Mom able to independently hand express colostrum. Infant still feeding actively when [facility] left the room.   Reviewed BF basics, positioning, hand expression, colostrum and milk coming to volume. Enc mom to feed infant STS 8-12 x in 24 hours at first feeding cues. Enc mom to keep infant awake at the breast and to massage/compress breast with feedings to maximize milk transfer.   Mom has BF her 50 and 67 yo sons for 2 years each. Mom is planning to return to work at 68 weeks. Mom has spoken with [facility] about a pump for use later.   BF Resources handout and [place] [employer]ure given, mom informed of IP/OP Services, BF Support Groups and [place] phone #. Mom to call out for feeding assistance as needed. Mom reports she has no questions/concerns at this time.    Maternal Data Formula Feeding for Exclusion: No Has patient been taught Hand Expression?: Yes Does the patient have breastfeeding experience prior to this delivery?: Yes  Feeding Feeding Type: Breast Fed Length of feed: 15 min(still feeding when LC left the room)  LATCH Score Latch: Grasps breast easily, tongue down, lips flanged, rhythmical sucking.  Audible Swallowing: Spontaneous and intermittent  Type of Nipple: Everted at rest and after stimulation  Comfort (Breast/Nipple):  Soft / non-tender  Hold (Positioning): No assistance needed to correctly position infant at breast.  LATCH Score: 10  Interventions Interventions: Breast feeding basics reviewed;Support pillows;Position options;Skin to skin;Breast compression;Hand express;Breast massage  Lactation Tools Discussed/Used WIC Program: Yes   Consult Status Consult Status: Follow-up Date: 03/19/18 Follow-up type: In-patient    Debby Freiberg Law Corsino 03/18/2018, 1:25 PM

## 2018-03-19 MED ORDER — IBUPROFEN 600 MG PO TABS: 600.0000 mg | ORAL_TABLET | Freq: Four times a day (QID) | ORAL | 0 refills | Status: AC

## 2018-03-19 NOTE — Lactation Note (Signed)
This note was copied from a baby's chart. Lactation Consultation Note  Patient Name: Lynn Burnett DVVOH'Y Date: 03/19/2018 Reason for consult: Follow-up assessment;Infant weight loss;Maternal endocrine disorder Type of Endocrine Disorder?: Diabetes 20 hours old  female infant  with weight loss of 5% which is acceptable per age with stools and voids. P3 Mom with  previous breastfeeding experience per mom she breastfeed  her other two sons both for 2 years each. Currently active in the [facility] Program. Per Mom she breastfeed at 8:00 am for 20 minutes prior to [facility] entering the room and infant had a  stool as well that was brown in color. Mom is comfortable using cross cradle and football she  plans to use these two positions with baby.   Manual hand pump given PRN and breast engorgement/prevention discussed. Mom was able to demonstrate by teaching  back hand expression  to [facility] and Mom  will use expressed breast milk on areola for sore nipple manangement.  Mom was fitted with a 27 breast  flange due to  left breast being larger than right . The right breast a 24 flange is a good fit. Mom pumped 87ml EBM when demonstrating hand pump usage and mom  said she was comfortable with flange sizes. LC notice when  Mom was being fitted  for hand pump with the right flange sizes  mom's breast are everted with  no nipple trauma /or damaged noted. Mom will breastfeed by hunger cues and 8 to 12 times within 24 hours Breast milk, storage and collection reviewed.  Mom aware of [place] hotline services, outpatient BF clinic, BF support group and other community resources.       Maternal Data    Feeding Feeding Type: Breast Fed Length of feed: 20 min  LATCH Score                   Interventions    Lactation Tools Discussed/Used Tools: Coconut oil;Flanges Flange Size: 27;24(Left breast is larger  Mom needed 27 flange) WIC Program: Yes Pump Review: Setup, frequency, and cleaning Initiated  by:: Vicente Serene, IBCLC Date initiated:: 03/19/18   Consult Status Consult Status: Complete Date: 03/19/18 Follow-up type: In-patient    Vicente Serene 03/19/2018, 9:23 AM

## 2018-03-19 NOTE — Discharge Summary (Addendum)
OB Discharge Summary     Patient Name: Lynn Burnett DOB: 09-18-1981 MRN: [id number]  Date of admission: 03/17/2018 Delivering MD: Sloan Leiter   Date of discharge: 03/19/2018  Admitting diagnosis: induction Intrauterine pregnancy: [redacted]w[redacted]d     Secondary diagnosis:  Active Problems:   History of maternal fourth degree perineal laceration, currently pregnant   Supervision of high risk pregnancy, antepartum   AMA (advanced maternal age) multigravida 35+, first trimester   Unstable lie of fetus   Gestational diabetes mellitus  Additional problems: None     Discharge diagnosis: Term Pregnancy Delivered and GDM A2                                                                                                Post partum procedures:none  Augmentation: AROM, Pitocin, Cytotec and Foley Balloon  Complications: None  Hospital course:  Induction of Labor With Vaginal Delivery   37 y.o. yo [phone] at [redacted]w[redacted]d was admitted to the hospital 03/17/2018 for induction of labor.  Indication for induction: A2 DM.  Patient had a labor course remarkable for becoming complete and pushing x 2h with minimal descent, as well as deep variables. She was offered a vacuum for assistance and accepted.  Membrane Rupture Time/Date: 5:35 PM ,03/17/2018   Intrapartum Procedures: Episiotomy: None [1]                                         Lacerations:  2nd degree [3]  Patient had delivery of a Viable infant.  Information for the patient's newborn:  [place], Cohenour [[id number]]  Delivery Method: Vaginal, Vacuum (Extractor)(Filed from Delivery Summary)   03/17/2018  Details of delivery can be found in separate delivery note.  Patient had a routine postpartum course. Patient is discharged home 03/19/18.  Physical exam  Vitals:   03/18/18 0314 03/18/18 0724 03/18/18 1539 03/19/18 0552  BP: 96/62 99/70 (!) 92/55 (!) 94/57  Pulse: 63 (!) 58 (!) 59 (!) 59  Resp: 18 18  18   Temp: 98.3 F (36.8 C) 98.9  F (37.2 C)  98.6 F (37 C)  TempSrc: Oral Oral  Oral  SpO2: 97% 97%    Weight:      Height:       General: alert, cooperative and no distress Lochia: appropriate Uterine Fundus: firm Incision: Healing well with no significant drainage DVT Evaluation: No evidence of DVT seen on physical exam. Labs: Lab Results  Component Value Date   WBC 14.6 (H) 03/18/2018   HGB 13.0 03/18/2018   HCT 36.8 03/18/2018   MCV 101.7 (H) 03/18/2018   PLT 118 (L) 03/18/2018   CMP Latest Ref Rng & Units 09/14/2017  Glucose 65 - 99 mg/dL 76  BUN 6 - 20 mg/dL 7  Creatinine 0.57 - 1.00 mg/dL 0.42(L)  Sodium 134 - 144 mmol/L 140  Potassium 3.5 - 5.2 mmol/L 4.3  Chloride 96 - 106 mmol/L 106  CO2 20 - 29 mmol/L 21  Calcium 8.7 - 10.2  mg/dL 8.7  Total Protein 6.0 - 8.5 g/dL 6.3  Total Bilirubin 0.0 - 1.2 mg/dL 0.2  Alkaline Phos 39 - 117 IU/L 51  AST 0 - 40 IU/L 14  ALT 0 - 32 IU/L 13    Discharge instruction: per After Visit Summary and "Baby and Me Booklet".  After visit meds:  Allergies as of 03/19/2018   No Known Allergies     Medication List    STOP taking these medications   ACCU-CHEK FASTCLIX LANCETS Misc   aspirin EC 81 MG tablet   glucose blood test strip   glyBURIDE 2.5 MG tablet Commonly known as:  DIABETA     TAKE these medications   ibuprofen 600 MG tablet Commonly known as:  ADVIL,MOTRIN Take 1 tablet (600 mg total) by mouth every 6 (six) hours.   loratadine 10 MG tablet Commonly known as:  CLARITIN Take 10 mg by mouth daily as needed for allergies.   prenatal multivitamin Tabs tablet Take 1 tablet by mouth daily.       Diet: routine diet  Activity: Advance as tolerated. Pelvic rest for 6 weeks.   Outpatient follow up:6 weeks- with GTT Follow up Appt:No future appointments. Follow up Visit:No follow-ups on file.  Postpartum contraception: Condoms  Newborn Data: Live born female  Birth Weight: 7 lb 10 oz (3459 g) APGAR: 8, 9  Newborn Delivery    Birth date/time:  03/17/2018 23:08:00 Delivery type:  Vaginal, Vacuum (Extractor)     Baby Feeding: Bottle Disposition:home with mother   03/19/2018 Bonnita Hollow, MD  CNM attestation I have seen and examined this patient and agree with above documentation in the resident's note.   Lynn Burnett is a 37 y.o. [phone] s/p VAVD.   Pain is well controlled.  Plan for birth control is condoms.  Method of Feeding: bottle  PE:  BP (!) 94/57 (BP Location: Left Arm)   Pulse (!) 59   Temp 98.6 F (37 C) (Oral)   Resp 18   Ht 5\' 3"  (1.6 m)   Wt 78.1 kg (172 lb 3.2 oz)   LMP 06/17/2017   SpO2 97%   Breastfeeding? Unknown   BMI 30.50 kg/m  Fundus firm  Recent Labs    03/18/18 0012 03/18/18 0518  HGB 14.0 13.0  HCT 40.3 36.8     Plan: discharge today - postpartum care discussed - f/u clinic in 6 weeks for postpartum visit- with GTT   Serita Grammes, CNM 9:42 AM  03/19/2018

## 2018-03-19 NOTE — Discharge Instructions (Signed)
Vaginal Delivery, Care After °Refer to this sheet in the next few weeks. These instructions provide you with information about caring for yourself after vaginal delivery. Your health care provider may also give you more specific instructions. Your treatment has been planned according to current medical practices, but problems sometimes occur. Call your health care provider if you have any problems or questions. °What can I expect after the procedure? °After vaginal delivery, it is common to have: °· Some bleeding from your vagina. °· Soreness in your abdomen, your vagina, and the area of skin between your vaginal opening and your anus (perineum). °· Pelvic cramps. °· Fatigue. ° °Follow these instructions at home: °Medicines °· Take over-the-counter and prescription medicines only as told by your health care provider. °· If you were prescribed an antibiotic medicine, take it as told by your health care provider. Do not stop taking the antibiotic until it is finished. °Driving ° °· Do not drive or operate heavy machinery while taking prescription pain medicine. °· Do not drive for 24 hours if you received a sedative. °Lifestyle °· Do not drink alcohol. This is especially important if you are breastfeeding or taking medicine to relieve pain. °· Do not use tobacco products, including cigarettes, chewing tobacco, or e-cigarettes. If you need help quitting, ask your health care provider. °Eating and drinking °· Drink at least 8 eight-ounce glasses of water every day unless you are told not to by your health care provider. If you choose to breastfeed your baby, you may need to drink more water than this. °· Eat high-fiber foods every day. These foods may help prevent or relieve constipation. High-fiber foods include: °? Whole grain cereals and breads. °? Brown rice. °? Beans. °? Fresh fruits and vegetables. °Activity °· Return to your normal activities as told by your health care provider. Ask your health care provider  what activities are safe for you. °· Rest as much as possible. Try to rest or take a nap when your baby is sleeping. °· Do not lift anything that is heavier than your baby or 10 lb (4.5 kg) until your health care provider says that it is safe. °· Talk with your health care provider about when you can engage in sexual activity. This may depend on your: °? Risk of infection. °? Rate of healing. °? Comfort and desire to engage in sexual activity. °Vaginal Care °· If you have an episiotomy or a vaginal tear, check the area every day for signs of infection. Check for: °? More redness, swelling, or pain. °? More fluid or blood. °? Warmth. °? Pus or a bad smell. °· Do not use tampons or douches until your health care provider says this is safe. °· Watch for any blood clots that may pass from your vagina. These may look like clumps of dark red, brown, or black discharge. °General instructions °· Keep your perineum clean and dry as told by your health care provider. °· Wear loose, comfortable clothing. °· Wipe from front to back when you use the toilet. °· Ask your health care provider if you can shower or take a bath. If you had an episiotomy or a perineal tear during labor and delivery, your health care provider may tell you not to take baths for a certain length of time. °· Wear a bra that supports your breasts and fits you well. °· If possible, have someone help you with household activities and help care for your baby for at least a few days after   you leave the hospital. °· Keep all follow-up visits for you and your baby as told by your health care provider. This is important. °Contact a health care provider if: °· You have: °? Vaginal discharge that has a bad smell. °? Difficulty urinating. °? Pain when urinating. °? A sudden increase or decrease in the frequency of your bowel movements. °? More redness, swelling, or pain around your episiotomy or vaginal tear. °? More fluid or blood coming from your episiotomy or  vaginal tear. °? Pus or a bad smell coming from your episiotomy or vaginal tear. °? A fever. °? A rash. °? Little or no interest in activities you used to enjoy. °? Questions about caring for yourself or your baby. °· Your episiotomy or vaginal tear feels warm to the touch. °· Your episiotomy or vaginal tear is separating or does not appear to be healing. °· Your breasts are painful, hard, or turn red. °· You feel unusually sad or worried. °· You feel nauseous or you vomit. °· You pass large blood clots from your vagina. If you pass a blood clot from your vagina, save it to show to your health care provider. Do not flush blood clots down the toilet without having your health care provider look at them. °· You urinate more than usual. °· You are dizzy or light-headed. °· You have not breastfed at all and you have not had a menstrual period for 12 weeks after delivery. °· You have stopped breastfeeding and you have not had a menstrual period for 12 weeks after you stopped breastfeeding. °Get help right away if: °· You have: °? Pain that does not go away or does not get better with medicine. °? Chest pain. °? Difficulty breathing. °? Blurred vision or spots in your vision. °? Thoughts about hurting yourself or your baby. °· You develop pain in your abdomen or in one of your legs. °· You develop a severe headache. °· You faint. °· You bleed from your vagina so much that you fill two sanitary pads in one hour. °This information is not intended to replace advice given to you by your health care provider. Make sure you discuss any questions you have with your health care provider. °Document Released: 09/11/2000 Document Revised: 02/26/2016 Document Reviewed: 09/29/2015 °Elsevier Interactive Patient Education © 2018 Elsevier Inc. ° °

## 2018-03-22 ENCOUNTER — Encounter: Payer: Self-pay | Admitting: Obstetrics & Gynecology

## 2018-03-22 ENCOUNTER — Other Ambulatory Visit: Payer: Self-pay

## 2018-04-25 ENCOUNTER — Other Ambulatory Visit: Payer: Self-pay | Admitting: *Deleted

## 2018-04-25 DIAGNOSIS — O24429 Gestational diabetes mellitus in childbirth, unspecified control: Secondary | ICD-10-CM

## 2018-04-26 ENCOUNTER — Ambulatory Visit (INDEPENDENT_AMBULATORY_CARE_PROVIDER_SITE_OTHER): Payer: Self-pay | Admitting: Student

## 2018-04-26 ENCOUNTER — Encounter: Payer: Self-pay | Admitting: Student

## 2018-04-26 ENCOUNTER — Other Ambulatory Visit: Payer: Self-pay

## 2018-04-26 DIAGNOSIS — O24429 Gestational diabetes mellitus in childbirth, unspecified control: Secondary | ICD-10-CM

## 2018-04-26 NOTE — Progress Notes (Signed)
Subjective:     Lynn Burnett is a 37 y.o. female who presents for a postpartum visit. She is 6 weeks postpartum following a spontaneous vaginal delivery. I have fully reviewed the prenatal and intrapartum course. The delivery was at 25 gestational weeks. Outcome: spontaneous vaginal delivery. Anesthesia: epidural. Postpartum course has been uneventful. Baby's course has been uneventful. Baby is feeding by breast. Bleeding no bleeding. Bowel function is normal. Bladder function is normal. Patient is not sexually active. Contraception method is none. Postpartum depression screening: negative.  The following portions of the patient's history were reviewed and updated as appropriate: allergies, current medications, past family history, past medical history, past social history, past surgical history and problem list.  Review of Systems Pertinent items are noted in HPI.   Objective:    BP 106/67   Pulse (!) 52   Ht 5\' 3"  (1.6 m)   Wt 150 lb (68 kg)   Breastfeeding? Yes   BMI 26.57 kg/m   General:  alert, cooperative and no distress   Breasts:  inspection negative, no nipple discharge or bleeding, no masses or nodularity palpable  Lungs: clear to auscultation bilaterally  Heart:  regular rate and rhythm, S1, S2 normal, no murmur, click, rub or gallop  Abdomen: soft, non-tender; bowel sounds normal; no masses,  no organomegaly   Vulva:  normal; well healed  Vagina: not evaluated  Cervix:  not evaluated  Corpus: not examined  Adnexa:  not evaluated  Rectal Exam: Normal rectovaginal exam        Assessment:     Healthy postpartum exam. Pap smear not done at today's visit.  Laceration well-healed.  Plan:    1. Contraception: condoms 2. Two hour GTT today 3. Follow up in: PRN PRN or as needed.

## 2018-04-26 NOTE — Patient Instructions (Signed)
Hemorroides Hemorrhoids Las hemorroides son venas inflamadas adentro o alrededor del recto o del ano. Hay dos tipos de hemorroides:  Hemorroides internas. Se forman en las venas del interior del recto. Pueden abultarse hacia afuera, irritarse y doler.  Hemorroides externas. Se producen en las venas externas del ano y pueden sentirse como un bulto o zona hinchada, dura y dolorosa cerca del ano.  [place] hemorroides no causan problemas graves y se Engineer, petroleum con tratamientos caseros [employer] cambios en la dieta y el estilo de vida. Si los tratamientos caseros no ayudan con los sntomas, se pueden Optometrist procedimientos para reducir o extirpar las hemorroides. Cules son las causas? La causa de esta afeccin es el aumento de la presin en la zona anal. Esta presin puede ser causada por distintos factores, por ejemplo:  Estreimiento.  Dificultad para defecar.  Diarrea.  Embarazo.  Obesidad.  Estar sentado durante largos perodos de [place].  Levantar objetos pesados u otras actividades que impliquen esfuerzo.  Sexo anal.  Cules son los signos o los sntomas? Los sntomas de esta afeccin incluyen lo siguiente:  Social research officer, government.  Picazn o irritacin anal.  Sangrado rectal.  Prdida de materia fecal (heces).  Inflamacin anal.  Uno o ms bultos alrededor del ano.  Cmo se diagnostica? Esta afeccin se diagnostica frecuentemente a travs de un examen visual. Posiblemente le realicen otros tipos de pruebas o estudios, como los siguientes:  Examen del rea rectal con una mano enguantada (examen rectal digital).  Examen del canal anal utilizando un pequeo tubo (anoscopio).  Anlisis de sangre si ha perdido [place] cantidad significativa de [place].  Un estudio para observar el interior del colon (sigmoidoscopa o colonoscopa).  Cmo se trata? Esta afeccin generalmente se puede tratar en el hogar. Se pueden realizar diversos procedimientos si los cambios en la  dieta, en el estilo de vida y otros tratamientos caseros no [employer]. Estos procedimientos pueden ayudar a reducir o [place] hemorroides completamente. Algunos de estos procedimientos son quirrgicos y otros no. Algunos de los procedimientos ms frecuentes son los siguientes:  Ligadura con Forensic psychologist. Las bandas elsticas se colocan en la base de las hemorroides para interrumpir la irrigacin de la [place].  Escleroterapia. Se inyecta un medicamento en las hemorroides para reducir su tamao.  Coagulacin con luz infrarroja. Se utiliza un tipo de energa lumnica para eliminar las hemorroides.  Hemorroidectoma. Las hemorroides se extirpan con [place] y las venas que las [place] se IT consultant.  Hemorroidopexia con grapas. Se [place] un dispositivo tipo grapa de forma circular para extirpar las hemorroides y unas grapas para cortar la sangre que se irriga hacia las hemorroides.  Siga estas indicaciones en su casa: Qu debe comer y beber  Consuma alimentos con alto contenido de [place], como cereales integrales, porotos, frutos secos, frutas y verduras. Pregntele a su mdico acerca de tomar productos con fibra aadida en ellos (complementos de fibra).  Beba suficiente lquido para Consulting civil engineer orina clara o de color amarillo plido. Control del dolor y de la hinchazn  Tome baos de asiento tibios durante 20 minutos, 3 o 4 veces por da para Glass blower/designer y las [place].  Si se lo indican, aplique hielo en la zona afectada. Usar compresas de [employer] baos de asiento puede ser [place]. ? Ponga el hielo en una bolsa plstica. ? Coloque una [employer] piel y la bolsa de hielo. ? Coloque el hielo durante 38minutos, 2 a 3veces por da. Instrucciones generales  Tome los medicamentos de venta libre y los recetados solamente como se lo haya indicado el mdico.  Aplquese los medicamentos, cremas o supositorios como se lo hayan indicado.  Haga ejercicios  regularmente.  Vaya al bao cuando sienta la necesidad de defecar. No espere.  Evite hacer fuerza al defecar.  Mantenga la zona anal limpia y seca. Use papel higinico hmedo o toallitas humedecidas despus de defecar.  No pase mucho tiempo sentado en el inodoro. Esto aumenta la afluencia de sangre y Conservation officer, historic buildings. Comunquese con un mdico si:  Aumenta el dolor y la hinchazn, y no puede controlarlos con los medicamentos o con Lexicographer.  Tiene una hemorragia que no IT consultant.  No puede defecar o lo hace con dificultad.  Siente dolor o tiene inflamacin fuera de la zona de las hemorroides. Esta informacin no tiene Marine scientist el consejo del mdico. Asegrese de hacerle al mdico cualquier pregunta que tenga. Document Released: 09/14/2005 Document Revised: 01/20/2017 Document Reviewed: 05/29/2015 Elsevier Interactive Patient Education  [employer].

## 2018-04-26 NOTE — Progress Notes (Signed)
Pt states does not want to discuss Birth Control at this time . States will use condoms for now.

## 2018-04-27 ENCOUNTER — Ambulatory Visit: Payer: Self-pay | Admitting: Family Medicine

## 2018-04-27 LAB — GLUCOSE TOLERANCE, 2 HOURS
Glucose, 2 hour: 145 mg/dL — ABNORMAL HIGH (ref 65–139)
Glucose, GTT - Fasting: 79 mg/dL (ref 65–99)

## 2018-05-03 ENCOUNTER — Telehealth: Payer: Self-pay | Admitting: General Practice

## 2018-05-03 NOTE — Telephone Encounter (Signed)
Patient called and left message stating she is returning our call for results. Per Maye Hides, patient has diabetes & should follow up at PCP.  Called patient, no answer- left message stating we are trying to reach you to return your phone call, please call us back.
